# Patient Record
Sex: Male | Born: 1970
Health system: Southern US, Community
[De-identification: ages and names within clinical notes are randomized; demographics above are authoritative.]

## PROBLEM LIST (undated history)

## (undated) DIAGNOSIS — F840 Autistic disorder: Secondary | ICD-10-CM

## (undated) DIAGNOSIS — F419 Anxiety disorder, unspecified: Secondary | ICD-10-CM

## (undated) DIAGNOSIS — F32A Depression, unspecified: Secondary | ICD-10-CM

## (undated) DIAGNOSIS — C801 Malignant (primary) neoplasm, unspecified: Secondary | ICD-10-CM

## (undated) DIAGNOSIS — F319 Bipolar disorder, unspecified: Secondary | ICD-10-CM

## (undated) HISTORY — DX: Anxiety disorder, unspecified: F41.9

## (undated) HISTORY — DX: Bipolar disorder, unspecified: F31.9

## (undated) HISTORY — PX: EYE SURGERY: SHX253

## (undated) HISTORY — DX: Malignant (primary) neoplasm, unspecified: C80.1

## (undated) HISTORY — DX: Depression, unspecified: F32.A

---

## 2009-02-21 ENCOUNTER — Ambulatory Visit: Payer: Self-pay | Admitting: Urology

## 2018-05-06 ENCOUNTER — Emergency Department (HOSPITAL_COMMUNITY)
Admission: EM | Admit: 2018-05-06 | Discharge: 2018-05-07 | Disposition: A | Discharge status: Home / Self Care | Payer: PRIVATE HEALTH INSURANCE | Attending: Emergency Medicine | Admitting: Emergency Medicine

## 2018-05-06 ENCOUNTER — Encounter (HOSPITAL_COMMUNITY): Payer: Self-pay | Admitting: Emergency Medicine

## 2018-05-06 ENCOUNTER — Other Ambulatory Visit: Payer: Self-pay

## 2018-05-06 DIAGNOSIS — F840 Autistic disorder: Secondary | ICD-10-CM | POA: Diagnosis not present

## 2018-05-06 DIAGNOSIS — R45851 Suicidal ideations: Secondary | ICD-10-CM | POA: Insufficient documentation

## 2018-05-06 DIAGNOSIS — F22 Delusional disorders: Secondary | ICD-10-CM | POA: Diagnosis present

## 2018-05-06 DIAGNOSIS — G4700 Insomnia, unspecified: Secondary | ICD-10-CM | POA: Insufficient documentation

## 2018-05-06 DIAGNOSIS — F4325 Adjustment disorder with mixed disturbance of emotions and conduct: Secondary | ICD-10-CM | POA: Insufficient documentation

## 2018-05-06 DIAGNOSIS — F312 Bipolar disorder, current episode manic severe with psychotic features: Secondary | ICD-10-CM | POA: Diagnosis present

## 2018-05-06 HISTORY — DX: Autistic disorder: F84.0

## 2018-05-06 LAB — COMPREHENSIVE METABOLIC PANEL
ALT: 38 U/L (ref 17–63)
AST: 35 U/L (ref 15–41)
Albumin: 4 g/dL (ref 3.5–5.0)
Alkaline Phosphatase: 68 U/L (ref 38–126)
Anion gap: 10 (ref 5–15)
BUN: 14 mg/dL (ref 6–20)
CO2: 24 mmol/L (ref 22–32)
Calcium: 8.4 mg/dL — ABNORMAL LOW (ref 8.9–10.3)
Chloride: 104 mmol/L (ref 101–111)
Creatinine, Ser: 0.89 mg/dL (ref 0.61–1.24)
GFR calc Af Amer: >60 mL/min (ref >60)
GFR calc non Af Amer: >60 mL/min (ref >60)
Glucose, Bld: 159 mg/dL — ABNORMAL HIGH (ref 65–99)
Potassium: 3.1 mmol/L — ABNORMAL LOW (ref 3.5–5.1)
Sodium: 138 mmol/L (ref 135–145)
Total Bilirubin: 1 mg/dL (ref 0.3–1.2)
Total Protein: 6.8 g/dL (ref 6.5–8.1)

## 2018-05-06 LAB — SALICYLATE LEVEL: Salicylate Lvl: <7 mg/dL (ref 2.8–30.0)

## 2018-05-06 LAB — CBC
HCT: 43.2 % (ref 39.0–52.0)
Hemoglobin: 14.6 g/dL (ref 13.0–17.0)
MCH: 29.3 pg (ref 26.0–34.0)
MCHC: 33.8 g/dL (ref 30.0–36.0)
MCV: 86.6 fL (ref 78.0–100.0)
Platelets: 163 10*3/uL (ref 150–400)
RBC: 4.99 MIL/uL (ref 4.22–5.81)
RDW: 12.9 % (ref 11.5–15.5)
WBC: 12.3 10*3/uL — ABNORMAL HIGH (ref 4.0–10.5)

## 2018-05-06 LAB — RAPID URINE DRUG SCREEN, HOSP PERFORMED
Amphetamines: NOT DETECTED
Barbiturates: NOT DETECTED
Benzodiazepines: NOT DETECTED
Cocaine: NOT DETECTED
Opiates: NOT DETECTED
Tetrahydrocannabinol: NOT DETECTED

## 2018-05-06 LAB — CK: Total CK: 291 U/L (ref 49–397)

## 2018-05-06 LAB — ACETAMINOPHEN LEVEL: Acetaminophen (Tylenol), Serum: <10 ug/mL — ABNORMAL LOW (ref 10–30)

## 2018-05-06 LAB — ETHANOL: Alcohol, Ethyl (B): <10 mg/dL (ref <10)

## 2018-05-06 MED ORDER — HYDROXYZINE HCL 25 MG PO TABS
25.0000 mg | ORAL_TABLET | Freq: Four times a day (QID) | ORAL | Status: DC | PRN
  Filled 2018-05-06: qty 1

## 2018-05-06 MED ORDER — OLANZAPINE 10 MG PO TABS: 10.0000 mg | ORAL_TABLET | Freq: Every day | ORAL | Status: DC

## 2018-05-06 MED ORDER — QUETIAPINE FUMARATE 100 MG PO TABS
100.0000 mg | ORAL_TABLET | Freq: Every day | ORAL | Status: DC
  Administered 2018-05-06: 100 mg via ORAL
  Filled 2018-05-06: qty 1

## 2018-05-06 MED ORDER — LORAZEPAM 1 MG PO TABS: 1.0000 mg | ORAL_TABLET | Freq: Once | ORAL | Status: DC

## 2018-05-06 MED ORDER — DIPHENHYDRAMINE HCL 25 MG PO CAPS
25.0000 mg | ORAL_CAPSULE | Freq: Once | ORAL | Status: AC
  Administered 2018-05-06: 25 mg via ORAL
  Filled 2018-05-06: qty 1

## 2018-05-06 NOTE — BH Assessment (Signed)
Tele Assessment Note   Patient Name: Steven Knight MRN: [id number] Referring Physician: Tanna Furry, MD Location of [facility]: [facility] Location of Provider: [place] is an 47 y.o. male present to [facility] accompanied by his wife and step-daughter with completes of insomnia and autistic cognitive regression. Past two weeks patient has only been sleeping in 1 or 2 hour increments within 24 hours. Stressors include recently learning after being on a new job for 3 months he has been doing it wrong. Patient was recently re-trained. His step-daughter graduating for [employer] this month and other daughter in [place]. Patient tearful has he discussed his children are now smarter than him and he feels they do not need him. Patient wife report the following symptoms of autistic cognitive regression patient has been displaying which includes; deteriorating executive functions which includes attention, problem solving, working memory, abstract thinking, monitoring internal and external stimuli and decision making. Patient ability to socialize with others his age are also decreasing or being able to cognitively express himself where others understand. Patient unaware that his behavior is a problem for others. Patient has slowly been regressing over the past year after he stopped taking his Klonopin.   Patient denies suicidal / homicidal ideations, auditory / visual hallucinations. Patient denies substance abuse, criminal history, inpatient or outpatient hospitalizations. Patient denies sees a therapist or a psychiatrist. Patient primary care doctor monitors his medication. Patient is expressing depression triggered by his autistic cognitive regression.   Diagnosis: F33.1 Major depressive disorder, Recurrent episode, Moderate  F84.0   Autism spectrum disorder via history    Past Medical History:  Past Medical History:  Diagnosis Date  . Autism      History reviewed. No pertinent surgical history.  Family History: No family history on file.  Social History:  has no tobacco, alcohol, and drug history on file.  Additional Social History:  Alcohol / Drug Use Pain Medications: see MAR Prescriptions: see MAR Over the Counter: see MAR History of alcohol / drug use?: No history of alcohol / drug abuse Longest period of sobriety (when/how long): NA  CIWA: CIWA-Ar BP: (!) 171/108 Pulse Rate: (!) 108 COWS:    Allergies: No Known Allergies  Home Medications:  (Not in a hospital admission)  OB/GYN Status:  No LMP for male patient.  General Assessment Data [facility] of [facility]: [facility] TTS Assessment: In system Is this a Tele or Face-to-Face Assessment?: Face-to-Face Is this an Initial Assessment or a Re-assessment for this encounter?: Initial Assessment Marital status: Married [place] name: N/A Is patient pregnant?: No Pregnancy Status: No Living Arrangements: Spouse/significant other Can pt return to current living arrangement?: Yes Admission Status: Voluntary Is patient capable of signing voluntary admission?: Yes Referral Source: Self/Family/Friend Insurance type: Building services engineer Care Plan Living Arrangements: Spouse/significant other Legal Guardian: Other:(self) Name of Psychiatrist: PCP - [place] Name of Therapist: pt denies   Education Status Is patient currently in school?: No Is the patient employed, unemployed or receiving disability?: Employed  Risk to self with the past 6 months Suicidal Ideation: No Has patient been a risk to self within the past 6 months prior to admission? : No Suicidal Intent: No Has patient had any suicidal intent within the past 6 months prior to admission? : No Is patient at risk for suicide?: No Suicidal Plan?: No Has patient had any suicidal plan within the past 6 months prior to admission? : No  Access to Means: No What has been your  use of drugs/alcohol within the last 12 months?: NA Previous Attempts/Gestures: No How many times?: 0(pt denies) Other Self Harm Risks: none report Triggers for Past Attempts: None known Intentional Self Injurious Behavior: None Family Suicide History: No Recent stressful life event(s): Other (Comment)(Learning New Job) Persecutory voices/beliefs?: No Depression: Yes Depression Symptoms: Despondent, Insomnia Substance abuse history and/or treatment for substance abuse?: No Suicide prevention information given to non-admitted patients: Not applicable  Risk to Others within the past 6 months Homicidal Ideation: No Does patient have any lifetime risk of violence toward others beyond the six months prior to admission? : No Thoughts of Harm to Others: No Current Homicidal Intent: No Current Homicidal Plan: No Access to Homicidal Means: No Identified Victim: N/A History of harm to others?: No Assessment of Violence: None Noted Violent Behavior Description: none known Does patient have access to weapons?: No Criminal Charges Pending?: No Does patient have a court date: No Is patient on probation?: No  Psychosis Hallucinations: None noted(pt denies ) Delusions: None noted  Mental Status Report Appearance/Hygiene: Unremarkable Eye Contact: Fair Motor Activity: Freedom of movement Speech: Logical/coherent Level of Consciousness: Other (Comment)(having hard time cognitively processing at times) Mood: Anxious, Pleasant Affect: Anxious Anxiety Level: Moderate Thought Processes: Thought Blocking Judgement: Partial Orientation: Person, Place, Time Obsessive Compulsive Thoughts/Behaviors: None  Cognitive Functioning Concentration: Decreased Memory: Recent Impaired, Remote Impaired Is patient IDD: No Is patient DD?: No Insight: Poor Impulse Control: Fair Appetite: Fair Have you had any weight changes? : No Change Sleep: Decreased Total Hours of Sleep: 2(family report pt  sleeping 1 or 2 hours in a 24 hours) Vegetative Symptoms: None  ADLScreening [facility] Assessment Services) Patient's cognitive ability adequate to safely complete daily activities?: Yes Patient able to express need for assistance with ADLs?: Yes Independently performs ADLs?: Yes (appropriate for developmental age)  Prior Inpatient Therapy Prior Inpatient Therapy: No  Prior Outpatient Therapy Prior Outpatient Therapy: No Does patient have an ACCT team?: No Does patient have Intensive In-House Services?  : No Does patient have [facility] services? : No Does patient have P4CC services?: No  ADL Screening (condition at time of admission) Patient's cognitive ability adequate to safely complete daily activities?: Yes Is the patient deaf or have difficulty hearing?: No Does the patient have difficulty seeing, even when wearing glasses/contacts?: No Does the patient have difficulty concentrating, remembering, or making decisions?: No Patient able to express need for assistance with ADLs?: Yes Does the patient have difficulty dressing or bathing?: No Independently performs ADLs?: Yes (appropriate for developmental age) Does the patient have difficulty walking or climbing stairs?: No       Abuse/Neglect Assessment (Assessment to be complete while patient is alone) Abuse/Neglect Assessment Can Be Completed: Yes Physical Abuse: Denies Verbal Abuse: Denies Sexual Abuse: Denies Exploitation of patient/patient's resources: Denies Self-Neglect: Denies     Regulatory affairs officer (For Healthcare) Does Patient Have a Medical Advance Directive?: No Would patient like information on creating a medical advance directive?: No - Patient declined          Disposition:  Disposition Initial Assessment Completed for this Encounter: Yes(Dr. Benld, observation, re-evaluate in the a.m. )  This service was provided via telemedicine using a 2-way, interactive audio and Radiographer, therapeutic.  Names of  all persons participating in this telemedicine service and their role in this encounter. Name: Jerell Demery Role: wife   Despina Hidden 05/06/2018 1:26 PM

## 2018-05-06 NOTE — ED Notes (Signed)
Urine culture sent along with urine sample

## 2018-05-06 NOTE — ED Notes (Signed)
Bed: WTR7 Expected date:  Expected time:  Means of arrival:  Comments: 

## 2018-05-06 NOTE — ED Notes (Signed)
Bed: OI75 Expected date:  Expected time:  Means of arrival:  Comments: Triage 6

## 2018-05-06 NOTE — ED Notes (Signed)
Bed: WTR6 Expected date:  Expected time:  Means of arrival:  Comments: 

## 2018-05-06 NOTE — ED Notes (Addendum)
Per wife pt insomnia for several days, paranoid, "need to be in self defense." Denies SI/HI.

## 2018-05-06 NOTE — ED Notes (Signed)
WIFE-NITA [phone]

## 2018-05-07 ENCOUNTER — Encounter: Payer: Self-pay | Admitting: *Deleted

## 2018-05-07 DIAGNOSIS — R45851 Suicidal ideations: Secondary | ICD-10-CM | POA: Insufficient documentation

## 2018-05-07 DIAGNOSIS — F840 Autistic disorder: Secondary | ICD-10-CM | POA: Insufficient documentation

## 2018-05-07 DIAGNOSIS — F329 Major depressive disorder, single episode, unspecified: Secondary | ICD-10-CM | POA: Diagnosis present

## 2018-05-07 DIAGNOSIS — G4700 Insomnia, unspecified: Secondary | ICD-10-CM

## 2018-05-07 DIAGNOSIS — F4325 Adjustment disorder with mixed disturbance of emotions and conduct: Secondary | ICD-10-CM

## 2018-05-07 DIAGNOSIS — Z046 Encounter for general psychiatric examination, requested by authority: Secondary | ICD-10-CM | POA: Diagnosis not present

## 2018-05-07 DIAGNOSIS — R4182 Altered mental status, unspecified: Secondary | ICD-10-CM | POA: Insufficient documentation

## 2018-05-07 DIAGNOSIS — F331 Major depressive disorder, recurrent, moderate: Secondary | ICD-10-CM | POA: Diagnosis not present

## 2018-05-07 DIAGNOSIS — F419 Anxiety disorder, unspecified: Secondary | ICD-10-CM

## 2018-05-07 DIAGNOSIS — F312 Bipolar disorder, current episode manic severe with psychotic features: Secondary | ICD-10-CM | POA: Diagnosis present

## 2018-05-07 LAB — CBC
HCT: 41.3 % (ref 40.0–52.0)
Hemoglobin: 14.5 g/dL (ref 13.0–18.0)
MCH: 29.5 pg (ref 26.0–34.0)
MCHC: 35.2 g/dL (ref 32.0–36.0)
MCV: 83.9 fL (ref 80.0–100.0)
Platelets: 138 10*3/uL — ABNORMAL LOW (ref 150–440)
RBC: 4.92 MIL/uL (ref 4.40–5.90)
RDW: 13.3 % (ref 11.5–14.5)
WBC: 11 10*3/uL — ABNORMAL HIGH (ref 3.8–10.6)

## 2018-05-07 LAB — ETHANOL: Alcohol, Ethyl (B): <10 mg/dL (ref <10)

## 2018-05-07 LAB — COMPREHENSIVE METABOLIC PANEL
ALT: 32 U/L (ref 17–63)
AST: 24 U/L (ref 15–41)
Albumin: 4.2 g/dL (ref 3.5–5.0)
Alkaline Phosphatase: 69 U/L (ref 38–126)
Anion gap: 8 (ref 5–15)
BUN: 17 mg/dL (ref 6–20)
CO2: 29 mmol/L (ref 22–32)
Calcium: 8.7 mg/dL — ABNORMAL LOW (ref 8.9–10.3)
Chloride: 102 mmol/L (ref 101–111)
Creatinine, Ser: 0.94 mg/dL (ref 0.61–1.24)
GFR calc Af Amer: >60 mL/min (ref >60)
GFR calc non Af Amer: >60 mL/min (ref >60)
Glucose, Bld: 95 mg/dL (ref 65–99)
Potassium: 3.9 mmol/L (ref 3.5–5.1)
Sodium: 139 mmol/L (ref 135–145)
Total Bilirubin: 0.6 mg/dL (ref 0.3–1.2)
Total Protein: 7.1 g/dL (ref 6.5–8.1)

## 2018-05-07 MED ORDER — TRAZODONE HCL 50 MG PO TABS: 50.0000 mg | ORAL_TABLET | Freq: Every evening | ORAL | 0 refills | Status: DC | PRN

## 2018-05-07 MED ORDER — PAROXETINE HCL 20 MG PO TABS: 20.0000 mg | ORAL_TABLET | Freq: Every day | ORAL | 0 refills | Status: DC

## 2018-05-07 MED ORDER — CLONAZEPAM 0.5 MG PO TABS: 0.5000 mg | ORAL_TABLET | Freq: Two times a day (BID) | ORAL | 0 refills | Status: DC | PRN

## 2018-05-07 MED ORDER — TRAZODONE HCL 50 MG PO TABS: 50.0000 mg | ORAL_TABLET | Freq: Every evening | ORAL | Status: DC | PRN

## 2018-05-07 MED ORDER — LORAZEPAM 1 MG PO TABS
1.0000 mg | ORAL_TABLET | Freq: Once | ORAL | Status: AC
Start: 2018-05-07 — End: 2018-05-07
  Administered 2018-05-07: 1 mg via ORAL
  Filled 2018-05-07: qty 1

## 2018-05-07 NOTE — Discharge Instructions (Signed)
For your behavioral health needs, you are advised to follow up with one of the following providers.  All of them offer both psychiatry/medication management, as well as counseling.  Contact them at your earliest opportunity to ask about scheduling an intake appointment:       [facility]      [address], [place], [place] [address]      [phone]       [facility] [place] [place], [place] [address]      [phone]       [facility]      [address] [place],  [address]      [phone]

## 2018-05-07 NOTE — ED Triage Notes (Signed)
Wife states that the patient also has been urinating frequently

## 2018-05-07 NOTE — BH Assessment (Signed)
[facility] Assessment Progress Note  Per Buford Dresser, DO, this pt does not require psychiatric hospitalization at this time.  Pt is to be discharged from [facility] with recommendation to follow up with outpatient psychiatry and counseling.  This Probation officer spoke to pt and pt's adult daughter, who remained in the room with pt's verbal consent.  I offered to schedule an appointment for the pt, but they would prefer referral information to follow up at their own initiative.  Discharge instructions include referral information for the [facility], for the [facility], and for [facility].  Pt's nurse, Lala Lund, has been notified.  Jalene Mullet, [place] Triage Specialist [phone]

## 2018-05-07 NOTE — Consult Note (Signed)
[facility] Psych ED Discharge  05/07/2018 12:57 PM Steven Knight  MRN:  [id number] Principal Problem: Adjustment disorder with mixed disturbance of emotions and conduct Discharge Diagnoses:  Patient Active Problem List   Diagnosis Date Noted  . Adjustment disorder with mixed disturbance of emotions and conduct [F43.25]     Subjective:  Steven Knight was admitted to the hospital due to bizarre and childlike behaviors in the setting of work stressors. He has a history of autism spectrum disorder and reports that he does not do well with changes. He reports that he has been working at [employer] for 90 days. He recently found out that he was performing his job duties incorrectly although he was performing them by the way he was trained. He also reports a lapse in his health insurance since starting this job. His stepdaughter was at bedside with his consent. She reports that he has not slept for 2 weeks was "talking to walls" and wetting the bed. He slept for about 3 hours overnight with Seroquel. He has not followed up with his PCP, Dr. Cletis Athens since he did not want to miss work. He is unsure when he last had his medications. He was previously taking Paxil 20 mg daily and Klonopin 0.5 mg BID PRN.   Total Time spent with patient: 45 minutes  Past Psychiatric History: Autism spectrum disorder  Past Medical History:  Past Medical History:  Diagnosis Date  . Autism    History reviewed. No pertinent surgical history. Family History: No family history on file. Family Psychiatric  History: Unknown  Social History:  Social History   Substance and Sexual Activity  Alcohol Use Not on file    Social History   Substance and Sexual Activity  Drug Use Not on file   Social History   Socioeconomic History  . Marital status: Married    Spouse name: Not on file  . Number of children: Not on file  . Years of education: Not on file  . Highest education level: Not on file  Occupational History   . Not on file  Social Needs  . Financial resource strain: Not on file  . Food insecurity:    Worry: Not on file    Inability: Not on file  . Transportation needs:    Medical: Not on file    Non-medical: Not on file  Tobacco Use  . Smoking status: Not on file  Substance and Sexual Activity  . Alcohol use: Not on file  . Drug use: Not on file  . Sexual activity: Not on file  Lifestyle  . Physical activity:    Days per week: Not on file    Minutes per session: Not on file  . Stress: Not on file  Relationships  . Social connections:    Talks on phone: Not on file    Gets together: Not on file    Attends religious service: Not on file    Active member of club or organization: Not on file    Attends meetings of clubs or organizations: Not on file    Relationship status: Not on file  Other Topics Concern  . Not on file  Social History Narrative  . Not on file    Has this patient used any form of tobacco in the last 30 days? (Cigarettes, Smokeless Tobacco, Cigars, and/or Pipes) Prescription not provided because: N/A  Current Medications: Current Facility-Administered Medications  Medication Dose Route Frequency Provider Last Rate Last Dose  . hydrOXYzine (ATARAX/VISTARIL)  tablet 25 mg  25 mg Oral Q6H PRN Patrecia Pour, NP      . QUEtiapine (SEROQUEL) tablet 100 mg  100 mg Oral QHS Patrecia Pour, NP   100 mg at 05/06/18 2122  . traZODone (DESYREL) tablet 50 mg  50 mg Oral QHS PRN Faythe Dingwall, DO       Current Outpatient Medications  Medication Sig Dispense Refill  . PARoxetine (PAXIL) 20 MG tablet Take 20 mg by mouth every evening.   10  . clonazePAM (KLONOPIN) 0.5 MG tablet Take 0.5 mg by mouth 2 (two) times daily.  1  . clonazePAM (KLONOPIN) 0.5 MG tablet Take 1 tablet (0.5 mg total) by mouth 2 (two) times daily as needed for anxiety. 14 tablet 0  . PARoxetine (PAXIL) 20 MG tablet Take 1 tablet (20 mg total) by mouth daily for 14 days. 14 tablet 0  . traZODone  (DESYREL) 50 MG tablet Take 1 tablet (50 mg total) by mouth at bedtime as needed for sleep. 14 tablet 0   PTA Medications:  (Not in a hospital admission)  Musculoskeletal: Strength & Muscle Tone: within normal limits Gait & Station: normal Patient leans: N/A  Psychiatric Specialty Exam: Physical Exam  Nursing note and vitals reviewed. Constitutional: He is oriented to person, place, and time. He appears well-developed and well-nourished.  HENT:  Head: Normocephalic and atraumatic.  Neck: Normal range of motion.  Respiratory: Effort normal.  Musculoskeletal: Normal range of motion.  Neurological: He is alert and oriented to person, place, and time.  Skin: No rash noted.  Psychiatric: He has a normal mood and affect. His speech is normal and behavior is normal. Judgment and thought content normal. Cognition and memory are normal.    Review of Systems  Psychiatric/Behavioral: Negative for hallucinations, substance abuse and suicidal ideas. The patient is nervous/anxious and has insomnia.   All other systems reviewed and are negative.   Blood pressure 137/86, pulse 84, temperature 98.6 F (37 C), temperature source Oral, resp. rate 18, height 6' 2"  (1.88 m), weight 76.7 kg (169 lb), SpO2 99 %.Body mass index is 21.7 kg/m.  General Appearance: Fairly Groomed, middle aged, Caucasian male, wearing paper hospital scrubs and lying in bed. NAD.   Eye Contact:  Good  Speech:  Clear and Coherent and Normal Rate  Volume:  Normal  Mood:  Anxious  Affect:  Congruent  Thought Process:  Goal Directed, Linear and Descriptions of Associations: Intact  Orientation:  Full (Time, Place, and Person)  Thought Content:  Logical  Suicidal Thoughts:  No  Homicidal Thoughts:  No  Memory:  Immediate;   Good Recent;   Good Remote;   Good  Judgement:  Fair  Insight:  Fair  Psychomotor Activity:  Normal  Concentration:  Concentration: Good and Attention Span: Good  Recall:  Good  Fund of Knowledge:   Good  Language:  Good  Akathisia:  No  Handed:  Right  AIMS (if indicated):   N/A  Assets:  Communication Skills Desire for Improvement Financial Resources/Insurance Housing Intimacy Social Support  ADL's:  Intact  Cognition:  WNL  Sleep:   N/A   Assessment:  Steven Knight is a 47 y.o. male who was admitted to the hospital with bizarre and childlike behaviors in the setting of work stressors. He denies current SI, HI and AVH and does not appear to be responding to internal stimuli. Prescriptions for home medications will be provided to treat anxiety and he will be  provided with outpatient resources to follow up with a psychiatrist and therapist.   Demographic Factors:  Male and Caucasian  Loss Factors: NA  Historical Factors: NA  Risk Reduction Factors:   Sense of responsibility to family and Positive social support  Continued Clinical Symptoms:  Previous Psychiatric Diagnoses and Treatments  Cognitive Features That Contribute To Risk:  Thought constriction (tunnel vision)    Suicide Risk:  Minimal: No identifiable suicidal ideation.  Patients presenting with no risk factors but with morbid ruminations; may be classified as minimal risk based on the severity of the depressive symptoms    Plan Of Care/Follow-up recommendations:  -Provided a short term supply of Paxil 20 mg daily for anxiety and Klonopin 0.5 mg BID PRN for anxiety. -Start Trazodone 50 mg qhs PRN for insomnia. -Patient provided with resources for outpatient psychiatrists for further medication management.   Disposition: Discharge home Faythe Dingwall, DO 05/07/2018, 12:57 PM

## 2018-05-07 NOTE — ED Triage Notes (Signed)
Pt says he is unsure why he is here, says he thinks that he is here for anxiety, he denies SI or HI to me. Flighted thoughts in triage, difficult to answer questions.  Pt wife says that he was at [facility] and d/c from there. Pt wife states that pt was asking for a gun from his brother in law about an hour ago because he wanted to hurt himself. Pt has taken his Trazadone and Klonopin.

## 2018-05-07 NOTE — ED Notes (Addendum)
While preparing to change pt to scrubs, the patient became very anxious, stating he was not going to do that then ran out of the room and said he was leaving, he was then running out of the triage door and in triage wanting to talk to his wife because he wanted to leave. Pt was not directable at this time. Dr. Beather Arbour notified, papers drawn up for emergency commitment. Pt wife was able to direct him to change his clothes and blood was able to be drawn. She states that the patient was seen at [facility] yesterday and evaluated by psych, d/c with meds. Her number is listed below for questions.   [place] pt spouse can be contacted at [phone]

## 2018-05-07 NOTE — ED Notes (Signed)
Patient awake and pacing. Medicated for restlessness

## 2018-05-08 ENCOUNTER — Emergency Department: Payer: PRIVATE HEALTH INSURANCE

## 2018-05-08 ENCOUNTER — Emergency Department
Admission: EM | Admit: 2018-05-08 | Discharge: 2018-05-08 | Disposition: A | Discharge status: Other Acute Inpatient Hospital | Payer: PRIVATE HEALTH INSURANCE | Attending: Emergency Medicine | Admitting: Emergency Medicine

## 2018-05-08 ENCOUNTER — Encounter: Payer: Self-pay | Admitting: Psychiatry

## 2018-05-08 ENCOUNTER — Other Ambulatory Visit: Payer: Self-pay

## 2018-05-08 ENCOUNTER — Inpatient Hospital Stay
Admission: AD | Admit: 2018-05-08 | Discharge: 2018-05-19 | DRG: 885 | Disposition: A | Discharge status: Home / Self Care | Payer: BLUE CROSS/BLUE SHIELD | Attending: Psychiatry | Admitting: Psychiatry

## 2018-05-08 ENCOUNTER — Encounter: Payer: Self-pay | Admitting: *Deleted

## 2018-05-08 DIAGNOSIS — Y92230 Patient room in hospital as the place of occurrence of the external cause: Secondary | ICD-10-CM | POA: Diagnosis not present

## 2018-05-08 DIAGNOSIS — R45851 Suicidal ideations: Secondary | ICD-10-CM | POA: Diagnosis present

## 2018-05-08 DIAGNOSIS — K117 Disturbances of salivary secretion: Secondary | ICD-10-CM | POA: Diagnosis not present

## 2018-05-08 DIAGNOSIS — H538 Other visual disturbances: Secondary | ICD-10-CM | POA: Diagnosis not present

## 2018-05-08 DIAGNOSIS — F312 Bipolar disorder, current episode manic severe with psychotic features: Principal | ICD-10-CM | POA: Diagnosis present

## 2018-05-08 DIAGNOSIS — T43595A Adverse effect of other antipsychotics and neuroleptics, initial encounter: Secondary | ICD-10-CM | POA: Diagnosis not present

## 2018-05-08 DIAGNOSIS — G4700 Insomnia, unspecified: Secondary | ICD-10-CM | POA: Diagnosis present

## 2018-05-08 DIAGNOSIS — Z79899 Other long term (current) drug therapy: Secondary | ICD-10-CM | POA: Diagnosis not present

## 2018-05-08 DIAGNOSIS — F331 Major depressive disorder, recurrent, moderate: Secondary | ICD-10-CM

## 2018-05-08 DIAGNOSIS — T148XXA Other injury of unspecified body region, initial encounter: Secondary | ICD-10-CM | POA: Diagnosis present

## 2018-05-08 DIAGNOSIS — F333 Major depressive disorder, recurrent, severe with psychotic symptoms: Secondary | ICD-10-CM

## 2018-05-08 DIAGNOSIS — F840 Autistic disorder: Secondary | ICD-10-CM | POA: Diagnosis present

## 2018-05-08 DIAGNOSIS — Z599 Problem related to housing and economic circumstances, unspecified: Secondary | ICD-10-CM

## 2018-05-08 DIAGNOSIS — F419 Anxiety disorder, unspecified: Secondary | ICD-10-CM | POA: Diagnosis present

## 2018-05-08 LAB — URINALYSIS, COMPLETE (UACMP) WITH MICROSCOPIC
Bacteria, UA: NONE SEEN
Bilirubin Urine: NEGATIVE
Glucose, UA: NEGATIVE mg/dL
Hgb urine dipstick: NEGATIVE
Ketones, ur: NEGATIVE mg/dL
Leukocytes, UA: NEGATIVE
Nitrite: NEGATIVE
Protein, ur: NEGATIVE mg/dL
Specific Gravity, Urine: 1.01 (ref 1.005–1.030)
Squamous Epithelial / LPF: NONE SEEN (ref 0–5)
pH: 6 (ref 5.0–8.0)

## 2018-05-08 LAB — VITAMIN B12: Vitamin B-12: 342 pg/mL (ref 180–914)

## 2018-05-08 LAB — URINE DRUG SCREEN, QUALITATIVE (ARMC ONLY)
Amphetamines, Ur Screen: NOT DETECTED
Barbiturates, Ur Screen: NOT DETECTED
Benzodiazepine, Ur Scrn: NOT DETECTED
Cannabinoid 50 Ng, Ur ~~LOC~~: NOT DETECTED
Cocaine Metabolite,Ur ~~LOC~~: NOT DETECTED
MDMA (Ecstasy)Ur Screen: NOT DETECTED
Methadone Scn, Ur: NOT DETECTED
Opiate, Ur Screen: NOT DETECTED
Phencyclidine (PCP) Ur S: NOT DETECTED
Tricyclic, Ur Screen: NOT DETECTED

## 2018-05-08 LAB — TSH: TSH: 2.108 u[IU]/mL (ref 0.350–4.500)

## 2018-05-08 LAB — FOLATE: Folate: 11.8 ng/mL (ref >5.9)

## 2018-05-08 LAB — ACETAMINOPHEN LEVEL: Acetaminophen (Tylenol), Serum: <10 ug/mL — ABNORMAL LOW (ref 10–30)

## 2018-05-08 LAB — SALICYLATE LEVEL: Salicylate Lvl: <7 mg/dL (ref 2.8–30.0)

## 2018-05-08 MED ORDER — MAGNESIUM HYDROXIDE 400 MG/5ML PO SUSP: 30.0000 mL | Freq: Every day | ORAL | Status: DC | PRN

## 2018-05-08 MED ORDER — TRAZODONE HCL 50 MG PO TABS
50.0000 mg | ORAL_TABLET | Freq: Every evening | ORAL | Status: DC | PRN
Start: 2018-05-08 — End: 2018-05-08

## 2018-05-08 MED ORDER — OLANZAPINE 10 MG PO TABS: 10.0000 mg | ORAL_TABLET | Freq: Every day | ORAL | Status: DC

## 2018-05-08 MED ORDER — PAROXETINE HCL 20 MG PO TABS
20.0000 mg | ORAL_TABLET | Freq: Every day | ORAL | Status: DC
  Administered 2018-05-08: 20 mg via ORAL
  Filled 2018-05-08: qty 1

## 2018-05-08 MED ORDER — HYDROXYZINE HCL 25 MG PO TABS
50.0000 mg | ORAL_TABLET | Freq: Four times a day (QID) | ORAL | Status: DC | PRN
  Administered 2018-05-08 (×2): 50 mg via ORAL
  Filled 2018-05-08 (×2): qty 2

## 2018-05-08 MED ORDER — TRAZODONE HCL 50 MG PO TABS: 50.0000 mg | ORAL_TABLET | Freq: Every evening | ORAL | Status: DC | PRN

## 2018-05-08 MED ORDER — OLANZAPINE 10 MG PO TABS
10.0000 mg | ORAL_TABLET | Freq: Every day | ORAL | Status: DC
  Administered 2018-05-08: 10 mg via ORAL
  Filled 2018-05-08: qty 1

## 2018-05-08 MED ORDER — TEMAZEPAM 15 MG PO CAPS
30.0000 mg | ORAL_CAPSULE | Freq: Every day | ORAL | Status: DC
  Administered 2018-05-08 – 2018-05-09 (×2): 30 mg via ORAL
  Filled 2018-05-08 (×2): qty 2

## 2018-05-08 MED ORDER — PAROXETINE HCL 20 MG PO TABS
20.0000 mg | ORAL_TABLET | Freq: Every day | ORAL | Status: DC
  Filled 2018-05-08: qty 1

## 2018-05-08 MED ORDER — HYDROXYZINE HCL 50 MG PO TABS
50.0000 mg | ORAL_TABLET | Freq: Four times a day (QID) | ORAL | Status: DC | PRN
  Administered 2018-05-15 – 2018-05-17 (×3): 50 mg via ORAL
  Filled 2018-05-08 (×3): qty 1

## 2018-05-08 MED ORDER — DIVALPROEX SODIUM 500 MG PO DR TAB
500.0000 mg | DELAYED_RELEASE_TABLET | Freq: Three times a day (TID) | ORAL | Status: DC
  Administered 2018-05-08 – 2018-05-10 (×5): 500 mg via ORAL
  Filled 2018-05-08 (×5): qty 1

## 2018-05-08 MED ORDER — CLONAZEPAM 0.5 MG PO TABS
0.5000 mg | ORAL_TABLET | Freq: Once | ORAL | Status: AC
  Administered 2018-05-08: 0.5 mg via ORAL
  Filled 2018-05-08 (×2): qty 1

## 2018-05-08 MED ORDER — NICOTINE 21 MG/24HR TD PT24
21.0000 mg | MEDICATED_PATCH | Freq: Every day | TRANSDERMAL | Status: DC
  Filled 2018-05-08 (×2): qty 1

## 2018-05-08 MED ORDER — ACETAMINOPHEN 325 MG PO TABS
650.0000 mg | ORAL_TABLET | Freq: Four times a day (QID) | ORAL | Status: DC | PRN
  Administered 2018-05-11: 650 mg via ORAL
  Filled 2018-05-08: qty 2

## 2018-05-08 MED ORDER — ALUM & MAG HYDROXIDE-SIMETH 200-200-20 MG/5ML PO SUSP: 30.0000 mL | ORAL | Status: DC | PRN

## 2018-05-08 NOTE — ED Notes (Signed)
Pt watching TV and playing cards in dayroom. Maintained on 15 minute checks and observation by security camera for safety.

## 2018-05-08 NOTE — ED Notes (Signed)
Pt told he will be admitted to [facility]. Pt accepting. No behavioral issues. Patient asleep in room. No noted distress or abnormal behavior. Will continue 15 minute checks and observation by security cameras for safety.

## 2018-05-08 NOTE — ED Notes (Signed)
Pt became upset when shower supplies and towels had to be taken out of his room. Patient began yelling and threw cup, pillow and blanket into the hall.  Patient re-directed and picked up stuff in hall.   Patient then requesting to call his wife. After call ended patient yelling, again throwing cup and pillow into the hall.  EDP spoke with patient and ordered Klonopin. Patient refused medication.   Maintained on 15 minute checks and observation by security camera for safety.
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
## 2018-05-08 NOTE — ED Notes (Addendum)
Pt being cooperative and calm at this time, asking for his linen to be changed. Pt also states that he is expecting visitors. Pt states that he would like to blood cleaned up off the floor.

## 2018-05-08 NOTE — Progress Notes (Signed)
Nursing note 7p-7a  Pt observed ambulating the halls with minimal interaction with peers on unit this shift. Displayed a flat affect and  anxious mood upon interaction with this Probation officer. Pt required redirection with keeping his nonskid socks on at the start of the shift.  Prior to med pass pt out of room with no clothes on and only a blanket around him. Needed to redirected by this RN and MHT to return to room and put clothes back on. Pt was medicated without issue, see MAR but needed redirection and medication education prior to taking . " this is not the meds Manuela Schwartz wants me to take, if I take all these pill they wont kill me, right". Pt continued to count medications several times prior to taking. Pt proceed back to room sliding body against the wall and holding on to the hand rail.  Pt denies pain ,denies SI/HI, and also denies audio or visual hallucinations at this time.  Pt continues to remain safe on the unit and is observed by rounding every 15 min. Pt able to verbally contract for safety. Pt resting in bed with eyes closed with no signs or symptoms of pain or distress noted. RN will continue to monitor.

## 2018-05-08 NOTE — ED Notes (Signed)
Pt informed that he would be moving back to the [place]. Pt cooperative and already stripped his sheets off his bed.

## 2018-05-08 NOTE — ED Notes (Addendum)
This note was written in ERROR) [place], RN

## 2018-05-08 NOTE — Progress Notes (Signed)
Patient received on unit in scrubs.  Hyperverbal and tangential.  Body search and skin assessment performed.  No contraband found.  Multiple bruises noted.

## 2018-05-08 NOTE — ED Notes (Signed)
Pt crawling from room to bathroom.

## 2018-05-08 NOTE — BH Assessment (Signed)
Patient seen by [facility] and recommended inpatient treatment. Writer spoke with [facility] Attending Physician, Dr. Bary Leriche and she agreed with recommendation and will put in the admission orders.  Patient is to be admitted to [facility] by Dr. Bary Leriche.  Attending Physician will be Dr. Wonda Olds.   Patient has been assigned to room 312, by [place] Nurse [place].   Intake Paper Work has been signed and placed on patient chart.  ER staff is aware of the admission:  [place], ER Sectary   Dr. Jimmye Norman, ER MD   Amy B., Patient's Nurse   Levada Dy, Patient Access.

## 2018-05-08 NOTE — ED Notes (Signed)
Pt speaking with TTS. Maintained on 15 minute checks and observation by security camera for safety.

## 2018-05-08 NOTE — ED Provider Notes (Signed)
[facility] Emergency Department Provider Note   ____________________________________________   First MD Initiated Contact with Patient 05/08/18 0115     (approximate)  I have reviewed the triage vital signs and the nursing notes.   HISTORY  Chief Complaint Panic Attack    HPI Steven Knight is a 47 y.o. male brought to the ED by his wife for depression with suicidal ideation.  Wife states patient was discharged from [facility] long psychiatric facility this afternoon.  Reportedly patient was asking his brother-in-law for a gun because he wanted to hurt himself.  Patient denies SI or HI; thinks he is here for anxiety related issues.  Voices no medical complaints.  Requesting something to help him sleep.   Past Medical History:  Diagnosis Date  . Autism   . Autism     Patient Active Problem List   Diagnosis Date Noted  . Adjustment disorder with mixed disturbance of emotions and conduct     History reviewed. No pertinent surgical history.  Prior to Admission medications   Medication Sig Start Date End Date Taking? Authorizing Provider  clonazePAM (KLONOPIN) 0.5 MG tablet Take 1 tablet (0.5 mg total) by mouth 2 (two) times daily as needed for anxiety. 05/07/18 05/07/19 Yes Faythe Dingwall, DO  PARoxetine (PAXIL) 20 MG tablet Take 1 tablet (20 mg total) by mouth daily for 14 days. 05/07/18 05/21/18 Yes Faythe Dingwall, DO  traZODone (DESYREL) 50 MG tablet Take 1 tablet (50 mg total) by mouth at bedtime as needed for sleep. 05/07/18  Yes Faythe Dingwall, DO    Allergies Patient has no known allergies.  No family history on file.  Social History Social History   Tobacco Use  . Smoking status: Never Smoker  Substance Use Topics  . Alcohol use: Never    Frequency: Never  . Drug use: Never    Review of Systems  Constitutional: No fever/chills Eyes: No visual changes. ENT: No sore throat. Cardiovascular: Denies chest  pain. Respiratory: Denies shortness of breath. Gastrointestinal: No abdominal pain.  No nausea, no vomiting.  No diarrhea.  No constipation. Genitourinary: Negative for dysuria. Musculoskeletal: Negative for back pain. Skin: Negative for rash. Neurological: Negative for headaches, focal weakness or numbness. Psychiatric:Positive for depression.  ____________________________________________   PHYSICAL EXAM:  VITAL SIGNS: ED Triage Vitals  Enc Vitals Group     BP 05/07/18 2242 128/89     Pulse Rate 05/07/18 2242 89     Resp 05/07/18 2242 16     Temp 05/07/18 2242 98 F (36.7 C)     Temp Source 05/07/18 2242 Oral     SpO2 05/07/18 2242 99 %     Weight --      Height --      Head Circumference --      Peak Flow --      Pain Score 05/07/18 2250 0     Pain Loc --      Pain Edu? --      Excl. in [place]? --     Constitutional: Alert and oriented.  Disheveled appearing and in no acute distress. Eyes: Conjunctivae are normal. PERRL. EOMI. Head: Atraumatic. Nose: No congestion/rhinnorhea. Mouth/Throat: Mucous membranes are moist.  Oropharynx non-erythematous. Neck: No stridor.   Cardiovascular: Normal rate, regular rhythm. Grossly normal heart sounds.  Good peripheral circulation. Respiratory: Normal respiratory effort.  No retractions. Lungs CTAB. Gastrointestinal: Soft and nontender. No distention. No abdominal bruits. No CVA tenderness. Musculoskeletal: No lower extremity tenderness  nor edema.  No joint effusions. Neurologic:  Normal speech and language. No gross focal neurologic deficits are appreciated. No gait instability. Skin:  Skin is warm, dry and intact. No rash noted. Psychiatric: Mood and affect are anxious. Speech and behavior are anxious.  ____________________________________________   LABS (all labs ordered are listed, but only abnormal results are displayed)  Labs Reviewed  COMPREHENSIVE METABOLIC PANEL - Abnormal; Notable for the following components:       Result Value   Calcium 8.7 (*)    All other components within normal limits  ACETAMINOPHEN LEVEL - Abnormal; Notable for the following components:   Acetaminophen (Tylenol), Serum <10 (*)    All other components within normal limits  CBC - Abnormal; Notable for the following components:   WBC 11.0 (*)    Platelets 138 (*)    All other components within normal limits  URINALYSIS, COMPLETE (UACMP) WITH MICROSCOPIC - Abnormal; Notable for the following components:   Color, Urine YELLOW (*)    APPearance CLEAR (*)    All other components within normal limits  ETHANOL  SALICYLATE LEVEL  URINE DRUG SCREEN, QUALITATIVE ([facility] ONLY)   ____________________________________________  EKG  None ____________________________________________  RADIOLOGY  ED MD interpretation: None  Official radiology report(s): No results found.  ____________________________________________   PROCEDURES  Procedure(s) performed: None  Procedures  Critical Care performed: No  ____________________________________________   INITIAL IMPRESSION / ASSESSMENT AND PLAN / ED COURSE  As part of my medical decision making, I reviewed the following data within the [place] notes reviewed and incorporated, Labs reviewed, Old chart reviewed, A consult was requested and obtained from this/these consultant(s) Psychiatry and Notes from prior ED visits   47 year old male with depression, autism who presents with depression with reported suicidal ideation.  Patient was attempting to leave the facility after being triaged.  He was placed under emergency IVC for his safety.  Laboratory and urinalysis results unremarkable.  Patient will remain in the ED under IVC pending psychiatric evaluation and disposition.   Clinical Course as of May 08 654  Sat May 08, 2018  0655 Patient had been medicated and was too drowsy to participate in [facility] psychiatry interview.  Will be evaluated this morning  once he is awake.  Remains in the ED under IVC pending psychiatric evaluation and disposition.   [JS]    Clinical Course User Index [JS] [name], MD     ____________________________________________   FINAL CLINICAL IMPRESSION(S) / ED DIAGNOSES  Final diagnoses:  Moderate episode of recurrent major depressive disorder [facility])     ED Discharge Orders    None       Note:  This document was prepared using Dragon voice recognition software and may include unintentional dictation errors.    [name], MD 05/08/18 [phone]

## 2018-05-08 NOTE — ED Notes (Signed)
Pt refusing to take medication states he does not want to talk to anyone unless it is his family. Pt laying on his left side at the foot of the bed.

## 2018-05-08 NOTE — ED Notes (Signed)
Pt has been exercising, jogging and moving chairs in the dayroom.  Maintained on 15 minute checks and observation by security camera for safety.

## 2018-05-08 NOTE — BH Assessment (Signed)
Assessment Note  Steven Knight is an 47 y.o. male who presents to the ER due to a recent onset of bizarre and odd behaviors. Patient was recently seen in [facility] ER for similar behaviors and was discharged with information for outpatient providers. However, when patient returned home, his behaviors continued to worsen and it was reported he called his brother and asked for gun to end his life. When asked about the gun the patient denied that it happened. Patient states he was brought to the ER so his family can sleep. Patient's wife is restricted to a wheelchair and is unable to manage patient with his current behaviors.  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 Per his report, his supervisor rode with him to see what he was doing because it was taking too long to complete his work day. He also reports of been placed on suspension due to an accident. It's unclear as to the order of events due to the patient flow of thoughts  During the interview, the patient was hyper-verbal, unable to remain still and fidgety. At one point he was siting on the floor and then sat back on the bed and back to standing. Throughout the interview he would extended his hand to shake this writer's hand. His speech was tangential and didn't have a steady stream of thought.   Diagnosis: Psychosis  Past Medical History:  Past Medical History:  Diagnosis Date  . Autism   . Autism     History reviewed. No pertinent surgical history.  Family History: History reviewed. No pertinent family history.  Social History:  reports that he has never smoked. He does not have any smokeless tobacco history on file. He reports that he does not drink alcohol or use drugs.  Additional Social History:  Alcohol / Drug Use Pain Medications: See PTA Prescriptions: See PTA Over the Counter: See PTA History of alcohol / drug use?: No history of alcohol / drug abuse Longest period of sobriety (when/how long):  Reports of none Negative Consequences of Use: (n/a) Withdrawal Symptoms: (n/a)  CIWA: CIWA-Ar BP: 127/87 Pulse Rate: 95 COWS:    Allergies: No Known Allergies  Home Medications:  (Not in a hospital admission)  OB/GYN Status:  No LMP for male patient.  General Assessment Data Location of Assessment: [facility] ED TTS Assessment: In system Is this a Tele or Face-to-Face Assessment?: Face-to-Face Is this an Initial Assessment or a Re-assessment for this encounter?: Initial Assessment Marital status: Married [place] name: n/a Is patient pregnant?: No Pregnancy Status: No Living Arrangements: Spouse/significant other Can pt return to current living arrangement?: Yes Admission Status: Involuntary Is patient capable of signing voluntary admission?: No(Under IVC) Referral Source: Self/Family/Friend Insurance type: None  Medical Screening Exam ([place]) Medical Exam completed: Yes  Crisis Care Plan Living Arrangements: Spouse/significant other Legal Guardian: Other:(None) Name of Psychiatrist: PCP - [place] Name of Therapist: pt denies   Education Status Is patient currently in school?: No Is the patient employed, unemployed or receiving disability?: Employed(On Suspension)  Risk to self with the past 6 months Suicidal Ideation: No Has patient been a risk to self within the past 6 months prior to admission? : No Suicidal Intent: No Has patient had any suicidal intent within the past 6 months prior to admission? : No Is patient at risk for suicide?: No Suicidal Plan?: No Has patient had any suicidal plan within the past 6 months prior to admission? :  No Access to Means: No What has been your use of drugs/alcohol within the last 12 months?: Reports of none Previous Attempts/Gestures: No Other Self Harm Risks: Reports of none Triggers for Past Attempts: None known Intentional Self Injurious Behavior: None Family Suicide History: No Recent  stressful life event(s): Other (Comment)(Recent changes in his behaviors) Persecutory voices/beliefs?: No Depression: Yes Depression Symptoms: Insomnia Substance abuse history and/or treatment for substance abuse?: No Suicide prevention information given to non-admitted patients: Not applicable  Risk to Others within the past 6 months Homicidal Ideation: No Does patient have any lifetime risk of violence toward others beyond the six months prior to admission? : No Thoughts of Harm to Others: No Current Homicidal Intent: No Current Homicidal Plan: No Access to Homicidal Means: No Identified Victim: Reports of none History of harm to others?: No Assessment of Violence: None Noted Violent Behavior Description: Reports of none Does patient have access to weapons?: No Criminal Charges Pending?: No Does patient have a court date: No Is patient on probation?: No  Psychosis Hallucinations: None noted Delusions: None noted  Mental Status Report Appearance/Hygiene: Unremarkable, In scrubs Eye Contact: Good Motor Activity: Freedom of movement, Unremarkable Speech: Logical/coherent, Unremarkable Level of Consciousness: Alert Mood: Anxious, Pleasant Affect: Anxious Anxiety Level: Minimal Thought Processes: Irrelevant, Flight of Ideas Judgement: Impaired Orientation: Person Obsessive Compulsive Thoughts/Behaviors: Moderate  Cognitive Functioning Concentration: Decreased Memory: Recent Intact, Remote Intact Is patient IDD: No Is patient DD?: No Insight: Poor Impulse Control: Poor Appetite: Good Have you had any weight changes? : No Change Sleep: Decreased Total Hours of Sleep: 2 Vegetative Symptoms: None  ADLScreening [facility] Assessment Services) Patient's cognitive ability adequate to safely complete daily activities?: Yes Patient able to express need for assistance with ADLs?: Yes Independently performs ADLs?: Yes (appropriate for developmental age)  Prior Inpatient  Therapy Prior Inpatient Therapy: No  Prior Outpatient Therapy Prior Outpatient Therapy: No Does patient have an ACCT team?: No Does patient have Intensive In-House Services?  : No Does patient have [facility] services? : No Does patient have P4CC services?: No  ADL Screening (condition at time of admission) Patient's cognitive ability adequate to safely complete daily activities?: Yes Is the patient deaf or have difficulty hearing?: No Does the patient have difficulty seeing, even when wearing glasses/contacts?: No Does the patient have difficulty concentrating, remembering, or making decisions?: No Patient able to express need for assistance with ADLs?: Yes Does the patient have difficulty dressing or bathing?: No Independently performs ADLs?: Yes (appropriate for developmental age) Does the patient have difficulty walking or climbing stairs?: No Weakness of Legs: None Weakness of Arms/Hands: None  Home Assistive Devices/Equipment Home Assistive Devices/Equipment: None  Therapy Consults (therapy consults require a physician order) PT Evaluation Needed: No OT Evalulation Needed: No SLP Evaluation Needed: No Abuse/Neglect Assessment (Assessment to be complete while patient is alone) Abuse/Neglect Assessment Can Be Completed: Yes Physical Abuse: Denies Verbal Abuse: Denies Sexual Abuse: Denies Exploitation of patient/patient's resources: Denies Self-Neglect: Denies Values / Beliefs Cultural Requests During Hospitalization: None Spiritual Requests During Hospitalization: None Consults Spiritual Care Consult Needed: No Social Work Consult Needed: No         Child/Adolescent Assessment Running Away Risk: Denies(Patient is an adult)  Disposition:  Disposition Initial Assessment Completed for this Encounter: Yes  On Site Evaluation by:   Reviewed with Physician:    Gunnar Fusi MS, LCAS, [place], [place], CCSI Therapeutic Triage Specialist 05/08/2018 5:40 PM

## 2018-05-08 NOTE — BHH Suicide Risk Assessment (Signed)
[facility] Admission Suicide Risk Assessment   Nursing information obtained from:  Patient Demographic factors:  Male Current Mental Status:  NA Loss Factors:  NA Historical Factors:  NA Risk Reduction Factors:  Employed, Living with another person, especially a relative, Sense of responsibility to family, Positive social support, Religious beliefs about death  Total Time spent with patient: 1 hour Principal Problem: Major depressive disorder, recurrent, severe with psychotic features ([place]) Diagnosis:   Patient Active Problem List   Diagnosis Date Noted  . Major depressive disorder, recurrent, severe with psychotic features ([place]) [F33.3]     Priority: High   Subjective Data: suicidal ideation  Continued Clinical Symptoms:  Alcohol Use Disorder Identification Test Final Score (AUDIT): 0 The "Alcohol Use Disorders Identification Test", Guidelines for Use in Primary Care, Second Edition.  World Pharmacologist [facility]). Score between 0-7:  no or low risk or alcohol related problems. Score between 8-15:  moderate risk of alcohol related problems. Score between 16-19:  high risk of alcohol related problems. Score 20 or above:  warrants further diagnostic evaluation for alcohol dependence and treatment.   CLINICAL FACTORS:   Depression:   Impulsivity Insomnia Currently Psychotic   Musculoskeletal: Strength & Muscle Tone: within normal limits Gait & Station: normal Patient leans: N/A  Psychiatric Specialty Exam: Physical Exam  Nursing note and vitals reviewed. Psychiatric: His mood appears anxious. His affect is labile. His speech is rapid and/or pressured. He is hyperactive. Thought content is paranoid and delusional. Cognition and memory are impaired. He expresses impulsivity. He expresses suicidal ideation.    Review of Systems  Neurological: Negative.   Psychiatric/Behavioral: Positive for depression, hallucinations and suicidal ideas. The patient is nervous/anxious and has  insomnia.   All other systems reviewed and are negative.   Blood pressure (!) 133/98, pulse (!) 107, temperature 98.1 F (36.7 C), temperature source Oral, resp. rate 18, height 6' 2"  (1.88 m), weight 76.2 kg (168 lb), SpO2 98 %.Body mass index is 21.57 kg/m.  General Appearance: Fairly Groomed  Eye Contact:  Good  Speech:  Pressured  Volume:  Increased  Mood:  Anxious and Dysphoric  Affect:  Congruent  Thought Process:  Disorganized, Irrelevant and Descriptions of Associations: Loose  Orientation:  Full (Time, Place, and Person)  Thought Content:  Delusions and Paranoid Ideation  Suicidal Thoughts:  Yes.  with intent/plan  Homicidal Thoughts:  No  Memory:  Immediate;   Poor Recent;   Poor Remote;   Poor  Judgement:  Poor  Insight:  Lacking  Psychomotor Activity:  Increased  Concentration:  Concentration: Poor and Attention Span: Poor  Recall:  Poor  Fund of Knowledge:  Poor  Language:  Poor  Akathisia:  No  Handed:  Right  AIMS (if indicated):     Assets:  Desire for Improvement Housing Intimacy Physical Health Resilience Social Support  ADL's:  Intact  Cognition:  WNL  Sleep:         COGNITIVE FEATURES THAT CONTRIBUTE TO RISK:  None    SUICIDE RISK:   Moderate:  Frequent suicidal ideation with limited intensity, and duration, some specificity in terms of plans, no associated intent, good self-control, limited dysphoria/symptomatology, some risk factors present, and identifiable protective factors, including available and accessible social support.  PLAN OF CARE: hospital admission, medicationmanagement, discharge planning.  Mr. Drummonds is a 47 year old male with no past psychiatric history admitted for psychosis, bizarre behaviors and suicidal ideation.  #Mood/psychosis -start Zyprexa 15 mg nightly  #Insomnia Trazodone 100 mg nightly  #  Labs -lipid panel, TSH and A1C -EKG -head Ct scan negative  #Disposition -discharge with family -follow up with new  psychiatrist   I certify that inpatient services furnished can reasonably be expected to improve the patient's condition.   Orson Slick, MD 05/08/2018, 8:03 PM

## 2018-05-08 NOTE — Tx Team (Signed)
Initial Treatment Plan 05/08/2018 7:04 PM Steven Knight [id number]    PATIENT STRESSORS: Traumatic event   PATIENT STRENGTHS: Ability for insight Average or above average intelligence Capable of independent living Communication skills Supportive family/friends   PATIENT IDENTIFIED PROBLEMS: Mania                     DISCHARGE CRITERIA:  Improved stabilization in mood, thinking, and/or behavior  PRELIMINARY DISCHARGE PLAN: Outpatient therapy Return to previous living arrangement  PATIENT/FAMILY INVOLVEMENT: This treatment plan has been presented to and reviewed with the patient, Steven Knight, and/or family member, .  The patient and family have been given the opportunity to ask questions and make suggestions.  Rise Mu, RN 05/08/2018, 7:04 PM

## 2018-05-08 NOTE — ED Notes (Signed)
Pt up using restroom with no assistance

## 2018-05-08 NOTE — ED Notes (Signed)
Spoke with patients wife this morning and she says this behavior is not who he is, she feels that he is not ready to come home, until he is truly assessed. She says she will not take him home today because she feels like he wont be ready to be discharged. Wife stated that he has not slept in a couple of days and she feels this is not just his Autism, she stated that " I feels that he is showing some manic behavior", she asked that she be called when he talks with the [facility]. Informed his nurse.

## 2018-05-08 NOTE — ED Notes (Signed)
Pt discharged to BMU under IVC. VS stable. All belongings sent with patient. Report given to [place], Therapist, sports.  Pt accepting of disposition.

## 2018-05-08 NOTE — Plan of Care (Signed)
Pt continues to need redirection at this time. Will continue to monitor

## 2018-05-08 NOTE — ED Notes (Signed)
Pt up in room at this time running in circles, when pt asked by this tech to sit down pt refuses and states "I wont fall, I've got my socks on"

## 2018-05-09 LAB — LIPID PANEL
Cholesterol: 145 mg/dL (ref 0–200)
HDL: 35 mg/dL — ABNORMAL LOW (ref >40)
LDL Cholesterol: 89 mg/dL (ref 0–99)
Total CHOL/HDL Ratio: 4.1 RATIO
Triglycerides: 104 mg/dL (ref <150)
VLDL: 21 mg/dL (ref 0–40)

## 2018-05-09 LAB — TSH: TSH: 1.557 u[IU]/mL (ref 0.350–4.500)

## 2018-05-09 LAB — HEMOGLOBIN A1C
Hgb A1c MFr Bld: 5.5 % (ref 4.8–5.6)
Mean Plasma Glucose: 111.15 mg/dL

## 2018-05-09 MED ORDER — HALOPERIDOL 5 MG PO TABS
10.0000 mg | ORAL_TABLET | Freq: Four times a day (QID) | ORAL | Status: DC | PRN
  Administered 2018-05-09 – 2018-05-12 (×3): 10 mg via ORAL
  Filled 2018-05-09 (×3): qty 2

## 2018-05-09 MED ORDER — LORAZEPAM 2 MG/ML IJ SOLN: 2.0000 mg | Freq: Four times a day (QID) | INTRAMUSCULAR | Status: DC | PRN

## 2018-05-09 MED ORDER — OLANZAPINE 5 MG PO TABS
15.0000 mg | ORAL_TABLET | Freq: Two times a day (BID) | ORAL | Status: DC
  Administered 2018-05-09 – 2018-05-10 (×2): 15 mg via ORAL
  Filled 2018-05-09 (×2): qty 1

## 2018-05-09 MED ORDER — LORAZEPAM 2 MG PO TABS
2.0000 mg | ORAL_TABLET | Freq: Four times a day (QID) | ORAL | Status: DC | PRN
  Administered 2018-05-09 – 2018-05-16 (×5): 2 mg via ORAL
  Filled 2018-05-09 (×5): qty 1

## 2018-05-09 MED ORDER — DIPHENHYDRAMINE HCL 50 MG/ML IJ SOLN: 50.0000 mg | Freq: Four times a day (QID) | INTRAMUSCULAR | Status: DC | PRN

## 2018-05-09 MED ORDER — HALOPERIDOL LACTATE 5 MG/ML IJ SOLN: 10.0000 mg | Freq: Four times a day (QID) | INTRAMUSCULAR | Status: DC | PRN

## 2018-05-09 MED ORDER — DIPHENHYDRAMINE HCL 25 MG PO CAPS
50.0000 mg | ORAL_CAPSULE | Freq: Four times a day (QID) | ORAL | Status: DC | PRN
  Administered 2018-05-09 – 2018-05-12 (×3): 50 mg via ORAL
  Filled 2018-05-09 (×3): qty 2

## 2018-05-09 NOTE — BHH Suicide Risk Assessment (Signed)
[place] INPATIENT:  Family/Significant Other Suicide Prevention Education  Suicide Prevention Education:  Education Completed; Steven Knight (wife) has been identified by the patient as the family member/significant other with whom the patient will be residing, and identified as the person(s) who will aid the patient in the event of a mental health crisis (suicidal ideations/suicide attempt).  With written consent from the patient, the family member/significant other has been provided the following suicide prevention education, prior to the and/or following the discharge of the patient.  The suicide prevention education provided includes the following:  Suicide risk factors  Suicide prevention and interventions  National Suicide Hotline telephone number  [facility] assessment telephone number  [facility] Emergency Assistance Micro and/or Residential Mobile Crisis Unit telephone number  Request made of family/significant other to:  Remove weapons (e.g., guns, rifles, knives), all items previously/currently identified as safety concern.    Remove drugs/medications (over-the-counter, prescriptions, illicit drugs), all items previously/currently identified as a safety concern.  The family member/significant other verbalizes understanding of the suicide prevention education information provided.  The family member/significant other agrees to remove the items of safety concern listed above.  Neil Brickell  CUEBAS-COLON 05/09/2018, 5:16 PM

## 2018-05-09 NOTE — BHH Group Notes (Signed)
LCSW Group Therapy Note 05/09/2018 1:15pm  Type of Therapy and Topic: Group Therapy: Feelings Around Returning Home & Establishing a Supportive Framework and Supporting Oneself When Supports Not Available  Participation Level: Did Not Attend  Description of Group:  Patients first processed thoughts and feelings about upcoming discharge. These included fears of upcoming changes, lack of change, new living environments, judgements and expectations from others and overall stigma of mental health issues. The group then discussed the definition of a supportive framework, what that looks and feels like, and how do to discern it from an unhealthy non-supportive network. The group identified different types of supports as well as what to do when your family/friends are less than helpful or unavailable  Therapeutic Goals  1. Patient will identify one healthy supportive network that they can use at discharge. 2. Patient will identify one factor of a supportive framework and how to tell it from an unhealthy network. 3. Patient able to identify one coping skill to use when they do not have positive supports from others. 4. Patient will demonstrate ability to communicate their needs through discussion and/or role plays.  Summary of Patient Progress:  Pt was invited to attend group but chose not to attend. CSW will continue to encourage pt to attend group throughout their admission.   Therapeutic Modalities Cognitive Behavioral Therapy Motivational Interviewing   Ifrah Vest  CUEBAS-COLON, LCSW 05/09/2018 1:02 PM

## 2018-05-09 NOTE — BHH Group Notes (Signed)
[place] Group Notes:  (Nursing/MHT/Case Management/Adjunct)  Date:  05/09/2018  Time:  9:07 PM  Type of Therapy:  Group Therapy  Participation Level:  Minimal  Participation Quality:  Drowsy  Affect:  Blunted  Cognitive:  Lacking  Insight:  None  Engagement in Group:  Poor  Modes of Intervention:  Discussion  Summary of Progress/Problems: Nur sat on the floor with eyes closed. [place] introduced himself to group, but soon went to sleep sitting on the floor.  [name] 05/09/2018, 9:07 PM

## 2018-05-09 NOTE — BHH Counselor (Signed)
Adult Comprehensive Assessment  Patient ID: Steven Knight, male   DOB: 15-Jun-1971, 47 y.o.   MRN: [id number]  Information Source: Information source: Patient  Current Stressors:  Patient states their primary concerns and needs for treatment are:: "get back on my meds, they've been out of my system since March" Patient states their goals for this hospitilization and ongoing recovery are:: "I just want to get better" Educational / Learning stressors: none reported Employment / Job issues: none reported Family Relationships: "I am reconnecting with everybodyPublishing copy / Lack of resources (include bankruptcy): employment Housing / Lack of housing: owns house Physical health (include injuries & life threatening diseases): none reported Social relationships: "I like to be by myself" Substance abuse: pt denies substance use Bereavement / Loss: none reported  Living/Environment/Situation:  Living Arrangements: Spouse/significant other Who else lives in the home?: wife and 2 adult children How long has patient lived in current situation?: 10 years ago What is atmosphere in current home: Comfortable, Quarry manager, Supportive  Family History:  Marital status: Married Number of Years Married: 51 Are you sexually active?: No What is your sexual orientation?: heterozexual Has your sexual activity been affected by drugs, alcohol, medication, or emotional stress?: n/a Does patient have children?: Yes How many children?: 2 How is patient's relationship with their children?: 2 stepchildren - relationship is good  Childhood History:  By whom was/is the patient raised?: Both parents Description of patient's relationship with caregiver when they were a child: good Patient's description of current relationship with people who raised him/her: "awesome" How were you disciplined when you got in trouble as a child/adolescent?: spanked, grounded Does patient have siblings?: Yes Number of Siblings:  4 Description of patient's current relationship with siblings: good Did patient suffer any verbal/emotional/physical/sexual abuse as a child?: No Did patient suffer from severe childhood neglect?: No Has patient ever been sexually abused/assaulted/raped as an adolescent or adult?: No Was the patient ever a victim of a crime or a disaster?: No Witnessed domestic violence?: No Has patient been effected by domestic violence as an adult?: No  Education:  Highest grade of school patient has completed: Associate's degree Currently a student?: No Learning disability?: No  Employment/Work Situation:   Employment situation: Employed Where is patient currently employed?: [employer] How long has patient been employed?: 3 months Patient's job has been impacted by current illness: Yes Describe how patient's job has been impacted: mental illness What is the longest time patient has a held a job?: 12 years Where was the patient employed at that time?: [employer] Did You Receive Any Psychiatric Treatment/Services While in the [employer]?: No Are There Guns or Other Weapons in [place]?: Yes Types of Guns/Weapons: wife owns a gun in a safe box Are These Weapons Safely Secured?: Yes  Financial Resources:   Financial resources: Income from employment  Alcohol/Substance Abuse:   What has been your use of drugs/alcohol within the last 12 months?: none reported If attempted suicide, did drugs/alcohol play a role in this?: No Alcohol/Substance Abuse Treatment Hx: Denies past history Has alcohol/substance abuse ever caused legal problems?: No  Social Support System:   Patient's Community Support System: Good Describe Community Support System: wife, children, mom  Type of faith/religion: Darrick Meigs  How does patient's faith help to cope with current illness?: pray and go to church  Leisure/Recreation:   Leisure and Hobbies: sports, watching tv  Strengths/Needs:   What is the patient's  perception of their strengths?: "I am not sure" Patient states they  can use these personal strengths during their treatment to contribute to their recovery: "I have no idea" Patient states these barriers may affect/interfere with their treatment: "I dont have any barriers" Patient states these barriers may affect their return to the community: "I dont have any barriers"  Discharge Plan:   Currently receiving community mental health services: Yes (From Whom)(Dr [employer]) Patient states concerns and preferences for aftercare planning are: TBD with CSW- pt wants to go to wife's prim[facility] care doctor- he will provide information  Patient states they will know when they are safe and ready for discharge when: "I dont know" Does patient have access to transportation?: Yes(pt's wife will pick him up) Does patient have financial barriers related to discharge medications?: No Will patient be returning to same living situation after discharge?: Yes  Summary/Recommendations:   Summary and Recommendations (to be completed by the evaluator): Patient is a 47 year old male with no past psychiatric history. Principal Diagnosis: Major depressive disorder, recurrent, severe with psychotic features. The patient was brought to the ER twice in the past two days. Patient reports extremely poor sleep and difficulties at work, unable to carry on his responsibilities. The family reports disorganized, child-like behavior that they believe represents regression. They underscore that the patient has autism spectrum disorder. Patient will benefit from crisis stabilization, medication evaluation, group therapy and psychoeducation. In addition to case management for discharge planning. At discharge it is recommended that patient adhere to the established discharge plan and continue treatment.   Steven Knight  CUEBAS-COLON. 05/09/2018

## 2018-05-09 NOTE — Progress Notes (Signed)
[facility] MD Progress Note  05/09/2018 1:07 PM Steven Knight  MRN:  [id number]  Subjective:    Mr. Skill man is paranoid, delusional, hyper religious and easily agitated. He is very suspicious about his medications and counts all the pills. He hopes that God will guide him and take care of him. He is disorganized in his thinking unable to express himself which makes him mad. He is so disorganized that even eating a meal is a problem. He had a "Hulk moment" today when he lost composure. He orders me out of the room after a brief conversation.  Principal Problem: Major depressive disorder, recurrent, severe with psychotic features ([place]) Diagnosis:   Patient Active Problem List   Diagnosis Date Noted  . Major depressive disorder, recurrent, severe with psychotic features ([place]) [F33.3]     Priority: High   Total Time spent with patient: 20 minutes  Past Psychiatric History: none  Past Medical History:  Past Medical History:  Diagnosis Date  . Autism   . Autism    History reviewed. No pertinent surgical history. Family History: History reviewed. No pertinent family history. Family Psychiatric  History: none Social History:  Social History   Substance and Sexual Activity  Alcohol Use Never  . Frequency: Never     Social History   Substance and Sexual Activity  Drug Use Never    Social History   Socioeconomic History  . Marital status: Married    Spouse name: Not on file  . Number of children: Not on file  . Years of education: Not on file  . Highest education level: Not on file  Occupational History  . Not on file  Social Needs  . Financial resource strain: Not on file  . Food insecurity:    Worry: Not on file    Inability: Not on file  . Transportation needs:    Medical: Not on file    Non-medical: Not on file  Tobacco Use  . Smoking status: Never Smoker  . Smokeless tobacco: Never Used  Substance and Sexual Activity  . Alcohol use: Never    Frequency: Never   . Drug use: Never  . Sexual activity: Yes  Lifestyle  . Physical activity:    Days per week: Not on file    Minutes per session: Not on file  . Stress: Not on file  Relationships  . Social connections:    Talks on phone: Not on file    Gets together: Not on file    Attends religious service: Not on file    Active member of club or organization: Not on file    Attends meetings of clubs or organizations: Not on file    Relationship status: Not on file  Other Topics Concern  . Not on file  Social History Narrative  . Not on file   Additional Social History:                         Sleep: Poor  Appetite:  Fair  Current Medications: Current Facility-Administered Medications  Medication Dose Route Frequency Provider Last Rate Last Dose  . acetaminophen (TYLENOL) tablet 650 mg  650 mg Oral Q6H PRN Katriana Dortch B, MD      . alum & mag hydroxide-simeth (MAALOX/MYLANTA) 200-200-20 MG/5ML suspension 30 mL  30 mL Oral Q4H PRN Mandalyn Pasqua B, MD      . divalproex (DEPAKOTE) DR tablet 500 mg  500 mg Oral Q8H Vlada Uriostegui  B, MD   500 mg at 05/09/18 9233  . hydrOXYzine (ATARAX/VISTARIL) tablet 50 mg  50 mg Oral Q6H PRN Cayleen Benjamin B, MD      . magnesium hydroxide (MILK OF MAGNESIA) suspension 30 mL  30 mL Oral Daily PRN Marlin Jarrard B, MD      . nicotine (NICODERM CQ - dosed in mg/24 hours) patch 21 mg  21 mg Transdermal Q0600 Jayquon Theiler B, MD      . OLANZapine (ZYPREXA) tablet 15 mg  15 mg Oral BID AC & HS Ary Rudnick B, MD      . temazepam (RESTORIL) capsule 30 mg  30 mg Oral QHS Emmakate Hypes B, MD   30 mg at 05/08/18 2126    Lab Results:  Results for orders placed or performed during the hospital encounter of 05/08/18 (from the past 48 hour(s))  Lipid panel     Status: Abnormal   Collection Time: 05/09/18  8:57 AM  Result Value Ref Range   Cholesterol 145 0 - 200 mg/dL   Triglycerides 104 <150 mg/dL   HDL 35 (L)  >40 mg/dL   Total CHOL/HDL Ratio 4.1 RATIO   VLDL 21 0 - 40 mg/dL   LDL Cholesterol 89 0 - 99 mg/dL    Comment:        Total Cholesterol/HDL:CHD Risk Coronary Heart Disease Risk Table                     Men   Women  1/2 Average Risk   3.4   3.3  Average Risk       5.0   4.4  2 X Average Risk   9.6   7.1  3 X Average Risk  23.4   11.0        Use the calculated Patient Ratio above and the CHD Risk Table to determine the patient's CHD Risk.        ATP III CLASSIFICATION (LDL):  <100     mg/dL   Optimal  100-129  mg/dL   Near or Above                    Optimal  130-159  mg/dL   Borderline  160-189  mg/dL   High  >190     mg/dL   Very High Performed at [facility], [address] [place], [place] [address]   TSH     Status: None   Collection Time: 05/09/18  8:57 AM  Result Value Ref Range   TSH 1.557 0.350 - 4.500 uIU/mL    Comment: Performed by a 3rd Generation assay with a functional sensitivity of <=0.01 uIU/mL. Performed at [facility], [address] [place], [place] [address]     Blood Alcohol level:  Lab Results  Component Value Date   [facility] <10 05/07/2018   ETH <10 54/56/2563    Metabolic Disorder Labs: No results found for: HGBA1C, MPG No results found for: PROLACTIN Lab Results  Component Value Date   CHOL 145 05/09/2018   TRIG 104 05/09/2018   HDL 35 (L) 05/09/2018   CHOLHDL 4.1 05/09/2018   VLDL 21 05/09/2018   LDLCALC 89 05/09/2018    Physical Findings: AIMS: Facial and Oral Movements Muscles of Facial Expression: None, normal Lips and Perioral Area: None, normal Jaw: None, normal Tongue: None, normal,Extremity Movements Upper (arms, wrists, hands, fingers): None, normal Lower (legs, knees, ankles, toes): None, normal, Trunk Movements Neck, shoulders, hips:  None, normal, Overall Severity Severity of abnormal movements (highest score from questions above): None, normal Incapacitation due to abnormal movements: None,  normal Patient's awareness of abnormal movements (rate only patient's report): No Awareness, Dental Status Current problems with teeth and/or dentures?: No Does patient usually wear dentures?: No  CIWA:    COWS:     Musculoskeletal: Strength & Muscle Tone: within normal limits Gait & Station: normal Patient leans: N/A  Psychiatric Specialty Exam: Physical Exam  Nursing note and vitals reviewed. Psychiatric: His affect is angry and labile. His speech is rapid and/or pressured and tangential. He is agitated, hyperactive and actively hallucinating. Thought content is paranoid and delusional. Cognition and memory are impaired. He expresses impulsivity and inappropriate judgment.    Review of Systems  Neurological: Negative.   Psychiatric/Behavioral: Positive for hallucinations. The patient is nervous/anxious and has insomnia.   All other systems reviewed and are negative.   Blood pressure (!) 135/91, pulse 98, temperature (!) 97.4 F (36.3 C), temperature source Oral, resp. rate 18, height 6' 2"  (1.88 m), weight 76.2 kg (168 lb), SpO2 100 %.Body mass index is 21.57 kg/m.  General Appearance: Casual  Eye Contact:  Good  Speech:  Pressured  Volume:  Increased  Mood:  Angry, Dysphoric and Irritable  Affect:  Inappropriate  Thought Process:  Disorganized, Irrelevant and Descriptions of Associations: Loose  Orientation:  Full (Time, Place, and Person)  Thought Content:  Delusions, Hallucinations: Auditory and Paranoid Ideation  Suicidal Thoughts:  No  Homicidal Thoughts:  No  Memory:  Immediate;   Fair Recent;   Fair Remote;   Fair  Judgement:  Poor  Insight:  Lacking  Psychomotor Activity:  Increased  Concentration:  Concentration: Fair and Attention Span: Fair  Recall:  [employer] of Knowledge:  Fair  Language:  Fair  Akathisia:  No  Handed:  Right  AIMS (if indicated):     Assets:  Communication Skills Desire for Improvement Financial  Resources/Insurance Housing Physical Health Resilience Social Support  ADL's:  Intact  Cognition:  WNL  Sleep:  Number of Hours: 10     Treatment Plan Summary: Daily contact with patient to assess and evaluate symptoms and progress in treatment and Medication management   Mr. Tosh is a 47 year old male with no past psychiatric history admitted for psychosis, bizarre behaviors and suicidal ideation.  #Agitation -Haldol, Ativan, Benadryl PRN  #Mood/psychosis, floridly psychotic -increase Zyprexa to 15 mg BID -start depakote 500 mg TID  #Insomnia, slept better -Restoril 30 mg nightly  #Labs -lipid panel, TSH are normal, A1C -EKG -head Ct scan negative  #Disposition -discharge with family -follow up with new psychiatrist     Orson Slick, MD 05/09/2018, 1:07 PM

## 2018-05-09 NOTE — Plan of Care (Signed)
Patient is alert, and oriented to self and place, disoriented to situation and time. Patient is very manic paces the hallways and speech is rapid, tangential and needs to be reoriented to not run in the hallway when exercising. Patient is pleasant and redirectable. Night shift reported patient came out of room naked last night with just a blanket around him. Patient averts eyes but that could be due to autism diagnosis. Patient denies SI, HI and AVH. Nurse will continue to monitor. Problem: Education: Goal: Knowledge of [place] [facility] Education information/materials will improve Outcome: Progressing   Problem: Activity: Goal: Interest or engagement in activities will improve Outcome: Progressing   Problem: Coping: Goal: Ability to verbalize frustrations and anger appropriately will improve Outcome: Progressing Goal: Ability to demonstrate self-control will improve Outcome: Progressing   Problem: Safety: Goal: Periods of time without injury will increase Outcome: Progressing

## 2018-05-09 NOTE — Plan of Care (Addendum)
Patient introduced to Probation officer by day shift RN upon shift change due to patient having issues with transition. Patient is visible but not social this evening. Denies all complaints including SI/HI/AVH. Denies pain. Patient is minimally verbal, eye contact brief. Patient is impatient and demanding at times. Patient is very concrete and staff must give timeframe if unable to complete his request immediately. Patient responds well to this. Compliant with HS medications and staff direction. Staff watched patient shave head. Patient refused sheet for bed, "I'm more comfortable like this." Q 15 minute checks maintained. Will continue to monitor throughout the shift. Patient slept 7.75 hours. No apparent distress. Will endorse care to oncoming shift.  Problem: Activity: Goal: Interest or engagement in activities will improve Outcome: Progressing   Problem: Coping: Goal: Ability to verbalize frustrations and anger appropriately will improve Outcome: Progressing Goal: Ability to demonstrate self-control will improve Outcome: Progressing   Problem: Nutrition: Goal: Adequate nutrition will be maintained Outcome: Progressing   Problem: Coping: Goal: Level of anxiety will decrease Outcome: Progressing

## 2018-05-10 DIAGNOSIS — F312 Bipolar disorder, current episode manic severe with psychotic features: Principal | ICD-10-CM

## 2018-05-10 MED ORDER — DIVALPROEX SODIUM 500 MG PO DR TAB
500.0000 mg | DELAYED_RELEASE_TABLET | Freq: Every day | ORAL | Status: DC
  Administered 2018-05-11 – 2018-05-13 (×3): 500 mg via ORAL
  Filled 2018-05-10 (×3): qty 1

## 2018-05-10 MED ORDER — DIVALPROEX SODIUM 500 MG PO DR TAB
1000.0000 mg | DELAYED_RELEASE_TABLET | Freq: Every day | ORAL | Status: DC
  Administered 2018-05-10 – 2018-05-12 (×3): 1000 mg via ORAL
  Filled 2018-05-10 (×3): qty 2

## 2018-05-10 MED ORDER — OLANZAPINE 5 MG PO TABS
15.0000 mg | ORAL_TABLET | Freq: Every day | ORAL | Status: DC
  Administered 2018-05-10 – 2018-05-11 (×2): 15 mg via ORAL
  Filled 2018-05-10 (×2): qty 1

## 2018-05-10 MED ORDER — CLONAZEPAM 0.5 MG PO TABS
0.5000 mg | ORAL_TABLET | Freq: Every day | ORAL | Status: DC
  Administered 2018-05-10 – 2018-05-11 (×2): 0.5 mg via ORAL
  Filled 2018-05-10 (×2): qty 1

## 2018-05-10 MED ORDER — OLANZAPINE 10 MG PO TABS
10.0000 mg | ORAL_TABLET | ORAL | Status: DC
  Administered 2018-05-11: 10 mg via ORAL
  Filled 2018-05-10 (×2): qty 1

## 2018-05-10 MED ORDER — OLANZAPINE 10 MG PO TABS: 10.0000 mg | ORAL_TABLET | Freq: Two times a day (BID) | ORAL | Status: DC

## 2018-05-10 NOTE — Tx Team (Addendum)
Interdisciplinary Treatment and Diagnostic Plan Update  05/10/2018 Time of Session: 11am Steven Knight MRN: [id number]  Principal Diagnosis: Bipolar I disorder, current or most recent episode manic, with psychotic features ([place])  Secondary Diagnoses: Principal Problem:   Bipolar I disorder, current or most recent episode manic, with psychotic features ([place])   Current Medications:  Current Facility-Administered Medications  Medication Dose Route Frequency Provider Last Rate Last Dose  . acetaminophen (TYLENOL) tablet 650 mg  650 mg Oral Q6H PRN Pucilowska, Jolanta B, MD      . alum & mag hydroxide-simeth (MAALOX/MYLANTA) 200-200-20 MG/5ML suspension 30 mL  30 mL Oral Q4H PRN Pucilowska, Jolanta B, MD      . clonazePAM (KLONOPIN) tablet 0.5 mg  0.5 mg Oral QHS McNew, Holly R, MD      . diphenhydrAMINE (BENADRYL) capsule 50 mg  50 mg Oral Q6H PRN Pucilowska, Jolanta B, MD   50 mg at 05/09/18 1453   Or  . diphenhydrAMINE (BENADRYL) injection 50 mg  50 mg Intramuscular Q6H PRN Pucilowska, Jolanta B, MD      . Derrill Memo ON 05/11/2018] divalproex (DEPAKOTE) DR tablet 500 mg  500 mg Oral Q breakfast McNew, Tyson Babinski, MD       And  . divalproex (DEPAKOTE) DR tablet 1,000 mg  1,000 mg Oral QHS McNew, Holly R, MD      . haloperidol (HALDOL) tablet 10 mg  10 mg Oral Q6H PRN Pucilowska, Jolanta B, MD   10 mg at 05/09/18 1453   Or  . haloperidol lactate (HALDOL) injection 10 mg  10 mg Intramuscular Q6H PRN Pucilowska, Jolanta B, MD      . hydrOXYzine (ATARAX/VISTARIL) tablet 50 mg  50 mg Oral Q6H PRN Pucilowska, Jolanta B, MD      . LORazepam (ATIVAN) tablet 2 mg  2 mg Oral Q6H PRN Pucilowska, Jolanta B, MD   2 mg at 05/09/18 1453   Or  . LORazepam (ATIVAN) injection 2 mg  2 mg Intramuscular Q6H PRN Pucilowska, Jolanta B, MD      . magnesium hydroxide (MILK OF MAGNESIA) suspension 30 mL  30 mL Oral Daily PRN Pucilowska, Jolanta B, MD      . OLANZapine (ZYPREXA) tablet 10 mg  10 mg Oral BH-q7a  McNew, Tyson Babinski, MD       And  . OLANZapine (ZYPREXA) tablet 15 mg  15 mg Oral QHS McNew, Tyson Babinski, MD       PTA Medications: Medications Prior to Admission  Medication Sig Dispense Refill Last Dose  . clonazePAM (KLONOPIN) 0.5 MG tablet Take 1 tablet (0.5 mg total) by mouth 2 (two) times daily as needed for anxiety. 14 tablet 0 unknown at unknown  . PARoxetine (PAXIL) 20 MG tablet Take 1 tablet (20 mg total) by mouth daily for 14 days. 14 tablet 0 unknown at unknown  . traZODone (DESYREL) 50 MG tablet Take 1 tablet (50 mg total) by mouth at bedtime as needed for sleep. 14 tablet 0 unknown at unknown    Patient Stressors: Traumatic event  Patient Strengths: Ability for insight Average or above average intelligence Capable of independent living Communication skills Supportive family/friends  Treatment Modalities: Medication Management, Group therapy, Case management,  1 to 1 session with clinician, Psychoeducation, Recreational therapy.   Physician Treatment Plan for Primary Diagnosis: Bipolar I disorder, current or most recent episode manic, with psychotic features ([place]) Long Term Goal(s): Improvement in symptoms so as ready for discharge NA   Short Term Goals:  Ability to identify changes in lifestyle to reduce recurrence of condition will improve Ability to verbalize feelings will improve Ability to disclose and discuss suicidal ideas Ability to demonstrate self-control will improve Ability to identify and develop effective coping behaviors will improve Ability to maintain clinical measurements within normal limits will improve Compliance with prescribed medications will improve Ability to identify triggers associated with substance abuse/mental health issues will improve NA  Medication Management: Evaluate patient's response, side effects, and tolerance of medication regimen.  Therapeutic Interventions: 1 to 1 sessions, Unit Group sessions and Medication  administration.  Evaluation of Outcomes: Progressing  Physician Treatment Plan for Secondary Diagnosis: Principal Problem:   Bipolar I disorder, current or most recent episode manic, with psychotic features ([place])  Long Term Goal(s): Improvement in symptoms so as ready for discharge NA   Short Term Goals: Ability to identify changes in lifestyle to reduce recurrence of condition will improve Ability to verbalize feelings will improve Ability to disclose and discuss suicidal ideas Ability to demonstrate self-control will improve Ability to identify and develop effective coping behaviors will improve Ability to maintain clinical measurements within normal limits will improve Compliance with prescribed medications will improve Ability to identify triggers associated with substance abuse/mental health issues will improve NA     Medication Management: Evaluate patient's response, side effects, and tolerance of medication regimen.  Therapeutic Interventions: 1 to 1 sessions, Unit Group sessions and Medication administration.  Evaluation of Outcomes: Progressing   RN Treatment Plan for Primary Diagnosis: Bipolar I disorder, current or most recent episode manic, with psychotic features ([place]) Long Term Goal(s): Knowledge of disease and therapeutic regimen to maintain health will improve  Short Term Goals: Ability to verbalize feelings will improve, Ability to identify and develop effective coping behaviors will improve and Compliance with prescribed medications will improve  Medication Management: RN will administer medications as ordered by provider, will assess and evaluate patient's response and provide education to patient for prescribed medication. RN will report any adverse and/or side effects to prescribing provider.  Therapeutic Interventions: 1 on 1 counseling sessions, Psychoeducation, Medication administration, Evaluate responses to treatment, Monitor vital signs and CBGs as  ordered, Perform/monitor CIWA, COWS, AIMS and Fall Risk screenings as ordered, Perform wound care treatments as ordered.  Evaluation of Outcomes: Progressing   LCSW Treatment Plan for Primary Diagnosis: Bipolar I disorder, current or most recent episode manic, with psychotic features ([place]) Long Term Goal(s): Safe transition to appropriate next level of care at discharge, Engage patient in therapeutic group addressing interpersonal concerns.  Short Term Goals: Engage patient in aftercare planning with referrals and resources, Increase social support, Identify triggers associated with mental health/substance abuse issues and Increase skills for wellness and recovery  Therapeutic Interventions: Assess for all discharge needs, 1 to 1 time with Social worker, Explore available resources and support systems, Assess for adequacy in community support network, Educate family and significant other(s) on suicide prevention, Complete Psychosocial Assessment, Interpersonal group therapy.  Evaluation of Outcomes: Progressing   Progress in Treatment: Attending groups: Yes. Participating in groups: Yes. Taking medication as prescribed: Yes. Toleration medication: Yes. Family/Significant other contact made: Yes, individual(s) contacted:  Wife Patient understands diagnosis: Yes. Discussing patient identified problems/goals with staff: Yes. Medical problems stabilized or resolved: Yes. Denies suicidal/homicidal ideation: Yes. Issues/concerns per patient self-inventory: No. Other:   New problem(s) identified: No, Describe:  None  New Short Term/Long Term Goal(s):  Patient Goals:  N/A  Discharge Plan or Barriers: To return home and follow up with  an outpatient provider.  Reason for Continuation of Hospitalization: Mania Medication stabilization  Estimated Length of Stay: 3-5 days  Recreational Therapy: Patient Stressors: N/A    Patient Goal: Patient will identify 3 positive coping skills  strategies to use post d/c within 5 recreation therapy group sessions  Attendees: Patient: 05/10/2018 11:35 AM  Physician: Dr. Wonda Olds, MD 05/10/2018 11:35 AM  Nursing: West Pugh RN 05/10/2018 11:35 AM  RN Care Manager: 05/10/2018 11:35 AM  Social Worker: Darin Engels, [place] 05/10/2018 11:35 AM  Recreational Therapist: Isaias Sakai. Captain Blucher CTRS, LRT 05/10/2018 11:35 AM  Other: Dossie Arbour, LCSW 05/10/2018 11:35 AM  Other:  05/10/2018 11:35 AM  Other: 05/10/2018 11:35 AM    Scribe for Treatment Team: Darin Engels, LCSW 05/10/2018 11:35 AM

## 2018-05-10 NOTE — Progress Notes (Signed)
[facility] MD Progress Note  05/10/2018 10:11 AM Steven FLEMMER  MRN:  [id number] Subjective:  History was reviewed with patient. He states taht he is in the hospital because "My family can't sleep at night. They are not used to the behavioral stuff." He states taht he started a new job 3 months ago and found out that he was doing it wrong. HE works as a Geophysicist/field seismologist for [employer]. Pt appears calmer today but is still disorganized and hyperverbal. He is very tangential. He states that he slept "like a rock" last night which is an improvement. He admits he was not sleeping much before the hospital. He becomes very tearful talking about this. He states that he was "just walking the dogs. My family didn't understand." He then talks about how it will be the last Christmas before his children are married. He discusses that he wants to start a small group for "empty nestors. I can also play basketball with them." He denies SI stating "The bible says taht is not forgivable so I would not do that." He is drooling during interview.  I spoke with his daughter, Colon Branch and his wife, [name]. They both gave. [name] states that they have been married for 10 years. He has "very high functioning autism." He has been on Paxil and Klonopin for several years. He started new job in February as a Geophysicist/field seismologist for [employer]. She states taht this has been stressful for him because it is a new job. 2 weeks ago the family noticed that he stopped taking Klonopin because there was an issue with insurance. She believes he was still taking Paxil consistently. He did not follow up with his PCP (Dr. Lavera Guise). They noticed about 2 weeks ago some strange behaviors. They were having a graduation party for their daughter. HE brought over random young kids from the neighborhood and was saying that they were his new friends. This was very out of the ordinary for him. He got agitated when the family was telling him taht was not appropriate. Then at work he started  missing stops and not doing well. Per his daughter, he is very organized and never misses any th ing at work usually. His boss told the family he was being erratic, he broke the door on the truck which then fell on him. He was cussing out a Art therapist which is completely out of character for him. He started not sleeping at all. He was talking to a family member that is deceased. He was getting "visions from God." His family went to pick him up from work and he was found running down the highway. They took him to [facility] but they "Chalked it up to autism and discharge him." He has been acting like he "is 47 years old." Per his daughter, after he was discharged from the ED, pt asked his son in law if he was armed and asking about his guns. His daughter states that one night he woke up screaming incoherently. The family woke up and he was talking completely incoherent and yelling "help me." HE was in the bathroom making a lot of noise. When they went in, he was very confused, trying to take a shower but was talking to the wall incoherently. He was not sleeping at all for several days. [name] states that she is very worried because she will be at a conference until next Tuesday and does not feel safe with him being home alone.   Principal Problem: Bipolar I  disorder, current or most recent episode manic, with psychotic features ([place]) Diagnosis:   Patient Active Problem List   Diagnosis Date Noted  . Bipolar I disorder, current or most recent episode manic, with psychotic features ([place]) [F31.2]    Total Time spent with patient: 30 minutes  Past Psychiatric History: See H&P  Past Medical History:  Past Medical History:  Diagnosis Date  . Autism   . Autism    History reviewed. No pertinent surgical history. Family History: History reviewed. No pertinent family history. Family Psychiatric  History: See h&P Social History:  Social History   Substance and Sexual Activity  Alcohol Use Never  .  Frequency: Never     Social History   Substance and Sexual Activity  Drug Use Never    Social History   Socioeconomic History  . Marital status: Married    Spouse name: Not on file  . Number of children: Not on file  . Years of education: Not on file  . Highest education level: Not on file  Occupational History  . Not on file  Social Needs  . Financial resource strain: Not on file  . Food insecurity:    Worry: Not on file    Inability: Not on file  . Transportation needs:    Medical: Not on file    Non-medical: Not on file  Tobacco Use  . Smoking status: Never Smoker  . Smokeless tobacco: Never Used  Substance and Sexual Activity  . Alcohol use: Never    Frequency: Never  . Drug use: Never  . Sexual activity: Yes  Lifestyle  . Physical activity:    Days per week: Not on file    Minutes per session: Not on file  . Stress: Not on file  Relationships  . Social connections:    Talks on phone: Not on file    Gets together: Not on file    Attends religious service: Not on file    Active member of club or organization: Not on file    Attends meetings of clubs or organizations: Not on file    Relationship status: Not on file  Other Topics Concern  . Not on file  Social History Narrative  . Not on file   Additional Social History:                         Sleep: Fair  Appetite:  Fair  Current Medications: Current Facility-Administered Medications  Medication Dose Route Frequency Provider Last Rate Last Dose  . acetaminophen (TYLENOL) tablet 650 mg  650 mg Oral Q6H PRN Pucilowska, Jolanta B, MD      . alum & mag hydroxide-simeth (MAALOX/MYLANTA) 200-200-20 MG/5ML suspension 30 mL  30 mL Oral Q4H PRN Pucilowska, Jolanta B, MD      . clonazePAM (KLONOPIN) tablet 0.5 mg  0.5 mg Oral QHS Dontrelle Mazon R, MD      . diphenhydrAMINE (BENADRYL) capsule 50 mg  50 mg Oral Q6H PRN Pucilowska, Jolanta B, MD   50 mg at 05/09/18 1453   Or  . diphenhydrAMINE  (BENADRYL) injection 50 mg  50 mg Intramuscular Q6H PRN Pucilowska, Jolanta B, MD      . Derrill Memo ON 05/11/2018] divalproex (DEPAKOTE) DR tablet 500 mg  500 mg Oral Q breakfast Moya Duan R, MD       And  . divalproex (DEPAKOTE) DR tablet 1,000 mg  1,000 mg Oral QHS Adanna Zuckerman, Tyson Babinski, MD      .  haloperidol (HALDOL) tablet 10 mg  10 mg Oral Q6H PRN Pucilowska, Jolanta B, MD   10 mg at 05/09/18 1453   Or  . haloperidol lactate (HALDOL) injection 10 mg  10 mg Intramuscular Q6H PRN Pucilowska, Jolanta B, MD      . hydrOXYzine (ATARAX/VISTARIL) tablet 50 mg  50 mg Oral Q6H PRN Pucilowska, Jolanta B, MD      . LORazepam (ATIVAN) tablet 2 mg  2 mg Oral Q6H PRN Pucilowska, Jolanta B, MD   2 mg at 05/09/18 1453   Or  . LORazepam (ATIVAN) injection 2 mg  2 mg Intramuscular Q6H PRN Pucilowska, Jolanta B, MD      . magnesium hydroxide (MILK OF MAGNESIA) suspension 30 mL  30 mL Oral Daily PRN Pucilowska, Jolanta B, MD      . OLANZapine (ZYPREXA) tablet 10 mg  10 mg Oral BH-q7a Alexsia Klindt, Tyson Babinski, MD       And  . OLANZapine (ZYPREXA) tablet 15 mg  15 mg Oral QHS Clorissa Gruenberg, Tyson Babinski, MD        Lab Results:  Results for orders placed or performed during the hospital encounter of 05/08/18 (from the past 48 hour(s))  Hemoglobin A1c     Status: None   Collection Time: 05/09/18  8:57 AM  Result Value Ref Range   Hgb A1c MFr Bld 5.5 4.8 - 5.6 %    Comment: (NOTE) Pre diabetes:          5.7%-6.4% Diabetes:              >6.4% Glycemic control for   <7.0% adults with diabetes    Mean Plasma Glucose 111.15 mg/dL    Comment: Performed at [facility], [place] [address] [place], [place] [address]  Lipid panel     Status: Abnormal   Collection Time: 05/09/18  8:57 AM  Result Value Ref Range   Cholesterol 145 0 - 200 mg/dL   Triglycerides 104 <150 mg/dL   HDL 35 (L) >40 mg/dL   Total CHOL/HDL Ratio 4.1 RATIO   VLDL 21 0 - 40 mg/dL   LDL Cholesterol 89 0 - 99 mg/dL    Comment:        Total Cholesterol/HDL:CHD  Risk Coronary Heart Disease Risk Table                     Men   Women  1/2 Average Risk   3.4   3.3  Average Risk       5.0   4.4  2 X Average Risk   9.6   7.1  3 X Average Risk  23.4   11.0        Use the calculated Patient Ratio above and the CHD Risk Table to determine the patient's CHD Risk.        ATP III CLASSIFICATION (LDL):  <100     mg/dL   Optimal  100-129  mg/dL   Near or Above                    Optimal  130-159  mg/dL   Borderline  160-189  mg/dL   High  >190     mg/dL   Very High Performed at [facility], [address] [place], [place] [address]   TSH     Status: None   Collection Time: 05/09/18  8:57 AM  Result Value Ref Range   TSH 1.557 0.350 - 4.500  uIU/mL    Comment: Performed by a 3rd Generation assay with a functional sensitivity of <=0.01 uIU/mL. Performed at [facility] [place], [address] [place], [place] [address]     Blood Alcohol level:  Lab Results  Component Value Date   [facility] <10 05/07/2018   ETH <10 67/12/4101    Metabolic Disorder Labs: Lab Results  Component Value Date   HGBA1C 5.5 05/09/2018   MPG 111.15 05/09/2018   No results found for: PROLACTIN Lab Results  Component Value Date   CHOL 145 05/09/2018   TRIG 104 05/09/2018   HDL 35 (L) 05/09/2018   CHOLHDL 4.1 05/09/2018   VLDL 21 05/09/2018   LDLCALC 89 05/09/2018    Physical Findings: AIMS: Facial and Oral Movements Muscles of Facial Expression: None, normal Lips and Perioral Area: None, normal Jaw: None, normal Tongue: None, normal,Extremity Movements Upper (arms, wrists, hands, fingers): None, normal Lower (legs, knees, ankles, toes): None, normal, Trunk Movements Neck, shoulders, hips: None, normal, Overall Severity Severity of abnormal movements (highest score from questions above): None, normal Incapacitation due to abnormal movements: None, normal Patient's awareness of abnormal movements (rate only patient's report): No Awareness, Dental  Status Current problems with teeth and/or dentures?: No Does patient usually wear dentures?: No  CIWA:    COWS:     Musculoskeletal: Strength & Muscle Tone: within normal limits Gait & Station: normal Patient leans: N/A  Psychiatric Specialty Exam: Physical Exam  ROS  Blood pressure 122/86, pulse (!) 110, temperature (!) 97.5 F (36.4 C), temperature source Oral, resp. rate 18, height 6' 2"  (1.88 m), weight 76.2 kg (168 lb), SpO2 100 %.Body mass index is 21.57 kg/m.  General Appearance: Casual  Eye Contact:  Fair  Speech:  Pressured  Volume:  Normal  Mood:  Irritable  Affect:  Labile  Thought Process:  Disorganized  Orientation:  Full (Time, Place, and Person)  Thought Content:  Delusions  Suicidal Thoughts:  No  Homicidal Thoughts:  No  Memory:  Immediate;   Fair  Judgement:  Impaired  Insight:  Lacking  Psychomotor Activity:  Normal  Concentration:  Concentration: Poor  Recall:  [employer] of Knowledge:  Fair  Language:  Fair  Akathisia:  No  =    Assets:  Resilience  ADL's:  Intact  Cognition:  WNL  Sleep:  Number of Hours: 7.75     Treatment Plan Summary: 47 yo male admitted due to manic behaviors. In speaking with his family, it appears that he has had first episode mania. He has never had any symptoms similar in the past. He is still tangential, labile, disorganized but seems calmer compared to over the weekend. He has not had any agitation today. He did sleep last night. HE is drooling through the interview so will lower Zyprexa slightly.   Plan:  Bipolar disorder -Decrease Zyprexa to 10 mg qam and 15 mg qpm -Change Depakote to 500 mg qam and 1000 mg qhs. Will check level on 6/6 -Stop Temazepam and start Klonopin 0.5 mg qhs. His family states he did well on this -Head CT negative -EKG not done yet  Dispo -He will return home with family. HE will need follow up with psychiatrist Marylin Cros[place], MD 05/10/2018, 10:11 AM

## 2018-05-10 NOTE — BHH Group Notes (Signed)
[facility] Group Notes:  (Nursing/MHT/Case Management/Adjunct)  Date:  05/10/2018  Time:  8:54 PM  Type of Therapy:  Group Therapy  Participation Level:  Active  Participation Quality:  Sharing  Affect:  Appropriate  Cognitive:  Alert  Insight:  Good  Engagement in Group:  Engaged  Modes of Intervention:  Discussion, Exploration and Socialization  Summary of Progress/Problems:  Steven Knight 05/10/2018, 8:54 PM

## 2018-05-10 NOTE — Progress Notes (Signed)
Recreation Therapy Notes  Date: 05/10/2018  Time: 9:30 am  Location: Craft Room  Behavioral response: Appropriate   Intervention Topic: Happiness  Discussion/Intervention:  Group content today was focused on Happiness. The group defined happiness and stated reasons they are and are not happy at times. Participants identified reasons they are normally happy and why. Individuals expressed how not being happy affects themselves and others. Patients stated reasons why happiness is important to them. The group described how they feel when they are happy. Individuals participated in the intervention "What is happiness" where they defined what happiness means to them.  Clinical Observations/Feedback:  Patient came to group late due to unknown reasons. He participated in the intervention and was social with peers and staff during group.  Hughie Melroy LRT/CTRS         Brenna Friesenhahn 05/10/2018 11:48 AM

## 2018-05-10 NOTE — Plan of Care (Signed)
Patient continues to be disorganized at times.States "put the correct date on my arm band.This says 6/1.Then I can tell the doctor when she ask me.Otherwise she will think I am confused."Told patient that 6/1 is his admission date.Compliant with medications.Patient played basket ball outside courtyard.States "I need to get ready they are coming at 6."No aggressive behaviors noted.

## 2018-05-10 NOTE — Progress Notes (Signed)
Recreation Therapy Notes  INPATIENT RECREATION THERAPY ASSESSMENT  Patient Details Name: GERALDO HARIS MRN: [id number] DOB: 02-22-1971 Today's Date: 05/10/2018  Patient would not wake up for assessment.       Information Obtained From:    Able to Participate in Assessment/Interview:    Patient Presentation:    Reason for Admission (Per Patient):    Patient Stressors:    Coping Skills:      Leisure Interests (2+):     Frequency of Recreation/Participation:    Awareness of Community Resources:     [employer]:     Current Use:    If no, Barriers?:    Expressed Interest in [place] of Residence:     Patient Main Form of Transportation:    Patient Strengths:     Patient Identified Areas of Improvement:     Patient Goal for Hospitalization:     Current SI (including self-harm):     Current HI:     Current AVH:    Staff Intervention Plan:    Consent to Intern Participation:    Jerrett Baldinger 05/10/2018, 2:31 PM

## 2018-05-10 NOTE — Plan of Care (Addendum)
Patient found in room upon my arrival. Patient is visible and social this evening. Played game with visitors. Patient is acting much more appropriately this evening. Patient asks questions regarding when things will happen but accepts answers more easily. Affect is more animated, mood is improved. Speech is logical and coherent, without rapidity and intrusiveness. Patient is less hyperactive but remains ritualistic about arranging items in his room. Patient is meticulously clean. Patient is polite and cooperative. Reports eating and voiding adequately. Denies SI/HI/AVH, depression, and anxiety. Compliant with HS medications and staff direction. Q 15 minute checks maintained. Will continue to monitor throughout the shift. Patient awake complaining of severe left foot pain. Patient states, "I can't walk. I need a wheelchair." Upon inspection, patient has a very small crack at the back of his heel (left). Cleansed area and applied triple antibiotic ointment and a bandaide. Patient is upset, anxious, and overeative. Given Haldol 43m, Bendryl 549m and Ativan 16m70mO for agitation and Tylenol 650m54mr pain. Patient was not provided a wheelchair as the crack cannot possibly debilitating. Will monitor for efficacy. Patient slept restlessly 5 hours. Will endorse care to oncoming shift.   Problem: Education: Goal: Knowledge of [facility] Education information/materials will improve Outcome: Progressing   Problem: Activity: Goal: Interest or engagement in activities will improve Outcome: Progressing   Problem: Coping: Goal: Ability to verbalize frustrations and anger appropriately will improve Outcome: Progressing Goal: Ability to demonstrate self-control will improve Outcome: Progressing   Problem: Nutrition: Goal: Adequate nutrition will be maintained Outcome: Progressing   Problem: Coping: Goal: Level of anxiety will decrease Outcome: Progressing

## 2018-05-11 NOTE — BHH Counselor (Signed)
CSW and patient completed the Authorization to [place] form. The form was faxed to the patients insurance company at [employer] to be able to find providers in the patients network for hospital discharge follow up.  Darin Engels, MSW, Latanya Presser, [place] Clinical Social Worker 05/11/2018 10:48 AM

## 2018-05-11 NOTE — Progress Notes (Signed)
Recreation Therapy Notes  Date: 05/11/2018  Time: 9:30 am  Location: Craft Room  Behavioral response: Appropriate, Attention seeking  Intervention Topic: Goals  Discussion/Intervention:  Group content on today was focused on goals. Patients described what goals are and how they define goals. Individuals expressed how they go about setting goals and reaching them. The group identified how important goals are and if they make short term goals to reach long term goals. Patients described how many goals they work on at a time and what affects them not reaching their goal. Individuals described how much time they put into planning and obtaining their goals. The group participated in the intervention "My Goal Board" and made personal goal boards to help them achieve their goal. Clinical Observations/Feedback:  Patient came to group late due to unknown reasons. Once patient arrived in group he sat on the floor of the craft room. Group facilitator continued with group and patient eventually got up and sat in a chair and engaged in the intervention and was social with peers and staff during group. Participant left group early due to unknown reasons and never returned. Steven Knight LRT/CTRS         Steven Knight 05/11/2018 11:05 AM

## 2018-05-11 NOTE — BHH Group Notes (Signed)
05/11/2018 1PM  Type of Therapy/Topic:  Group Therapy:  Feelings about Diagnosis  Participation Level:  Active   Description of Group:   This group will allow patients to explore their thoughts and feelings about diagnoses they have received. Patients will be guided to explore their level of understanding and acceptance of these diagnoses. Facilitator will encourage patients to process their thoughts and feelings about the reactions of others to their diagnosis and will guide patients in identifying ways to discuss their diagnosis with significant others in their lives. This group will be process-oriented, with patients participating in exploration of their own experiences, giving and receiving support, and processing challenge from other group members.   Therapeutic Goals: 1. Patient will demonstrate understanding of diagnosis as evidenced by identifying two or more symptoms of the disorder 2. Patient will be able to express two feelings regarding the diagnosis 3. Patient will demonstrate their ability to communicate their needs through discussion and/or role play  Summary of Patient Progress: Actively engaged in the group. Patient got into a small verbal altercation with another patient in the group. Patient reports a symptom of having anger outburst. He reports that he is working on controlling his tempter by taking his medications, meditation and deep breathing. Patient was up and down several times during the group and needed to be redirected several times to get back on topic. He did well in the communication activity. Patient is still in the process of obtaining treatment goals.        Therapeutic Modalities:   Cognitive Behavioral Therapy Brief Therapy Feelings Identification    Darin Engels, [place] 05/11/2018 2:55 PM

## 2018-05-11 NOTE — Plan of Care (Signed)
Patient is calm and cooperative most of the time.Patient threw the basket with linen when he was told that he can not do laundry at group time.Patient took it back to his room.Denies SI,HI and AVH.Patient states "I think my medicines working now.I am not confused anymore."Attended groups.Compliant with medications.

## 2018-05-11 NOTE — BHH Counselor (Signed)
CSW called the patients wife Alexandre Faries to assist with finding the patient a psychiatrist within his insurance. CSW called the insurance company yesturday [facility]) to inquire about a provider in the network that the patient can follow up with. The insurance company would not provide any information to the CSW.Patients wife reports that she will attempt to call the insurance company to see if they will provide her with any information on providers for the patient. CSW will check back in with the wife at a later time.  Darin Engels, MSW, Latanya Presser, [place] Clinical Social Worker 05/11/2018 9:17 AM

## 2018-05-11 NOTE — BHH Group Notes (Signed)
[facility] Group Notes:  (Nursing/MHT/Case Management/Adjunct)  Date:  05/11/2018  Time:  3:01 PM  Type of Therapy:  Psychoeducational Skills  Participation Level:  Did Not Attend    Drake Leach 05/11/2018, 3:01 PM
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
## 2018-05-12 MED ORDER — ARIPIPRAZOLE 10 MG PO TABS
10.0000 mg | ORAL_TABLET | Freq: Once | ORAL | Status: AC
  Administered 2018-05-12: 10 mg via ORAL
  Filled 2018-05-12: qty 1

## 2018-05-12 MED ORDER — ARIPIPRAZOLE 5 MG PO TABS
15.0000 mg | ORAL_TABLET | Freq: Every day | ORAL | Status: DC
  Administered 2018-05-13 – 2018-05-14 (×2): 15 mg via ORAL
  Filled 2018-05-12 (×2): qty 1

## 2018-05-12 MED ORDER — TEMAZEPAM 15 MG PO CAPS
15.0000 mg | ORAL_CAPSULE | Freq: Every day | ORAL | Status: DC
  Administered 2018-05-12 – 2018-05-13 (×2): 15 mg via ORAL
  Filled 2018-05-12 (×2): qty 1

## 2018-05-12 NOTE — Progress Notes (Signed)
Writer was introduced to patient by the outgoing shift , he appears less anxious, affect is blunted mood is pleasant, he was receiptive towards staff. Patient is visible in the milieu but noted not interacting with peers and guarded with staff.  Patient was given support and encouraged to attend evening wrap up group. Patient attends evening wrap but does not interact with peers and staff appropriately. 15 minutes safety checks maintained will continue to monitor.

## 2018-05-12 NOTE — Plan of Care (Signed)
  Problem: Coping: Goal: Ability to verbalize frustrations and anger appropriately will improve Outcome: Progressing  Patient verbalized frustration to staff and was encouraged to use cooping skills, and encouraged to attend group and socialize with peers and staff appropriately.

## 2018-05-12 NOTE — Progress Notes (Signed)
D- Patient alert and oriented. Patient presents in a pleasant mood on assessment stating that he slept "like a rock" last night, however, he has complaints of anxiety stating that his anxiety level is a "5/10" because "I wanted to wash my clothes this morning". Patient denies SI, HI, AVH, and pain at this time. Patient also denies any signs/symptoms of depression. Patient's goal for today is to "listen more".  A- Scheduled medications administered to patient, per MD orders. Support and encouragement provided.  Routine safety checks conducted every 15 minutes.  Patient informed to notify staff with problems or concerns.  R- No adverse drug reactions noted. Patient contracts for safety at this time. Patient compliant with medications and treatment plan. Patient receptive, calm, and cooperative. Patient interacts well with others on the unit.  Patient remains safe at this time.

## 2018-05-12 NOTE — Progress Notes (Signed)
Recreation Therapy Notes   Date: 05/12/2018  Time: 9:30 am  Location: Craft Room  Behavioral response: Appropriate   Intervention Topic: Anger Management  Discussion/Intervention:  Group content on today was focused on anger management. The group defined anger and reasons they become angry. Individuals expressed negative way they have dealt with anger in the past. Patients stated some positive ways they could deal with anger in the future. The group described how anger can affect your health and daily plans. Individuals participated in the intervention "Score your anger" where they had a chance to answer questions about themselves and get a score of their anger.  Clinical Observations/Feedback:  Patient came to group late due to unknown reasons. Individual participated in the intervention and was social with peers and staff during group.  Steven Knight LRT/CTRS         Shann Merrick 05/12/2018 11:55 AM

## 2018-05-12 NOTE — Progress Notes (Signed)
Patient awake noted pacing down the hallway with eyes closed saying " l can't walk and l can't see I need wheelchair" Patient was assisted back to room, but he got up again started pacing allover the unit, saying " I can't see" patient was frequently redirected, upon inspection patient is able to read words on the wall, respiration even non labored, ROM x 4, bandaide intact to both heels, assisted into a wheel chair and assisted in bed. 15 minutes safety checks maintained will continue to monitor.

## 2018-05-12 NOTE — Progress Notes (Addendum)
[facility] MD Progress Note  05/12/2018 11:03 AM Steven Knight  MRN:  [id number] Subjective:  Pt still having outbursts at times when he does not get what he wants. He has been complaining of blurry vision and not being able to see and walk. However, per RN staff, he is able to read well. This morning, he did have brief outbursts when he yelled out because he was frustrated. He only slept 4 hours last night. On assessment today, he appears much more organized and calm during interaction. He has improved insight and states that he is trying to work on these outbursts. He states that he went to his room to get away from the situation and put ice on his head to calm down which was helpful. HE is still slightly bizzare and odd but much more organized. He is taking medications appropriately. He denies SI and HI. Discussed with him about family concerns about asking for a gun. He laughed and states, "I'm not going to hurt myself or other people. I just wanted to make sure my son in law could protect the house in case someone broke in because I'm in a weak state." He states, "That was all misinterpreted." Pt still confused at times. I spoke with his son, Theresia Lo. He states that the visit last night went well however still emotional and "childish". His son brought in paperwork for short term disability and FMLA. Pt agreed for me to have this filled out and faxed over. He signed consents for this.   Principal Problem: Bipolar I disorder, current or most recent episode manic, with psychotic features ([place]) Diagnosis:   Patient Active Problem List   Diagnosis Date Noted  . Bipolar I disorder, current or most recent episode manic, with psychotic features ([place]) [F31.2]    Total Time spent with patient:45 minutes  Past Psychiatric History: See h&P  Past Medical History:  Past Medical History:  Diagnosis Date  . Autism   . Autism    History reviewed. No pertinent surgical history. Family History: History  reviewed. No pertinent family history. Family Psychiatric  History: See h&P Social History:  Social History   Substance and Sexual Activity  Alcohol Use Never  . Frequency: Never     Social History   Substance and Sexual Activity  Drug Use Never    Social History   Socioeconomic History  . Marital status: Married    Spouse name: Not on file  . Number of children: Not on file  . Years of education: Not on file  . Highest education level: Not on file  Occupational History  . Not on file  Social Needs  . Financial resource strain: Not on file  . Food insecurity:    Worry: Not on file    Inability: Not on file  . Transportation needs:    Medical: Not on file    Non-medical: Not on file  Tobacco Use  . Smoking status: Never Smoker  . Smokeless tobacco: Never Used  Substance and Sexual Activity  . Alcohol use: Never    Frequency: Never  . Drug use: Never  . Sexual activity: Yes  Lifestyle  . Physical activity:    Days per week: Not on file    Minutes per session: Not on file  . Stress: Not on file  Relationships  . Social connections:    Talks on phone: Not on file    Gets together: Not on file    Attends religious service: Not on file  Active member of club or organization: Not on file    Attends meetings of clubs or organizations: Not on file    Relationship status: Not on file  Other Topics Concern  . Not on file  Social History Narrative  . Not on file   Additional Social History:                         Sleep: Poor  Appetite:  Good  Current Medications: Current Facility-Administered Medications  Medication Dose Route Frequency Provider Last Rate Last Dose  . acetaminophen (TYLENOL) tablet 650 mg  650 mg Oral Q6H PRN Pucilowska, Jolanta B, MD   650 mg at 05/11/18 0112  . alum & mag hydroxide-simeth (MAALOX/MYLANTA) 200-200-20 MG/5ML suspension 30 mL  30 mL Oral Q4H PRN Pucilowska, Jolanta B, MD      . Derrill Memo ON 05/13/2018] ARIPiprazole  (ABILIFY) tablet 15 mg  15 mg Oral Daily Haruko Mersch R, MD      . diphenhydrAMINE (BENADRYL) capsule 50 mg  50 mg Oral Q6H PRN Pucilowska, Jolanta B, MD   50 mg at 05/11/18 0111   Or  . diphenhydrAMINE (BENADRYL) injection 50 mg  50 mg Intramuscular Q6H PRN Pucilowska, Jolanta B, MD      . divalproex (DEPAKOTE) DR tablet 500 mg  500 mg Oral Q breakfast Mouna Yager R, MD   500 mg at 05/12/18 0931   And  . divalproex (DEPAKOTE) DR tablet 1,000 mg  1,000 mg Oral QHS Brena Windsor R, MD   1,000 mg at 05/11/18 2126  . haloperidol (HALDOL) tablet 10 mg  10 mg Oral Q6H PRN Pucilowska, Jolanta B, MD   10 mg at 05/11/18 0112   Or  . haloperidol lactate (HALDOL) injection 10 mg  10 mg Intramuscular Q6H PRN Pucilowska, Jolanta B, MD      . hydrOXYzine (ATARAX/VISTARIL) tablet 50 mg  50 mg Oral Q6H PRN Pucilowska, Jolanta B, MD      . LORazepam (ATIVAN) tablet 2 mg  2 mg Oral Q6H PRN Pucilowska, Jolanta B, MD   2 mg at 05/11/18 2126   Or  . LORazepam (ATIVAN) injection 2 mg  2 mg Intramuscular Q6H PRN Pucilowska, Jolanta B, MD      . magnesium hydroxide (MILK OF MAGNESIA) suspension 30 mL  30 mL Oral Daily PRN Pucilowska, Jolanta B, MD      . temazepam (RESTORIL) capsule 15 mg  15 mg Oral QHS Parv Manthey, Tyson Babinski, MD        Lab Results: No results found for this or any previous visit (from the past 48 hour(s)).  Blood Alcohol level:  Lab Results  Component Value Date   ETH <10 05/07/2018   ETH <10 42/70/6237    Metabolic Disorder Labs: Lab Results  Component Value Date   HGBA1C 5.5 05/09/2018   MPG 111.15 05/09/2018   No results found for: PROLACTIN Lab Results  Component Value Date   CHOL 145 05/09/2018   TRIG 104 05/09/2018   HDL 35 (L) 05/09/2018   CHOLHDL 4.1 05/09/2018   VLDL 21 05/09/2018   LDLCALC 89 05/09/2018    Physical Findings: AIMS: Facial and Oral Movements Muscles of Facial Expression: None, normal Lips and Perioral Area: None, normal Jaw: None, normal Tongue: None,  normal,Extremity Movements Upper (arms, wrists, hands, fingers): None, normal Lower (legs, knees, ankles, toes): None, normal, Trunk Movements Neck, shoulders, hips: None, normal, Overall Severity Severity of abnormal movements (highest score from  questions above): None, normal Incapacitation due to abnormal movements: None, normal Patient's awareness of abnormal movements (rate only patient's report): No Awareness, Dental Status Current problems with teeth and/or dentures?: No Does patient usually wear dentures?: No  CIWA:    COWS:     Musculoskeletal: Strength & Muscle Tone: within normal limits Gait & Station: normal Patient leans: N/A  Psychiatric Specialty Exam: Physical Exam  Nursing note and vitals reviewed.   Review of Systems  All other systems reviewed and are negative.   Blood pressure (!) 119/99, pulse (!) 115, temperature 97.7 F (36.5 C), temperature source Oral, resp. rate 16, height 6' 2"  (1.88 m), weight 76.2 kg (168 lb), SpO2 100 %.Body mass index is 21.57 kg/m.  General Appearance: Casual  Eye Contact:  Fair  Speech:  Clear and Coherent  Volume:  Normal  Mood:  Irritable  Affect:  Appropriate, much less labile, not tearful today  Thought Process:  Disorganized but much improved  Orientation:  Full (Time, Place, and Person)  Thought Content:  Logical  Suicidal Thoughts:  No  Homicidal Thoughts:  No  Memory:  Immediate;   Fair  Judgement:  Impaired  Insight:  Lacking  Psychomotor Activity:  Normal  Concentration:  Concentration: Poor  Recall:  [employer] of Knowledge:  Fair  Language:  Fair  Akathisia:  No      Assets:  Resilience  ADL's:  Intact  Cognition:  WNL  Sleep:  Number of Hours: 4     Treatment Plan Summary: 47 yo male admitted due to acute manic episode. He is more organized and less labile today. However, still having outbursts at times if he does not get what he wants. Part of this may be related to autistic traits. He only slept 4  hours last night. He does complain of blurry vision and feeling unsteady. Possibly due to anti-cholinergic effects of Zyprexa. He is also drooling but much less so today.   Plan:  Bipolar disorder -Will switch Zyprexa to Abilify due to blurry vision. Will give 10 mg today and increase to 15 mg tomorrow -Continue Depakote 500 mg qam and 1000 mg qhs. Will check level tomorrow -Start Temazepam 15 mg qhs for sleep -EKG done yesterday, Normal sinus rhythm. QTc 418  Dispo -He will need outpatient follow up. I spoke with his son, Theresia Lo today for update.   Greater than 50% of face to face time with patient was spent on counseling and coordination of care. We discussed medication changes including Abilify and Depakote, coordinated care with son and discussed prognosis and medication changes. Also filled out short term disability paperwork.   Marylin Crosby, MD 05/12/2018, 11:03 AM

## 2018-05-13 LAB — VALPROIC ACID LEVEL: Valproic Acid Lvl: 101 ug/mL — ABNORMAL HIGH (ref 50.0–100.0)

## 2018-05-13 MED ORDER — DIVALPROEX SODIUM 250 MG PO DR TAB
250.0000 mg | DELAYED_RELEASE_TABLET | Freq: Every day | ORAL | Status: DC
  Administered 2018-05-14 – 2018-05-19 (×6): 250 mg via ORAL
  Filled 2018-05-13 (×6): qty 1

## 2018-05-13 MED ORDER — DIVALPROEX SODIUM 500 MG PO DR TAB
1000.0000 mg | DELAYED_RELEASE_TABLET | Freq: Every day | ORAL | Status: DC
  Administered 2018-05-13 – 2018-05-18 (×6): 1000 mg via ORAL
  Filled 2018-05-13 (×8): qty 2

## 2018-05-13 NOTE — Progress Notes (Signed)
Recreation Therapy Notes  Date: 05/13/2018  Time: 9:30 am  Location: Craft Room  Behavioral response: Appropriate   Intervention Topic: Animal Assisted Therapy  Discussion/Intervention:  Patient participated in Animal Assisted Therapy during group today. Group facilitator defined Animal Assisted Therapy as the use of animals as a therapeutic tool to assist a person in restoring balance to their life.  The group facilitator also described the benefits of Animal Assisted Therapy as improving patients' mental, physical, social and emotional functioning with the aid of animals; depending on the needs of the patient. Individuals in the group were able to pet the dogs as well as ask questions.  Clinical Observations/Feedback:  Patient came to group and was engaged with staff, peers and dogs. Individual was social and positive during group. Participant was on topic and asked appropriate questions. Carita Sollars LRT/CTRS         Jalaiyah Throgmorton 05/13/2018 10:47 AM

## 2018-05-13 NOTE — Progress Notes (Signed)
Patient awake noted pacing down the hallway with eyes closed saying " l can't walk and l can't see I need a wheelchair" Patient inspected no distress noted able to navigate around the unit, ROM X 4, he was redirected and assisted back to the room. bandaide intact to both heels, no drainage noted 15 minutes safety checks maintained will continue to monitor.

## 2018-05-13 NOTE — Plan of Care (Signed)
  Problem: Education: Goal: Knowledge of [facility] Education information/materials will improve Outcome: Progressing   Problem: Activity: Goal: Interest or engagement in activities will improve Outcome: Progressing Note:  Attending groups, up for meals, in dayroom with peers watching TB   Problem: Coping: Goal: Ability to verbalize frustrations and anger appropriately will improve Outcome: Progressing Goal: Ability to demonstrate self-control will improve Outcome: Progressing Note:  [place]   Problem: Safety: Goal: Periods of time without injury will increase Outcome: Progressing Note:  Remains safe on the unit   Problem: Education: Goal: Knowledge of General Education information will improve Outcome: Progressing   Problem: Health Behavior/Discharge Planning: Goal: Ability to manage health-related needs will improve Outcome: Progressing   Problem: Nutrition: Goal: Adequate nutrition will be maintained Outcome: Progressing   Problem: Coping: Goal: Level of anxiety will decrease Outcome: Progressing

## 2018-05-13 NOTE — Progress Notes (Signed)
D: Pt denies SI/HI/AVH, affect is flat but brightens upon approach. Patient's interaction is childlike, he was noted having temper tantrums  When he was in line for medications or whenever he was denied something or reminded of the rules and regulations of the unit.   Patient appears less anxious, visible in milieu but minimal interaction with peers and staff.  A: Pt was offered support and encouragement, and scheduled medications. Pt was in addition  encouraged to attend groups. 15 minute checks were done for safety.  R:Pt attends groups but does not interact with peers and staff. Pt is complaint with medication. Sfety maintained on unit, will continue to monitor.

## 2018-05-13 NOTE — Progress Notes (Signed)
[facility] MD Progress Note  05/13/2018 1:07 PM LETRELL ATTWOOD  MRN:  [id number] Subjective:  Per RN reports, he is having some temper tantrums when he does not get what he wants or if there is a delay in his routine. Upon evaluation today, he is much calmer in conversation. Childlike demeanor appears improved during interaction, even his tone of voice. He states that he is feeling better. He does admit to having outbursts when things dont go his way. He states that he is really trying to work on this by using deep breathing techniques. He is going to try to continue these and practice them. He states that he has had these behaviors for man years. He feels the medications are helpful in keeping him calm. He denies SI or HI. He enjoyed pet therapy very much today.   Principal Problem: Bipolar I disorder, current or most recent episode manic, with psychotic features ([place]) Diagnosis:   Patient Active Problem List   Diagnosis Date Noted  . Bipolar I disorder, current or most recent episode manic, with psychotic features ([place]) [F31.2]    Total Time spent with patient: 20 minutes  Past Psychiatric History: See h&P  Past Medical History:  Past Medical History:  Diagnosis Date  . Autism   . Autism    History reviewed. No pertinent surgical history. Family History: History reviewed. No pertinent family history. Family Psychiatric  History: See H&P Social History:  Social History   Substance and Sexual Activity  Alcohol Use Never  . Frequency: Never     Social History   Substance and Sexual Activity  Drug Use Never    Social History   Socioeconomic History  . Marital status: Married    Spouse name: Not on file  . Number of children: Not on file  . Years of education: Not on file  . Highest education level: Not on file  Occupational History  . Not on file  Social Needs  . Financial resource strain: Not on file  . Food insecurity:    Worry: Not on file    Inability: Not on file   . Transportation needs:    Medical: Not on file    Non-medical: Not on file  Tobacco Use  . Smoking status: Never Smoker  . Smokeless tobacco: Never Used  Substance and Sexual Activity  . Alcohol use: Never    Frequency: Never  . Drug use: Never  . Sexual activity: Yes  Lifestyle  . Physical activity:    Days per week: Not on file    Minutes per session: Not on file  . Stress: Not on file  Relationships  . Social connections:    Talks on phone: Not on file    Gets together: Not on file    Attends religious service: Not on file    Active member of club or organization: Not on file    Attends meetings of clubs or organizations: Not on file    Relationship status: Not on file  Other Topics Concern  . Not on file  Social History Narrative  . Not on file   Additional Social History:                         Sleep: Fair  Appetite:  Fair  Current Medications: Current Facility-Administered Medications  Medication Dose Route Frequency Provider Last Rate Last Dose  . acetaminophen (TYLENOL) tablet 650 mg  650 mg Oral Q6H PRN Pucilowska, Jolanta B,  MD   650 mg at 05/11/18 0112  . alum & mag hydroxide-simeth (MAALOX/MYLANTA) 200-200-20 MG/5ML suspension 30 mL  30 mL Oral Q4H PRN Pucilowska, Jolanta B, MD      . ARIPiprazole (ABILIFY) tablet 15 mg  15 mg Oral Daily Jesscia Imm, Tyson Babinski, MD   15 mg at 05/13/18 0850  . diphenhydrAMINE (BENADRYL) capsule 50 mg  50 mg Oral Q6H PRN Pucilowska, Jolanta B, MD   50 mg at 05/12/18 2142   Or  . diphenhydrAMINE (BENADRYL) injection 50 mg  50 mg Intramuscular Q6H PRN Pucilowska, Jolanta B, MD      . divalproex (DEPAKOTE) DR tablet 500 mg  500 mg Oral Q breakfast Theoden Mauch, Tyson Babinski, MD   500 mg at 05/13/18 0850   And  . divalproex (DEPAKOTE) DR tablet 1,000 mg  1,000 mg Oral QHS Shonia Skilling R, MD   1,000 mg at 05/12/18 2141  . haloperidol (HALDOL) tablet 10 mg  10 mg Oral Q6H PRN Pucilowska, Jolanta B, MD   10 mg at 05/12/18 2142   Or  .  haloperidol lactate (HALDOL) injection 10 mg  10 mg Intramuscular Q6H PRN Pucilowska, Jolanta B, MD      . hydrOXYzine (ATARAX/VISTARIL) tablet 50 mg  50 mg Oral Q6H PRN Pucilowska, Jolanta B, MD      . LORazepam (ATIVAN) tablet 2 mg  2 mg Oral Q6H PRN Pucilowska, Jolanta B, MD   2 mg at 05/12/18 2142   Or  . LORazepam (ATIVAN) injection 2 mg  2 mg Intramuscular Q6H PRN Pucilowska, Jolanta B, MD      . magnesium hydroxide (MILK OF MAGNESIA) suspension 30 mL  30 mL Oral Daily PRN Pucilowska, Jolanta B, MD      . temazepam (RESTORIL) capsule 15 mg  15 mg Oral QHS Hernando Reali, Tyson Babinski, MD   15 mg at 05/12/18 2142    Lab Results:  Results for orders placed or performed during the hospital encounter of 05/08/18 (from the past 48 hour(s))  Valproic acid level     Status: Abnormal   Collection Time: 05/13/18  7:11 AM  Result Value Ref Range   Valproic Acid Lvl 101 (H) 50.0 - 100.0 ug/mL    Comment: Performed at [facility], [address] [place], [place] [address]    Blood Alcohol level:  Lab Results  Component Value Date   [facility] <10 05/07/2018   ETH <10 35/46/5681    Metabolic Disorder Labs: Lab Results  Component Value Date   HGBA1C 5.5 05/09/2018   MPG 111.15 05/09/2018   No results found for: PROLACTIN Lab Results  Component Value Date   CHOL 145 05/09/2018   TRIG 104 05/09/2018   HDL 35 (L) 05/09/2018   CHOLHDL 4.1 05/09/2018   VLDL 21 05/09/2018   LDLCALC 89 05/09/2018    Physical Findings: AIMS: Facial and Oral Movements Muscles of Facial Expression: None, normal Lips and Perioral Area: None, normal Jaw: None, normal Tongue: None, normal,Extremity Movements Upper (arms, wrists, hands, fingers): None, normal Lower (legs, knees, ankles, toes): None, normal, Trunk Movements Neck, shoulders, hips: None, normal, Overall Severity Severity of abnormal movements (highest score from questions above): None, normal Incapacitation due to abnormal movements: None,  normal Patient's awareness of abnormal movements (rate only patient's report): No Awareness, Dental Status Current problems with teeth and/or dentures?: No Does patient usually wear dentures?: No  CIWA:    COWS:     Musculoskeletal: Strength & Muscle Tone: within normal limits  Gait & Station: normal Patient leans: Backward and N/A  Psychiatric Specialty Exam: Physical Exam  ROS  Blood pressure 119/87, pulse 98, temperature 98.1 F (36.7 C), temperature source Oral, resp. rate 18, height 6' 2"  (1.88 m), weight 76.2 kg (168 lb), SpO2 100 %.Body mass index is 21.57 kg/m.  General Appearance: Casual  Eye Contact:  Fair  Speech:  Clear and Coherent  Volume:  Normal  Mood:  Euthymic  Affect:  Appropriate  Thought Process:  Coherent and Goal Directed  Orientation:  Full (Time, Place, and Person)  Thought Content:  Logical  Suicidal Thoughts:  No  Homicidal Thoughts:  No  Memory:  Immediate;   Fair  Judgement:  Fair  Insight:  Fair  Psychomotor Activity:  Normal  Concentration:  Concentration: Fair  Recall:  [employer] of Knowledge:  Fair  Language:  Fair  Akathisia:  No      Assets:  Resilience  ADL's:  Intact  Cognition:  WNL  Sleep:  Number of Hours: 5.15     Treatment Plan Summary: 47 yo male admitted due to acute mania. He still has some occasional outbursts related to not getting what he wants or change in his routine. These symptoms are likely related to his diagnosis of autistic spectrum disorder and low frustration tolerance. HE is sleeping slightly better and is getting more organized each day. He appears less childlike today in conversation. He has pretty good insight into what is going on and need for medications. He is drooling much less.   Plan:  Bipolar disorder -Increase Abilify to 15 mg starting today -Decrease Depakote to 250 mg qam and 1000 mg qpm. Level was 101  Dispo -He will return home with family when stable. He will need outpatient follow  up  Marylin Crosby, MD 05/13/2018, 1:07 PM

## 2018-05-13 NOTE — Plan of Care (Signed)
  Problem: Coping: Goal: Ability to verbalize frustrations and anger appropriately will improve Outcome: Progressing  Patient able to verbalize frustrations to staff.

## 2018-05-13 NOTE — BHH Group Notes (Signed)
  05/13/2018  Time: 1PM  Type of Therapy/Topic:  Group Therapy:  Balance in Life  Participation Level:  Active  Description of Group:   This group will address the concept of balance and how it feels and looks when one is unbalanced. Patients will be encouraged to process areas in their lives that are out of balance and identify reasons for remaining unbalanced. Facilitators will guide patients in utilizing problem-solving interventions to address and correct the stressor making their life unbalanced. Understanding and applying boundaries will be explored and addressed for obtaining and maintaining a balanced life. Patients will be encouraged to explore ways to assertively make their unbalanced needs known to significant others in their lives, using other group members and facilitator for support and feedback.  Therapeutic Goals: 1. Patient will identify two or more emotions or situations they have that consume much of in their lives. 2. Patient will identify signs/triggers that life has become out of balance:  3. Patient will identify two ways to set boundaries in order to achieve balance in their lives:  4. Patient will demonstrate ability to communicate their needs through discussion and/or role plays  Summary of Patient Progress: Pt continues to work towards their tx goals but has not yet reached them. Pt was able to appropriately participate in group discussion, and was able to offer support/validation to other group members. Pt reported one area of his life he devotes too much attention to is, "my mood swings." Pt reported one area of his life he'd like to devote more attention to is, "my mental health."    Therapeutic Modalities:   Cognitive Behavioral Therapy Solution-Focused Therapy Assertiveness Training  Alden Hipp, MSW, LCSW Clinical Social Worker 05/13/2018 2:13 PM

## 2018-05-13 NOTE — BHH Group Notes (Signed)
LCSW Group Therapy Note 05/13/2018 9:00 AM  Type of Therapy and Topic:  Group Therapy:  Setting Goals  Participation Level:  Active  Description of Group: In this process group, patients discussed using strengths to work toward goals and address challenges.  Patients identified two positive things about themselves and one goal they were working on.  Patients were given the opportunity to share openly and support each other's plan for self-empowerment.  The group discussed the value of gratitude and were encouraged to have a daily reflection of positive characteristics or circumstances.  Patients were encouraged to identify a plan to utilize their strengths to work on current challenges and goals.  Therapeutic Goals 1. Patient will verbalize personal strengths/positive qualities and relate how these can assist with achieving desired personal goals 2. Patients will verbalize affirmation of peers plans for personal change and goal setting 3. Patients will explore the value of gratitude and positive focus as related to successful achievement of goals 4. Patients will verbalize a plan for regular reinforcement of personal positive qualities and circumstances.  Summary of Patient Progress: Steven Knight was able to actively participate in today's group on setting goals using the SMART Model.  Steven Knight shared that one goal that he started working on prior to coming into the hospital was to work on decreasing his anxiety, frustration, and getting upset really quickly.  Steven Knight shared that this goal is relevant to him as it will help improve his relationship with his wife.  Steven Knight shared that he has started to use mindfulness skills (listening to others more directly), following directions, and using coping strategies such as deep breathing.  Steven Knight shared that he will continue to work on these goals while in the hospital.      [place], [place] 05/13/2018 12:25 PM

## 2018-05-14 MED ORDER — ARIPIPRAZOLE 10 MG PO TABS
20.0000 mg | ORAL_TABLET | Freq: Every day | ORAL | Status: DC
  Administered 2018-05-15 – 2018-05-19 (×5): 20 mg via ORAL
  Filled 2018-05-14 (×5): qty 2

## 2018-05-14 MED ORDER — TEMAZEPAM 15 MG PO CAPS
30.0000 mg | ORAL_CAPSULE | Freq: Every day | ORAL | Status: DC
  Administered 2018-05-14 – 2018-05-16 (×3): 30 mg via ORAL
  Filled 2018-05-14 (×4): qty 2

## 2018-05-14 NOTE — Progress Notes (Signed)
[facility] MD Progress Note  05/14/2018 9:51 AM Steven Knight  MRN:  [id number]   Subjective: Pt has been calm on the unit. He did not have any outbursts over night. HE states that he is practicing his coping skills. He is organized and goal directed today. He is a bit more emotional today. He has asked appropriate questions about all his medications. He is worried about his job and wonders when he can return. He denies SI or thoughts of self harm. He slept 6.5 hours last night.   I spoke with his wife today. She states taht he was improving each day. However,r this morning on he phone he was extremely emotional and crying. He was perseverating on losing his job and asking over and over again if she was safe. He would not get off this topic. He was telling her that he was not sleeping well at night. However, he told me today that he slept great.  Principal Problem: Bipolar I disorder, current or most recent episode manic, with psychotic features ([place]) Diagnosis:   Patient Active Problem List   Diagnosis Date Noted  . Bipolar I disorder, current or most recent episode manic, with psychotic features ([place]) [F31.2]    Total Time spent with patient: 20 minutes  Past Psychiatric History: See h&P  Past Medical History:  Past Medical History:  Diagnosis Date  . Autism   . Autism    History reviewed. No pertinent surgical history. Family History: History reviewed. No pertinent family history. Family Psychiatric  History: See H&P Social History:  Social History   Substance and Sexual Activity  Alcohol Use Never  . Frequency: Never     Social History   Substance and Sexual Activity  Drug Use Never    Social History   Socioeconomic History  . Marital status: Married    Spouse name: Not on file  . Number of children: Not on file  . Years of education: Not on file  . Highest education level: Not on file  Occupational History  . Not on file  Social Needs  . Financial resource  strain: Not on file  . Food insecurity:    Worry: Not on file    Inability: Not on file  . Transportation needs:    Medical: Not on file    Non-medical: Not on file  Tobacco Use  . Smoking status: Never Smoker  . Smokeless tobacco: Never Used  Substance and Sexual Activity  . Alcohol use: Never    Frequency: Never  . Drug use: Never  . Sexual activity: Yes  Lifestyle  . Physical activity:    Days per week: Not on file    Minutes per session: Not on file  . Stress: Not on file  Relationships  . Social connections:    Talks on phone: Not on file    Gets together: Not on file    Attends religious service: Not on file    Active member of club or organization: Not on file    Attends meetings of clubs or organizations: Not on file    Relationship status: Not on file  Other Topics Concern  . Not on file  Social History Narrative  . Not on file   Additional Social History:                         Sleep: Fair  Appetite:  Fair  Current Medications: Current Facility-Administered Medications  Medication Dose Route Frequency  Provider Last Rate Last Dose  . acetaminophen (TYLENOL) tablet 650 mg  650 mg Oral Q6H PRN Pucilowska, Jolanta B, MD   650 mg at 05/11/18 0112  . alum & mag hydroxide-simeth (MAALOX/MYLANTA) 200-200-20 MG/5ML suspension 30 mL  30 mL Oral Q4H PRN Pucilowska, Jolanta B, MD      . ARIPiprazole (ABILIFY) tablet 15 mg  15 mg Oral Daily Ceaser Ebeling, Tyson Babinski, MD   15 mg at 05/14/18 0911  . diphenhydrAMINE (BENADRYL) capsule 50 mg  50 mg Oral Q6H PRN Pucilowska, Jolanta B, MD   50 mg at 05/12/18 2142   Or  . diphenhydrAMINE (BENADRYL) injection 50 mg  50 mg Intramuscular Q6H PRN Pucilowska, Jolanta B, MD      . divalproex (DEPAKOTE) DR tablet 250 mg  250 mg Oral Q breakfast Roxie Gueye R, MD   250 mg at 05/14/18 0912   And  . divalproex (DEPAKOTE) DR tablet 1,000 mg  1,000 mg Oral QHS Arville Postlewaite R, MD   1,000 mg at 05/13/18 2055  . haloperidol (HALDOL)  tablet 10 mg  10 mg Oral Q6H PRN Pucilowska, Jolanta B, MD   10 mg at 05/12/18 2142   Or  . haloperidol lactate (HALDOL) injection 10 mg  10 mg Intramuscular Q6H PRN Pucilowska, Jolanta B, MD      . hydrOXYzine (ATARAX/VISTARIL) tablet 50 mg  50 mg Oral Q6H PRN Pucilowska, Jolanta B, MD      . LORazepam (ATIVAN) tablet 2 mg  2 mg Oral Q6H PRN Pucilowska, Jolanta B, MD   2 mg at 05/12/18 2142   Or  . LORazepam (ATIVAN) injection 2 mg  2 mg Intramuscular Q6H PRN Pucilowska, Jolanta B, MD      . magnesium hydroxide (MILK OF MAGNESIA) suspension 30 mL  30 mL Oral Daily PRN Pucilowska, Jolanta B, MD      . temazepam (RESTORIL) capsule 15 mg  15 mg Oral QHS Toren Tucholski, Tyson Babinski, MD   15 mg at 05/13/18 2055    Lab Results:  Results for orders placed or performed during the hospital encounter of 05/08/18 (from the past 48 hour(s))  Valproic acid level     Status: Abnormal   Collection Time: 05/13/18  7:11 AM  Result Value Ref Range   Valproic Acid Lvl 101 (H) 50.0 - 100.0 ug/mL    Comment: Performed at [facility], [address] [place], [place] [address]    Blood Alcohol level:  Lab Results  Component Value Date   [facility] <10 05/07/2018   ETH <10 66/59/9357    Metabolic Disorder Labs: Lab Results  Component Value Date   HGBA1C 5.5 05/09/2018   MPG 111.15 05/09/2018   No results found for: PROLACTIN Lab Results  Component Value Date   CHOL 145 05/09/2018   TRIG 104 05/09/2018   HDL 35 (L) 05/09/2018   CHOLHDL 4.1 05/09/2018   VLDL 21 05/09/2018   LDLCALC 89 05/09/2018    Physical Findings: AIMS: Facial and Oral Movements Muscles of Facial Expression: None, normal Lips and Perioral Area: None, normal Jaw: None, normal Tongue: None, normal,Extremity Movements Upper (arms, wrists, hands, fingers): None, normal Lower (legs, knees, ankles, toes): None, normal, Trunk Movements Neck, shoulders, hips: None, normal, Overall Severity Severity of abnormal movements (highest score  from questions above): None, normal Incapacitation due to abnormal movements: None, normal Patient's awareness of abnormal movements (rate only patient's report): No Awareness, Dental Status Current problems with teeth and/or dentures?: No Does patient usually  wear dentures?: No  CIWA:    COWS:     Musculoskeletal: Strength & Muscle Tone: within normal limits Gait & Station: normal Patient leans: N/A  Psychiatric Specialty Exam: Physical Exam  Nursing note and vitals reviewed.   Review of Systems  All other systems reviewed and are negative.   Blood pressure 113/82, pulse 86, temperature 98 F (36.7 C), temperature source Oral, resp. rate 18, height 6' 2"  (1.88 m), weight 76.2 kg (168 lb), SpO2 100 %.Body mass index is 21.57 kg/m.  General Appearance: Casual  Eye Contact:  Fair  Speech:  Clear and Coherent  Volume:  Normal  Mood:  Lbile  Affect:  Labile  Thought Process:  Coherent and Goal Directed  Orientation:  Full (Time, Place, and Person)  Thought Content:  Logical  Suicidal Thoughts:  No  Homicidal Thoughts:  No  Memory:  Immediate;   Fair  Judgement:  Fair  Insight:  Fair  Psychomotor Activity:  Normal  Concentration:  Concentration: Fair  Recall:  [employer] of Knowledge:  Fair  Language:  Fair  Akathisia:  No      Assets:  Resilience  ADL's:  Intact  Cognition:  WNL  Sleep:  Number of Hours: 6.45     Treatment Plan Summary: 47 yo male admitted due to acute mania. He is improving but still labile and childlike at times. He is sleeping better.   Plan:  Bipolar I disorder -Increase Abilify to 20 mg daily -Continue lower dose of Depakote 250 mg qam and 1000 mg qhs. (Depakote level was 101 so dose was decreased) -Increase Temazepam to 30 mg qhs  Dispo -CSW looking into follow up for him.   Marylin Crosby, MD 05/14/2018, 9:51 AM

## 2018-05-14 NOTE — Plan of Care (Signed)
Pt calm and cooperative. Pt medication compliant. Pt wanted medications right at 9 so he could go to bed. Pt went to group. Pt denies SI/HI. Pt does endorses feeling better. Pt is receptive to treatment and safety maintained on unit. Will continue to monitor. Problem: Education: Goal: Knowledge of [facility] Education information/materials will improve Outcome: Progressing   Problem: Activity: Goal: Interest or engagement in activities will improve Outcome: Progressing   Problem: Coping: Goal: Ability to verbalize frustrations and anger appropriately will improve Outcome: Progressing Goal: Ability to demonstrate self-control will improve Outcome: Progressing

## 2018-05-14 NOTE — BHH Group Notes (Signed)
05/14/2018 1PM  Type of Therapy and Topic:  Group Therapy:  Feelings around Relapse and Recovery  Participation Level:  Active   Description of Group:    Patients in this group will discuss emotions they experience before and after a relapse. They will process how experiencing these feelings, or avoidance of experiencing them, relates to having a relapse. Facilitator will guide patients to explore emotions they have related to recovery. Patients will be encouraged to process which emotions are more powerful. They will be guided to discuss the emotional reaction significant others in their lives may have to patients' relapse or recovery. Patients will be assisted in exploring ways to respond to the emotions of others without this contributing to a relapse.  Therapeutic Goals: 1. Patient will identify two or more emotions that lead to a relapse for them 2. Patient will identify two emotions that result when they relapse 3. Patient will identify two emotions related to recovery 4. Patient will demonstrate ability to communicate their needs through discussion and/or role plays   Summary of Patient Progress: Actively and appropriately engaged in the group. Patient was able to provide support and validation to other group members.Patient practiced active listening when interacting with the facilitator and other group members. Patient spoke about his anger and what lead up to his mental health relapse prior to being admitted. He reports "I need to work on self-control in my recovery."  Patient is still in the process of obtaining treatment goals.      Therapeutic Modalities:   Cognitive Behavioral Therapy Solution-Focused Therapy Assertiveness Training Relapse Prevention Therapy   Darin Engels, [place] 05/14/2018 1:52 PM

## 2018-05-14 NOTE — Progress Notes (Signed)
Recreation Therapy Notes  Date: 05/14/2018  Time: 9:30 am  Location: Craft Room  Behavioral response: Appropriate   Intervention Topic: Coping Skills  Discussion/Intervention:  Group content on today was focused on coping skills. The group defined what coping skills are and when they can be used. Individuals described how they normally cope with things and the coping skills they normally use. Patients expressed why it is important to cope with things and how not coping with things can affect you. The group participated in the intervention "My coping box" and made coping boxes while adding coping skills they could use in the future to the box.  Clinical Observations/Feedback:  Patient came to group and was focused on what his peers and staff had to say about coping skills. He participated in the intervention and was social with peers and staff during group. Selestino Nila LRT/CTRS         Othel Hoogendoorn 05/14/2018 12:12 PM

## 2018-05-14 NOTE — Plan of Care (Signed)
Pt calm and cooperative. Pt denies SI/HI. Pt compliant with medications. Pt stated he has cuts on the bottom of his feet. Pt stated this makes it hard to walk a lot of the time. Helped patient put Band-Aids on them and no bleeding. Pt is receptive to treatment and safety maintained on unit. Will continue to monitor.   Problem: Education: Goal: Knowledge of [facility] Education information/materials will improve Outcome: Progressing   Problem: Coping: Goal: Ability to verbalize frustrations and anger appropriately will improve Outcome: Progressing Goal: Ability to demonstrate self-control will improve Outcome: Progressing   Problem: Safety: Goal: Periods of time without injury will increase Outcome: Progressing   Problem: Safety: Goal: Ability to remain free from injury will improve Outcome: Progressing

## 2018-05-14 NOTE — Tx Team (Signed)
Interdisciplinary Treatment and Diagnostic Plan Update  05/14/2018 Time of Session: 11am Steven Knight MRN: [id number]  Principal Diagnosis: Bipolar I disorder, current or most recent episode manic, with psychotic features ([place])  Secondary Diagnoses: Principal Problem:   Bipolar I disorder, current or most recent episode manic, with psychotic features ([place])   Current Medications:  Current Facility-Administered Medications  Medication Dose Route Frequency Provider Last Rate Last Dose  . acetaminophen (TYLENOL) tablet 650 mg  650 mg Oral Q6H PRN Pucilowska, Jolanta B, MD   650 mg at 05/11/18 0112  . alum & mag hydroxide-simeth (MAALOX/MYLANTA) 200-200-20 MG/5ML suspension 30 mL  30 mL Oral Q4H PRN Pucilowska, Jolanta B, MD      . Derrill Memo ON 05/15/2018] ARIPiprazole (ABILIFY) tablet 20 mg  20 mg Oral Daily McNew, Holly R, MD      . diphenhydrAMINE (BENADRYL) capsule 50 mg  50 mg Oral Q6H PRN Pucilowska, Jolanta B, MD   50 mg at 05/12/18 2142   Or  . diphenhydrAMINE (BENADRYL) injection 50 mg  50 mg Intramuscular Q6H PRN Pucilowska, Jolanta B, MD      . divalproex (DEPAKOTE) DR tablet 250 mg  250 mg Oral Q breakfast McNew, Holly R, MD   250 mg at 05/14/18 0912   And  . divalproex (DEPAKOTE) DR tablet 1,000 mg  1,000 mg Oral QHS McNew, Holly R, MD   1,000 mg at 05/13/18 2055  . haloperidol (HALDOL) tablet 10 mg  10 mg Oral Q6H PRN Pucilowska, Jolanta B, MD   10 mg at 05/12/18 2142   Or  . haloperidol lactate (HALDOL) injection 10 mg  10 mg Intramuscular Q6H PRN Pucilowska, Jolanta B, MD      . hydrOXYzine (ATARAX/VISTARIL) tablet 50 mg  50 mg Oral Q6H PRN Pucilowska, Jolanta B, MD      . LORazepam (ATIVAN) tablet 2 mg  2 mg Oral Q6H PRN Pucilowska, Jolanta B, MD   2 mg at 05/12/18 2142   Or  . LORazepam (ATIVAN) injection 2 mg  2 mg Intramuscular Q6H PRN Pucilowska, Jolanta B, MD      . magnesium hydroxide (MILK OF MAGNESIA) suspension 30 mL  30 mL Oral Daily PRN Pucilowska, Jolanta B,  MD      . temazepam (RESTORIL) capsule 30 mg  30 mg Oral QHS McNew, Tyson Babinski, MD       PTA Medications: Medications Prior to Admission  Medication Sig Dispense Refill Last Dose  . clonazePAM (KLONOPIN) 0.5 MG tablet Take 1 tablet (0.5 mg total) by mouth 2 (two) times daily as needed for anxiety. 14 tablet 0 unknown at unknown  . PARoxetine (PAXIL) 20 MG tablet Take 1 tablet (20 mg total) by mouth daily for 14 days. 14 tablet 0 unknown at unknown  . traZODone (DESYREL) 50 MG tablet Take 1 tablet (50 mg total) by mouth at bedtime as needed for sleep. 14 tablet 0 unknown at unknown    Patient Stressors: Traumatic event  Patient Strengths: Ability for insight Average or above average intelligence Capable of independent living Communication skills Supportive family/friends  Treatment Modalities: Medication Management, Group therapy, Case management,  1 to 1 session with clinician, Psychoeducation, Recreational therapy.   Physician Treatment Plan for Primary Diagnosis: Bipolar I disorder, current or most recent episode manic, with psychotic features ([place]) Long Term Goal(s): Improvement in symptoms so as ready for discharge NA   Short Term Goals: Ability to identify changes in lifestyle to reduce recurrence of condition will improve Ability  to verbalize feelings will improve Ability to disclose and discuss suicidal ideas Ability to demonstrate self-control will improve Ability to identify and develop effective coping behaviors will improve Ability to maintain clinical measurements within normal limits will improve Compliance with prescribed medications will improve Ability to identify triggers associated with substance abuse/mental health issues will improve NA  Medication Management: Evaluate patient's response, side effects, and tolerance of medication regimen.  Therapeutic Interventions: 1 to 1 sessions, Unit Group sessions and Medication administration.  Evaluation of Outcomes:  Progressing  Physician Treatment Plan for Secondary Diagnosis: Principal Problem:   Bipolar I disorder, current or most recent episode manic, with psychotic features ([place])  Long Term Goal(s): Improvement in symptoms so as ready for discharge NA   Short Term Goals: Ability to identify changes in lifestyle to reduce recurrence of condition will improve Ability to verbalize feelings will improve Ability to disclose and discuss suicidal ideas Ability to demonstrate self-control will improve Ability to identify and develop effective coping behaviors will improve Ability to maintain clinical measurements within normal limits will improve Compliance with prescribed medications will improve Ability to identify triggers associated with substance abuse/mental health issues will improve NA     Medication Management: Evaluate patient's response, side effects, and tolerance of medication regimen.  Therapeutic Interventions: 1 to 1 sessions, Unit Group sessions and Medication administration.  Evaluation of Outcomes: Progressing   RN Treatment Plan for Primary Diagnosis: Bipolar I disorder, current or most recent episode manic, with psychotic features ([place]) Long Term Goal(s): Knowledge of disease and therapeutic regimen to maintain health will improve  Short Term Goals: Ability to verbalize feelings will improve, Ability to identify and develop effective coping behaviors will improve and Compliance with prescribed medications will improve  Medication Management: RN will administer medications as ordered by provider, will assess and evaluate patient's response and provide education to patient for prescribed medication. RN will report any adverse and/or side effects to prescribing provider.  Therapeutic Interventions: 1 on 1 counseling sessions, Psychoeducation, Medication administration, Evaluate responses to treatment, Monitor vital signs and CBGs as ordered, Perform/monitor CIWA, COWS, AIMS and  Fall Risk screenings as ordered, Perform wound care treatments as ordered.  Evaluation of Outcomes: Progressing   LCSW Treatment Plan for Primary Diagnosis: Bipolar I disorder, current or most recent episode manic, with psychotic features ([place]) Long Term Goal(s): Safe transition to appropriate next level of care at discharge, Engage patient in therapeutic group addressing interpersonal concerns.  Short Term Goals: Engage patient in aftercare planning with referrals and resources, Increase social support, Identify triggers associated with mental health/substance abuse issues and Increase skills for wellness and recovery  Therapeutic Interventions: Assess for all discharge needs, 1 to 1 time with Social worker, Explore available resources and support systems, Assess for adequacy in community support network, Educate family and significant other(s) on suicide prevention, Complete Psychosocial Assessment, Interpersonal group therapy.  Evaluation of Outcomes: Progressing   Progress in Treatment: Attending groups: Yes. Participating in groups: Yes. Taking medication as prescribed: Yes. Toleration medication: Yes. Family/Significant other contact made: Yes, individual(s) contacted:  Wife Patient understands diagnosis: Yes. Discussing patient identified problems/goals with staff: Yes. Medical problems stabilized or resolved: Yes. Denies suicidal/homicidal ideation: Yes. Issues/concerns per patient self-inventory: No. Other:   New problem(s) identified: No, Describe:  None  New Short Term/Long Term Goal(s):  Patient Goals:  N/A  Discharge Plan or Barriers: To return home and follow up with an outpatient provider.  Reason for Continuation of Hospitalization: Mania Medication stabilization  Estimated  Length of Stay: 3-5 days  Recreational Therapy: Patient Stressors: N/A    Patient Goal: Patient will identify 3 positive coping skills strategies to use post d/c within 5 recreation  therapy group sessions  Attendees: Patient: 05/14/2018 1:26 PM  Physician: Dr. Wonda Olds, MD 05/14/2018 1:26 PM  Nursing: Elige Radon, RN 05/14/2018 1:26 PM  RN Care Manager: 05/14/2018 1:26 PM  Social Worker: Darin Engels, [place] 05/14/2018 1:26 PM  Recreational Therapist: Isaias Sakai. Marcello Fennel, LRT 05/14/2018 1:26 PM  Other: Derrek Gu, LCSW 05/14/2018 1:26 PM  Other: Alden Hipp, LCSW 05/14/2018 1:26 PM  Other: 05/14/2018 1:26 PM    Scribe for Treatment Team: Alden Hipp, LCSW 05/14/2018 1:26 PM

## 2018-05-14 NOTE — Progress Notes (Signed)
Received Steven Knight this AM after breakfast, he was compliant with his medications. He denied all of the psychiatric symptoms this AM. He is out of bed in the milieu with his peers and attending the group therapy sessions. He is expecting his son later today to visit and pick up his disability forms.-

## 2018-05-14 NOTE — BHH Group Notes (Signed)
[place] Group Notes:  (Nursing/MHT/Case Management/Adjunct)  Date:  05/14/2018  Time:  9:40 PM  Type of Therapy:  Group Therapy  Participation Level:  Active  Participation Quality:  Appropriate  Affect:  Appropriate  Cognitive:  Alert  Insight:  Appropriate  Engagement in Group:  Engaged and Enjoy craft today.  Modes of Intervention:  Support  Summary of Progress/Problems:  Steven Knight 05/14/2018, 9:40 PM

## 2018-05-15 NOTE — Progress Notes (Signed)
Received Steven Knight this AM after breakfast, he was compliant with his medications. He denied all of the psychiatric symptoms this AM including feeling suicidal. He is OOB in the milieu with his peers.No change in his status this PM.

## 2018-05-15 NOTE — Plan of Care (Signed)
Patient was pleasant on approach, he stills is somewhat anxious although on approach.He appears to be resting in bed quietly at this time.

## 2018-05-15 NOTE — BHH Group Notes (Signed)
[place] Group Notes:  (Nursing/MHT/Case Management/Adjunct)  Date:  05/15/2018  Time:  9:17 PM  Type of Therapy:  Group Therapy  Participation Level:  Active  Participation Quality:  Appropriate  Affect:  Appropriate  Cognitive:  Appropriate  Insight:  Appropriate  Engagement in Group:  Engaged  Modes of Intervention:  Discussion  Summary of Progress/Problems:  Steven Knight 05/15/2018, 9:17 PM

## 2018-05-15 NOTE — Progress Notes (Signed)
[facility] MD Progress Note  05/15/2018 4:39 PM TRUST LEH  MRN:  [id number]   Subjective:  Pt is somewhat anxious, but pleasant. Shellie Goettl reports feeling much better, denied racing thoughts. Swain Acree said that Sharman Garrott slept well last night.  Ausencio Vaden feels that the meds are very helpful, and wants to continue. Zikeria Keough said that Areona Homer wants to take care of himself first before returning to work.  No oversedation.  Maryon Kemnitz wanted to make sure that the writer doesn't change any of his medications and would like Dr. Wonda Olds make any changes if necessary.  Jasen Hartstein is reassured that no med changes would be made over the weekend as long as Tranell Wojtkiewicz is doing well.   No SI, no HI, no AVH.  No active Manic Sx.   Principal Problem: Bipolar I disorder, current or most recent episode manic, with psychotic features ([place]) Diagnosis:   Patient Active Problem List   Diagnosis Date Noted  . Bipolar I disorder, current or most recent episode manic, with psychotic features ([place]) [F31.2]    Total Time spent with patient: 20 minutes  Past Psychiatric History: See h&P  Past Medical History:  Past Medical History:  Diagnosis Date  . Autism   . Autism    History reviewed. No pertinent surgical history. Family History: History reviewed. No pertinent family history. Family Psychiatric  History: See H&P Social History:  Social History   Substance and Sexual Activity  Alcohol Use Never  . Frequency: Never     Social History   Substance and Sexual Activity  Drug Use Never    Social History   Socioeconomic History  . Marital status: Married    Spouse name: Not on file  . Number of children: Not on file  . Years of education: Not on file  . Highest education level: Not on file  Occupational History  . Not on file  Social Needs  . Financial resource strain: Not on file  . Food insecurity:    Worry: Not on file    Inability: Not on file  . Transportation needs:    Medical: Not on file    Non-medical: Not on file  Tobacco Use  . Smoking  status: Never Smoker  . Smokeless tobacco: Never Used  Substance and Sexual Activity  . Alcohol use: Never    Frequency: Never  . Drug use: Never  . Sexual activity: Yes  Lifestyle  . Physical activity:    Days per week: Not on file    Minutes per session: Not on file  . Stress: Not on file  Relationships  . Social connections:    Talks on phone: Not on file    Gets together: Not on file    Attends religious service: Not on file    Active member of club or organization: Not on file    Attends meetings of clubs or organizations: Not on file    Relationship status: Not on file  Other Topics Concern  . Not on file  Social History Narrative  . Not on file   Additional Social History:   Sleep: Good  Appetite:  Good  Current Medications: Current Facility-Administered Medications  Medication Dose Route Frequency Provider Last Rate Last Dose  . acetaminophen (TYLENOL) tablet 650 mg  650 mg Oral Q6H PRN Pucilowska, Jolanta B, MD   650 mg at 05/11/18 0112  . alum & mag hydroxide-simeth (MAALOX/MYLANTA) 200-200-20 MG/5ML suspension 30 mL  30 mL Oral Q4H PRN Pucilowska, Jolanta B, MD      .  ARIPiprazole (ABILIFY) tablet 20 mg  20 mg Oral Daily McNew, Tyson Babinski, MD   20 mg at 05/15/18 0807  . diphenhydrAMINE (BENADRYL) capsule 50 mg  50 mg Oral Q6H PRN Pucilowska, Jolanta B, MD   50 mg at 05/12/18 2142   Or  . diphenhydrAMINE (BENADRYL) injection 50 mg  50 mg Intramuscular Q6H PRN Pucilowska, Jolanta B, MD      . divalproex (DEPAKOTE) DR tablet 250 mg  250 mg Oral Q breakfast McNew, Holly R, MD   250 mg at 05/15/18 3329   And  . divalproex (DEPAKOTE) DR tablet 1,000 mg  1,000 mg Oral QHS McNew, Holly R, MD   1,000 mg at 05/14/18 2054  . haloperidol (HALDOL) tablet 10 mg  10 mg Oral Q6H PRN Pucilowska, Jolanta B, MD   10 mg at 05/12/18 2142   Or  . haloperidol lactate (HALDOL) injection 10 mg  10 mg Intramuscular Q6H PRN Pucilowska, Jolanta B, MD      . hydrOXYzine (ATARAX/VISTARIL)  tablet 50 mg  50 mg Oral Q6H PRN Pucilowska, Jolanta B, MD      . LORazepam (ATIVAN) tablet 2 mg  2 mg Oral Q6H PRN Pucilowska, Jolanta B, MD   2 mg at 05/12/18 2142   Or  . LORazepam (ATIVAN) injection 2 mg  2 mg Intramuscular Q6H PRN Pucilowska, Jolanta B, MD      . magnesium hydroxide (MILK OF MAGNESIA) suspension 30 mL  30 mL Oral Daily PRN Pucilowska, Jolanta B, MD      . temazepam (RESTORIL) capsule 30 mg  30 mg Oral QHS McNew, Tyson Babinski, MD   30 mg at 05/14/18 2054    Lab Results:  No results found for this or any previous visit (from the past 48 hour(s)).  Blood Alcohol level:  Lab Results  Component Value Date   ETH <10 05/07/2018   ETH <10 51/88/4166    Metabolic Disorder Labs: Lab Results  Component Value Date   HGBA1C 5.5 05/09/2018   MPG 111.15 05/09/2018   No results found for: PROLACTIN Lab Results  Component Value Date   CHOL 145 05/09/2018   TRIG 104 05/09/2018   HDL 35 (L) 05/09/2018   CHOLHDL 4.1 05/09/2018   VLDL 21 05/09/2018   LDLCALC 89 05/09/2018    Physical Findings: AIMS: Facial and Oral Movements Muscles of Facial Expression: None, normal Lips and Perioral Area: None, normal Jaw: None, normal Tongue: None, normal,Extremity Movements Upper (arms, wrists, hands, fingers): None, normal Lower (legs, knees, ankles, toes): None, normal, Trunk Movements Neck, shoulders, hips: None, normal, Overall Severity Severity of abnormal movements (highest score from questions above): None, normal Incapacitation due to abnormal movements: None, normal Patient's awareness of abnormal movements (rate only patient's report): No Awareness, Dental Status Current problems with teeth and/or dentures?: No Does patient usually wear dentures?: No  CIWA:    COWS:     Musculoskeletal: Strength & Muscle Tone: within normal limits Gait & Station: normal Patient leans: N/A  Psychiatric Specialty Exam: Physical Exam  Nursing note and vitals reviewed.   Review of  Systems  All other systems reviewed and are negative.   Blood pressure (!) 137/110, pulse 98, temperature 97.7 F (36.5 C), temperature source Oral, resp. rate 18, height 6' 2"  (1.88 m), weight 76.2 kg (168 lb), SpO2 100 %.Body mass index is 21.57 kg/m.  General Appearance: Casual and Fairly Groomed  Eye Contact:  Good  Speech:  Clear and Coherent  Volume:  Normal  Mood:  Anxious and Depressed  Affect:  Blunt, Constricted and Depressed  Thought Process:  Coherent, Goal Directed and Linear  Orientation:  Full (Time, Place, and Person)  Thought Content:  Logical  Suicidal Thoughts:  No  Homicidal Thoughts:  No  Memory:  Immediate;   Fair Recent;   Fair Remote;   Fair  Judgement:  Good  Insight:  Good and Fair  Psychomotor Activity:  Normal  Concentration:  Concentration: Fair and Attention Span: Fair  Recall:  Good  Fund of Knowledge:  Fair  Language:  Fair  Akathisia:  No      Assets:  Communication Skills Housing Resilience Social Support  ADL's:  Intact  Cognition:  WNL  Sleep:  Number of Hours: 7   Treatment Plan Summary: 47 yo male with bipolar I disorder admitted due to acute mania in the context of med non-compliant. Since restarting his med, Shalena Ezzell is mood have been improving.  Ellina Sivertsen tolerates meds without complaints, no oversedation.    Plan:  Bipolar I disorder -continue  Abilify to 20 mg daily -Continue lower dose of Depakote 250 mg qam and 1000 mg qhs. (Depakote level was 101 so dose was decreased) -continue Temazepam to 30 mg qhs, avoid long-term use if possible given the risks of habit forming.   Dispo -CSW looking into follow up for him.   Rayhan Groleau, MD 05/15/2018, 4:39 PM

## 2018-05-15 NOTE — BHH Group Notes (Signed)
LCSW Group Therapy Note  05/15/2018 1:15pm  Type of Therapy and Topic: Group Therapy: Holding on to Grudges   Participation Level: Active   Description of Group:  In this group patients will be asked to explore and define a grudge. Patients will be guided to discuss their thoughts, feelings, and reasons as to why people have grudges. Patients will process the impact grudges have on daily life and identify thoughts and feelings related to holding grudges. Facilitator will challenge patients to identify ways to let go of grudges and the benefits this provides. Patients will be confronted to address why one struggles letting go of grudges. Lastly, patients will identify feelings and thoughts related to what life would look like without grudges. This group will be process-oriented, with patients participating in exploration of their own experiences, giving and receiving support, and processing challenge from other group members.  Therapeutic Goals:  1. Patient will identify specific grudges related to their personal life.  2. Patient will identify feelings, thoughts, and beliefs around grudges.  3. Patient will identify how one releases grudges appropriately.  4. Patient will identify situations where they could have let go of the grudge, but instead chose to hold on.   Summary of Patient Progress: The patient scored his mood at a "15" (10 best). He stated he is trying to stay busy. Patients were guided to discuss their thoughts, feelings, and reasons as to why people have grudges. The patient was able to process the impact grudges have on daily life and identified thoughts and feelings related to holding grudges. The patient shared with the group that he is working with his anger and trying to control "the hulk in him" because he does not want to hurt his family. The patient was challenged to identify ways to let go of grudges and the benefits this provides. Pt. actively and appropriately engaged in  the group. Patient was able to provide support and validation to other group members.   Therapeutic Modalities:  Cognitive Behavioral Therapy  Solution Focused Therapy  Motivational Interviewing  Brief Therapy   Abbe Bula  CUEBAS-COLON, LCSW 05/15/2018 12:54 PM

## 2018-05-16 NOTE — Progress Notes (Addendum)
[facility] MD Progress Note  05/16/2018 3:02 PM ABNER ARDIS  MRN:  [id number]   Subjective:  Pt is calm and cooperative, very pleasant. Burnie Hank reports feeling good, attend our Group and like it.  Luci Bellucci said that Mersadie Kavanaugh eats and sleeps well, tolerates his meds without problems.  Gillian Kluever has not need any ativan prn.  Edwardo Wojnarowski doesn't want any changes to his medications.   Later on, Avin Gibbons approach the writer and wanted to know if Ruben Pyka can be discharged in the middle of next week. Davyd Podgorski said that Destinie Thornsberry is feeling better and his son is coming to visit him tonight, and Pedro Oldenburg wants to have something positive to tell his son. Nell Gales is reassured that if Terrence Pizana continues to take his meds and engage in our Groups, the goal of being discharge in next week is reachable, but the final decision will be made by Dr. Wonda Olds.   No SI, no HI, no AVH.  No active Manic Sx.   Principal Problem: Bipolar I disorder, current or most recent episode manic, with psychotic features ([place]) Diagnosis:   Patient Active Problem List   Diagnosis Date Noted  . Bipolar I disorder, current or most recent episode manic, with psychotic features ([place]) [F31.2]    Total Time spent with patient: 20 minutes  Past Psychiatric History: See h&P  Past Medical History:  Past Medical History:  Diagnosis Date  . Autism   . Autism    History reviewed. No pertinent surgical history. Family History: History reviewed. No pertinent family history. Family Psychiatric  History: See H&P Social History:  Social History   Substance and Sexual Activity  Alcohol Use Never  . Frequency: Never     Social History   Substance and Sexual Activity  Drug Use Never    Social History   Socioeconomic History  . Marital status: Married    Spouse name: Not on file  . Number of children: Not on file  . Years of education: Not on file  . Highest education level: Not on file  Occupational History  . Not on file  Social Needs  . Financial resource strain: Not on file  . Food  insecurity:    Worry: Not on file    Inability: Not on file  . Transportation needs:    Medical: Not on file    Non-medical: Not on file  Tobacco Use  . Smoking status: Never Smoker  . Smokeless tobacco: Never Used  Substance and Sexual Activity  . Alcohol use: Never    Frequency: Never  . Drug use: Never  . Sexual activity: Yes  Lifestyle  . Physical activity:    Days per week: Not on file    Minutes per session: Not on file  . Stress: Not on file  Relationships  . Social connections:    Talks on phone: Not on file    Gets together: Not on file    Attends religious service: Not on file    Active member of club or organization: Not on file    Attends meetings of clubs or organizations: Not on file    Relationship status: Not on file  Other Topics Concern  . Not on file  Social History Narrative  . Not on file   Additional Social History:   Sleep: Good  Appetite:  Good  Current Medications: Current Facility-Administered Medications  Medication Dose Route Frequency Provider Last Rate Last Dose  . acetaminophen (TYLENOL) tablet 650 mg  650 mg Oral Q6H PRN Pucilowska,  Jolanta B, MD   650 mg at 05/11/18 0112  . alum & mag hydroxide-simeth (MAALOX/MYLANTA) 200-200-20 MG/5ML suspension 30 mL  30 mL Oral Q4H PRN Pucilowska, Jolanta B, MD      . ARIPiprazole (ABILIFY) tablet 20 mg  20 mg Oral Daily McNew, Tyson Babinski, MD   20 mg at 05/16/18 3267  . diphenhydrAMINE (BENADRYL) capsule 50 mg  50 mg Oral Q6H PRN Pucilowska, Jolanta B, MD   50 mg at 05/12/18 2142   Or  . diphenhydrAMINE (BENADRYL) injection 50 mg  50 mg Intramuscular Q6H PRN Pucilowska, Jolanta B, MD      . divalproex (DEPAKOTE) DR tablet 250 mg  250 mg Oral Q breakfast McNew, Holly R, MD   250 mg at 05/16/18 1245   And  . divalproex (DEPAKOTE) DR tablet 1,000 mg  1,000 mg Oral QHS McNew, Holly R, MD   1,000 mg at 05/15/18 2125  . haloperidol (HALDOL) tablet 10 mg  10 mg Oral Q6H PRN Pucilowska, Jolanta B, MD   10 mg  at 05/12/18 2142   Or  . haloperidol lactate (HALDOL) injection 10 mg  10 mg Intramuscular Q6H PRN Pucilowska, Jolanta B, MD      . hydrOXYzine (ATARAX/VISTARIL) tablet 50 mg  50 mg Oral Q6H PRN Pucilowska, Jolanta B, MD   50 mg at 05/15/18 2126  . LORazepam (ATIVAN) tablet 2 mg  2 mg Oral Q6H PRN Pucilowska, Jolanta B, MD   2 mg at 05/12/18 2142   Or  . LORazepam (ATIVAN) injection 2 mg  2 mg Intramuscular Q6H PRN Pucilowska, Jolanta B, MD      . magnesium hydroxide (MILK OF MAGNESIA) suspension 30 mL  30 mL Oral Daily PRN Pucilowska, Jolanta B, MD      . temazepam (RESTORIL) capsule 30 mg  30 mg Oral QHS McNew, Tyson Babinski, MD   30 mg at 05/15/18 2125    Lab Results:  No results found for this or any previous visit (from the past 48 hour(s)).  Blood Alcohol level:  Lab Results  Component Value Date   ETH <10 05/07/2018   ETH <10 80/99/8338    Metabolic Disorder Labs: Lab Results  Component Value Date   HGBA1C 5.5 05/09/2018   MPG 111.15 05/09/2018   No results found for: PROLACTIN Lab Results  Component Value Date   CHOL 145 05/09/2018   TRIG 104 05/09/2018   HDL 35 (L) 05/09/2018   CHOLHDL 4.1 05/09/2018   VLDL 21 05/09/2018   LDLCALC 89 05/09/2018    Physical Findings: AIMS: Facial and Oral Movements Muscles of Facial Expression: None, normal Lips and Perioral Area: None, normal Jaw: None, normal Tongue: None, normal,Extremity Movements Upper (arms, wrists, hands, fingers): None, normal Lower (legs, knees, ankles, toes): None, normal, Trunk Movements Neck, shoulders, hips: None, normal, Overall Severity Severity of abnormal movements (highest score from questions above): None, normal Incapacitation due to abnormal movements: None, normal Patient's awareness of abnormal movements (rate only patient's report): No Awareness, Dental Status Current problems with teeth and/or dentures?: No Does patient usually wear dentures?: No  CIWA:    COWS:      Musculoskeletal: Strength & Muscle Tone: within normal limits Gait & Station: normal Patient leans: N/A  Psychiatric Specialty Exam: Physical Exam  Nursing note and vitals reviewed.   Review of Systems  All other systems reviewed and are negative.   Blood pressure (!) 122/91, pulse (!) 112, temperature 97.9 F (36.6 C), temperature source Oral, resp.  rate 16, height 6' 2"  (1.88 m), weight 76.2 kg (168 lb), SpO2 100 %.Body mass index is 21.57 kg/m.  General Appearance: Casual and Fairly Groomed  Eye Contact:  Fair  Speech:  Clear and Coherent and Normal Rate  Volume:  Normal  Mood:  Anxious and Depressed  Affect:  Appropriate and Full Range  Thought Process:  Goal Directed and Linear  Orientation:  Full (Time, Place, and Person)  Thought Content:  Logical  Suicidal Thoughts:  No  Homicidal Thoughts:  No  Memory:  Immediate;   Fair Recent;   Fair Remote;   Fair  Judgement:  Good  Insight:  Good and Fair  Psychomotor Activity:  Normal  Concentration:  Concentration: Fair and Attention Span: Fair  Recall:  Good  Fund of Knowledge:  Fair  Language:  Fair  Akathisia:  No      Assets:  Communication Skills Housing Resilience Social Support  ADL's:  Intact  Cognition:  WNL  Sleep:  Number of Hours: 6.5   Treatment Plan Summary: 47 yo male with bipolar I disorder admitted due to acute mania in the context of med non-compliant. Since restarting his med, Jes Costales is mood have been improving.  Dewie Ahart tolerates meds without complaints, no oversedation.    Plan:  Bipolar I disorder -continue  Abilify to 20 mg daily -Continue Depakote 250 mg qam and 1000 mg qhs. (Depakote level was 101 so dose was decreased) -continue Temazepam to 30 mg qhs, avoid long-term use if possible given the risks of habit forming.   Dispo -CSW looking into follow up for him.   Rashod Gougeon, MD 05/16/2018, 3:02 PM

## 2018-05-16 NOTE — BHH Group Notes (Signed)
[facility] Group Notes:  (Nursing/MHT/Case Management/Adjunct)  Date:  05/16/2018  Time:  9:32 PM  Type of Therapy:  Group Therapy  Participation Level:  Active  Participation Quality:  Appropriate  Affect:  Excited  Cognitive:  Appropriate  Insight:  Appropriate  Engagement in Group:  Engaged  Modes of Intervention:  Support  Summary of Progress/Problems:  Steven Knight 05/16/2018, 9:32 PM

## 2018-05-16 NOTE — BHH Group Notes (Signed)
LCSW Group Therapy Note 05/16/2018 1:15pm  Type of Therapy and Topic: Group Therapy: Feelings Around Returning Home & Establishing a Supportive Framework and Supporting Oneself When Supports Not Available  Participation Level: Active  Description of Group:  Patients first processed thoughts and feelings about upcoming discharge. These included fears of upcoming changes, lack of change, new living environments, judgements and expectations from others and overall stigma of mental health issues. The group then discussed the definition of a supportive framework, what that looks and feels like, and how do to discern it from an unhealthy non-supportive network. The group identified different types of supports as well as what to do when your family/friends are less than helpful or unavailable  Therapeutic Goals  1. Patient will identify one healthy supportive network that they can use at discharge. 2. Patient will identify one factor of a supportive framework and how to tell it from an unhealthy network. 3. Patient able to identify one coping skill to use when they do not have positive supports from others. 4. Patient will demonstrate ability to communicate their needs through discussion and/or role plays.  Summary of Patient Progress:  The patient reported he feels "excited on the inside." Pt. actively and appropriately engaged in the group. Patient was able to provide support and validation to other group members. As patients processed their anxiety about discharge and described healthy supports patient shared that he is ready to be discharge on Wednesday and get back to his "active life." Patients identified at least one self-care tool they were willing to use after discharge;  breathing exercise, and spending time outside.  Therapeutic Modalities Cognitive Behavioral Therapy Motivational Interviewing   Suhaylah Wampole  CUEBAS-COLON, LCSW 05/16/2018 9:13 AM

## 2018-05-17 MED ORDER — TEMAZEPAM 15 MG PO CAPS
15.0000 mg | ORAL_CAPSULE | Freq: Every day | ORAL | Status: DC
  Administered 2018-05-17: 15 mg via ORAL
  Filled 2018-05-17: qty 1

## 2018-05-17 MED ORDER — TRAZODONE HCL 100 MG PO TABS
100.0000 mg | ORAL_TABLET | Freq: Every evening | ORAL | Status: DC | PRN
  Administered 2018-05-17 – 2018-05-18 (×2): 100 mg via ORAL
  Filled 2018-05-17 (×2): qty 1

## 2018-05-17 NOTE — Plan of Care (Signed)
Patient progressing in all areas.

## 2018-05-17 NOTE — Plan of Care (Signed)
  Problem: Safety: Goal: Ability to remain free from injury will improve Outcome: Progressing  Patent is injury free, interacting appropriately with peers and staff

## 2018-05-17 NOTE — Progress Notes (Signed)
[facility] MD Progress Note  05/17/2018 12:16 PM Steven Knight  MRN:  [id number]   Subjective:  Pt states that he is feeling well. He is much calmer and less intrusive on the unit. He is sleeping better. He is much less labile during assessment today. He is able to have very rational conversation and has good insight. He states that he has been trying really hard to control any outbursts and is proud of himself that he has not had any outbursts in 4 days. He states that he is utilizing deep breathing to help him. He feels medications are very helpful for him to stay calm. He is doing well in groups. He is eating well. He states that he is feeling homesick and feeling "couped up in here." He is hoping he can spend time outside today. He adamantly denies SI or any thoughts of self harm. Denies HI or thoughts of harming others.   Principal Problem: Bipolar I disorder, current or most recent episode manic, with psychotic features ([place]) Diagnosis:   Patient Active Problem List   Diagnosis Date Noted  . Bipolar I disorder, current or most recent episode manic, with psychotic features ([place]) [F31.2]    Total Time spent with patient: 20 minutes  Past Psychiatric History: Seeh&P  Past Medical History:  Past Medical History:  Diagnosis Date  . Autism   . Autism    History reviewed. No pertinent surgical history. Family History: History reviewed. No pertinent family history. Family Psychiatric  History: See H&P Social History:  Social History   Substance and Sexual Activity  Alcohol Use Never  . Frequency: Never     Social History   Substance and Sexual Activity  Drug Use Never    Social History   Socioeconomic History  . Marital status: Married    Spouse name: Not on file  . Number of children: Not on file  . Years of education: Not on file  . Highest education level: Not on file  Occupational History  . Not on file  Social Needs  . Financial resource strain: Not on file  .  Food insecurity:    Worry: Not on file    Inability: Not on file  . Transportation needs:    Medical: Not on file    Non-medical: Not on file  Tobacco Use  . Smoking status: Never Smoker  . Smokeless tobacco: Never Used  Substance and Sexual Activity  . Alcohol use: Never    Frequency: Never  . Drug use: Never  . Sexual activity: Yes  Lifestyle  . Physical activity:    Days per week: Not on file    Minutes per session: Not on file  . Stress: Not on file  Relationships  . Social connections:    Talks on phone: Not on file    Gets together: Not on file    Attends religious service: Not on file    Active member of club or organization: Not on file    Attends meetings of clubs or organizations: Not on file    Relationship status: Not on file  Other Topics Concern  . Not on file  Social History Narrative  . Not on file   Additional Social History:                         Sleep: Fair  Appetite:  Good  Current Medications: Current Facility-Administered Medications  Medication Dose Route Frequency Provider Last Rate Last Dose  .  acetaminophen (TYLENOL) tablet 650 mg  650 mg Oral Q6H PRN Pucilowska, Jolanta B, MD   650 mg at 05/11/18 0112  . alum & mag hydroxide-simeth (MAALOX/MYLANTA) 200-200-20 MG/5ML suspension 30 mL  30 mL Oral Q4H PRN Pucilowska, Jolanta B, MD      . ARIPiprazole (ABILIFY) tablet 20 mg  20 mg Oral Daily Blaise Grieshaber, Tyson Babinski, MD   20 mg at 05/17/18 0811  . diphenhydrAMINE (BENADRYL) capsule 50 mg  50 mg Oral Q6H PRN Pucilowska, Jolanta B, MD   50 mg at 05/12/18 2142   Or  . diphenhydrAMINE (BENADRYL) injection 50 mg  50 mg Intramuscular Q6H PRN Pucilowska, Jolanta B, MD      . divalproex (DEPAKOTE) DR tablet 250 mg  250 mg Oral Q breakfast Nerida Boivin R, MD   250 mg at 05/17/18 6387   And  . divalproex (DEPAKOTE) DR tablet 1,000 mg  1,000 mg Oral QHS Zalen Sequeira R, MD   1,000 mg at 05/16/18 2121  . haloperidol (HALDOL) tablet 10 mg  10 mg Oral Q6H  PRN Pucilowska, Jolanta B, MD   10 mg at 05/12/18 2142   Or  . haloperidol lactate (HALDOL) injection 10 mg  10 mg Intramuscular Q6H PRN Pucilowska, Jolanta B, MD      . hydrOXYzine (ATARAX/VISTARIL) tablet 50 mg  50 mg Oral Q6H PRN Pucilowska, Jolanta B, MD   50 mg at 05/16/18 2121  . magnesium hydroxide (MILK OF MAGNESIA) suspension 30 mL  30 mL Oral Daily PRN Pucilowska, Jolanta B, MD      . temazepam (RESTORIL) capsule 15 mg  15 mg Oral QHS Matisyn Cabeza R, MD      . traZODone (DESYREL) tablet 100 mg  100 mg Oral QHS PRN Zelma Snead, Tyson Babinski, MD        Lab Results: No results found for this or any previous visit (from the past 48 hour(s)).  Blood Alcohol level:  Lab Results  Component Value Date   ETH <10 05/07/2018   ETH <10 56/43/3295    Metabolic Disorder Labs: Lab Results  Component Value Date   HGBA1C 5.5 05/09/2018   MPG 111.15 05/09/2018   No results found for: PROLACTIN Lab Results  Component Value Date   CHOL 145 05/09/2018   TRIG 104 05/09/2018   HDL 35 (L) 05/09/2018   CHOLHDL 4.1 05/09/2018   VLDL 21 05/09/2018   LDLCALC 89 05/09/2018    Physical Findings: AIMS: Facial and Oral Movements Muscles of Facial Expression: None, normal Lips and Perioral Area: None, normal Jaw: None, normal Tongue: None, normal,Extremity Movements Upper (arms, wrists, hands, fingers): None, normal Lower (legs, knees, ankles, toes): None, normal, Trunk Movements Neck, shoulders, hips: None, normal, Overall Severity Severity of abnormal movements (highest score from questions above): None, normal Incapacitation due to abnormal movements: None, normal Patient's awareness of abnormal movements (rate only patient's report): No Awareness, Dental Status Current problems with teeth and/or dentures?: No Does patient usually wear dentures?: No  CIWA:    COWS:     Musculoskeletal: Strength & Muscle Tone: within normal limits Gait & Station: normal Patient leans: N/A  Psychiatric  Specialty Exam: Physical Exam  Nursing note and vitals reviewed.   Review of Systems  All other systems reviewed and are negative.   Blood pressure 112/84, pulse (!) 118, temperature (!) 97.5 F (36.4 C), temperature source Oral, resp. rate 18, height 6' 2"  (1.88 m), weight 76.2 kg (168 lb), SpO2 100 %.Body mass index is 21.57  kg/m.  General Appearance: Casual  Eye Contact:  Fair  Speech:  Clear and Coherent  Volume:  Normal  Mood:  Euthymic  Affect:  Appropriate  Thought Process:  Coherent and Goal Directed  Orientation:  Full (Time, Place, and Person)  Thought Content:  Logical  Suicidal Thoughts:  No  Homicidal Thoughts:  No  Memory:  Immediate;   Fair  Judgement:  Fair  Insight:  Fair  Psychomotor Activity:  Normal  Concentration:  Concentration: Fair  Recall:  [employer] of Knowledge:  Fair  Language:  Fair  Akathisia:  No      Assets:  Resilience  ADL's:  Intact  Cognition:  WNL  Sleep:  Number of Hours: 6     Treatment Plan Summary: 47 yo male admitted due to manic symptoms. He is much calmer and manic symptoms are resolving. He is much less sedated on Zyprexa. He is sleeping better.   Plan:  Bipolar I disorder -Continue Depakote 250 mg qam and 1000 mg qhs -Continue Abilify 20 mg daily -Decrease temazepam to 15 mg qhs with plan to taper off. Add prn Trazodone for sleep  Dispo -he will return home when stable. I spoke with his wife [name] today to provide update and answer any questions she has  Marylin Crosby, MD 05/17/2018, 12:16 PM

## 2018-05-17 NOTE — Progress Notes (Signed)
Patient alert and orientated x 4, less intrusive, less pacing down the hallway during this shift, he appears less anxious, no distress noted,. Patient's thoughts are organized, 15 minutes safety checks maintained will continue to monitor.

## 2018-05-17 NOTE — BHH Group Notes (Signed)
[facility] Group Notes:  (Nursing/MHT/Case Management/Adjunct)  Date:  05/17/2018  Time:  9:26 PM  Type of Therapy:  Group Therapy  Participation Level:  Active  Participation Quality:  Appropriate  Affect:  Excited  Cognitive:  Appropriate  Insight:  Good  Engagement in Group:  Engaged  Modes of Intervention:  Support  Summary of Progress/Problems:  Steven Knight 05/17/2018, 9:26 PM

## 2018-05-17 NOTE — Progress Notes (Signed)
Patient is pleasant and cooperative in the unit.Patient came to staff to use the phone at 4pm because he told his mother that he will call at 4pm.Told patient that the phone will be on at 5pm and if mother have any questions she will call at the nurses station.Patient agreed with that.Visible in the milieu,appropriate  with peers.Compliant with medications.Appetite and energy level good.Attended groups.Support and encouragement given.

## 2018-05-17 NOTE — Progress Notes (Signed)
Recreation Therapy Notes  Date: 05/17/2018  Time: 9:30 am  Location: Craft Room  Behavioral response: Appropriate   Intervention Topic: Creative Expressions  Discussion/Intervention:  Group content on today was focused on creative expressions. The group defined creative expressions and ways they use creative expressions. Individual identified other positive ways creative expressions can be used and why it is important to express yourself. Patients participated in the intervention "exploring creative expressions", where they had a chance to creatively express themselves.  Clinical Observations/Feedback:  Patient came to group and identified shouting as a negative way he expresses himself. He identified deep breathing and playing with a pet as positive ways he expresses himself. Individual stated that if people do not express themselves others will not know what they need. Patient participated in the intervention and was social with peers and staff during group.  Tasharra Nodine LRT/CTRS         Hutton Pellicane 05/17/2018 12:06 PM

## 2018-05-18 MED ORDER — DIVALPROEX SODIUM 500 MG PO DR TAB: 1000.0000 mg | DELAYED_RELEASE_TABLET | Freq: Every day | ORAL | 0 refills | Status: DC

## 2018-05-18 MED ORDER — ARIPIPRAZOLE 20 MG PO TABS: 20.0000 mg | ORAL_TABLET | Freq: Every day | ORAL | 0 refills | Status: DC

## 2018-05-18 MED ORDER — TEMAZEPAM 7.5 MG PO CAPS
7.5000 mg | ORAL_CAPSULE | Freq: Every day | ORAL | Status: DC
  Administered 2018-05-18: 7.5 mg via ORAL
  Filled 2018-05-18: qty 1

## 2018-05-18 MED ORDER — TRAZODONE HCL 50 MG PO TABS: 50.0000 mg | ORAL_TABLET | Freq: Every evening | ORAL | 0 refills | Status: DC | PRN

## 2018-05-18 MED ORDER — DIVALPROEX SODIUM 250 MG PO DR TAB: 250.0000 mg | DELAYED_RELEASE_TABLET | Freq: Every day | ORAL | 0 refills | Status: DC

## 2018-05-18 NOTE — BHH Group Notes (Signed)
05/18/2018 1PM  Type of Therapy/Topic:  Group Therapy:  Feelings about Diagnosis  Participation Level:  Active   Description of Group:   This group will allow patients to explore their thoughts and feelings about diagnoses they have received. Patients will be guided to explore their level of understanding and acceptance of these diagnoses. Facilitator will encourage patients to process their thoughts and feelings about the reactions of others to their diagnosis and will guide patients in identifying ways to discuss their diagnosis with significant others in their lives. This group will be process-oriented, with patients participating in exploration of their own experiences, giving and receiving support, and processing challenge from other group members.   Therapeutic Goals: 1. Patient will demonstrate understanding of diagnosis as evidenced by identifying two or more symptoms of the disorder 2. Patient will be able to express two feelings regarding the diagnosis 3. Patient will demonstrate their ability to communicate their needs through discussion and/or role play  Summary of Patient Progress: Actively and appropriately engaged in the group. Patient practiced active listening when interacting with the facilitator and other group members. Tanner did well being a Metallurgist during the group activity. Patient is still in the process of obtaining treatment goals.        Therapeutic Modalities:   Cognitive Behavioral Therapy Brief Therapy Feelings Identification    Darin Engels, [place] 05/18/2018 2:07 PM

## 2018-05-18 NOTE — Plan of Care (Signed)
Patient is progressing towards all goals. He reports remaining free from outburst 5 days in a row, actively attends groups, and reports "drawing, music, and deep breathing" as coping skills.  Problem: Education: Goal: Knowledge of [place] [facility] Education information/materials will improve Outcome: Progressing   Problem: Activity: Goal: Interest or engagement in activities will improve Outcome: Progressing   Problem: Coping: Goal: Ability to verbalize frustrations and anger appropriately will improve Outcome: Progressing Goal: Ability to demonstrate self-control will improve Outcome: Progressing   Problem: Safety: Goal: Periods of time without injury will increase Outcome: Progressing   Problem: Education: Goal: Knowledge of General Education information will improve Outcome: Progressing   Problem: Health Behavior/Discharge Planning: Goal: Ability to manage health-related needs will improve Outcome: Progressing   Problem: Coping: Goal: Level of anxiety will decrease Outcome: Progressing   Problem: Safety: Goal: Ability to remain free from injury will improve Outcome: Progressing   Problem: Skin Integrity: Goal: Risk for impaired skin integrity will decrease Outcome: Progressing

## 2018-05-18 NOTE — Progress Notes (Signed)
Recreation Therapy Notes  Date: 05/17/2018  Time: 9:30 am  Location: Craft Room  Behavioral response: Appropriate   Intervention Topic: Stress  Discussion/Intervention:  Group content on today was focused on stress. The group defined stress and ways to cope with stress. Participants expressed how they know when they are stresses out and certain triggers that stress them out. Individuals described the different ways they have to cope with stress. The group stated reasons why it is important to cope with stress. Patient explained what good stress is and some examples. The group participated in the intervention "Managing Stress". Individuals were able to identify new ways to cope with stress.  Clinical Observations/Feedback:  Patient came to group and expressed that he tries to stay busy when he is stressed. Individual participated in the intervention and was social with peers and staff during group. Merrit Friesen LRT/CTRS         Melvin Whiteford 05/18/2018 11:49 AM

## 2018-05-18 NOTE — Progress Notes (Signed)
[facility] MD Progress Note  05/18/2018 8:17 AM Steven Knight  MRN:  [id number] Subjective: Pt slept well last night. He has been much calmer on the unit with no outbursts. He is tearful at times today because his mother was worried about him. He feels like the medications are helpful for him. He is spending time outside today playing basketball with peers. He is doing better in groups. He denies SI or thoughts of self harm. His thoughts are organized and goal directed.    Principal Problem: Bipolar I disorder, current or most recent episode manic, with psychotic features ([place]) Diagnosis:   Patient Active Problem List   Diagnosis Date Noted  . Bipolar I disorder, current or most recent episode manic, with psychotic features ([place]) [F31.2]    Total Time spent with patient: 20 minutes  Past Psychiatric History: See H&P  Past Medical History:  Past Medical History:  Diagnosis Date  . Autism   . Autism    History reviewed. No pertinent surgical history. Family History: History reviewed. No pertinent family history. Family Psychiatric  History: See H&P Social History:  Social History   Substance and Sexual Activity  Alcohol Use Never  . Frequency: Never     Social History   Substance and Sexual Activity  Drug Use Never    Social History   Socioeconomic History  . Marital status: Married    Spouse name: Not on file  . Number of children: Not on file  . Years of education: Not on file  . Highest education level: Not on file  Occupational History  . Not on file  Social Needs  . Financial resource strain: Not on file  . Food insecurity:    Worry: Not on file    Inability: Not on file  . Transportation needs:    Medical: Not on file    Non-medical: Not on file  Tobacco Use  . Smoking status: Never Smoker  . Smokeless tobacco: Never Used  Substance and Sexual Activity  . Alcohol use: Never    Frequency: Never  . Drug use: Never  . Sexual activity: Yes  Lifestyle   . Physical activity:    Days per week: Not on file    Minutes per session: Not on file  . Stress: Not on file  Relationships  . Social connections:    Talks on phone: Not on file    Gets together: Not on file    Attends religious service: Not on file    Active member of club or organization: Not on file    Attends meetings of clubs or organizations: Not on file    Relationship status: Not on file  Other Topics Concern  . Not on file  Social History Narrative  . Not on file   Additional Social History:                         Sleep: Good  Appetite:  Good  Current Medications: Current Facility-Administered Medications  Medication Dose Route Frequency Provider Last Rate Last Dose  . acetaminophen (TYLENOL) tablet 650 mg  650 mg Oral Q6H PRN Pucilowska, Jolanta B, MD   650 mg at 05/11/18 0112  . alum & mag hydroxide-simeth (MAALOX/MYLANTA) 200-200-20 MG/5ML suspension 30 mL  30 mL Oral Q4H PRN Pucilowska, Jolanta B, MD      . ARIPiprazole (ABILIFY) tablet 20 mg  20 mg Oral Daily Perseus Westall, Tyson Babinski, MD   20 mg at 05/18/18  2330  . diphenhydrAMINE (BENADRYL) capsule 50 mg  50 mg Oral Q6H PRN Pucilowska, Jolanta B, MD   50 mg at 05/12/18 2142   Or  . diphenhydrAMINE (BENADRYL) injection 50 mg  50 mg Intramuscular Q6H PRN Pucilowska, Jolanta B, MD      . divalproex (DEPAKOTE) DR tablet 250 mg  250 mg Oral Q breakfast Gaetan Spieker, Tyson Babinski, MD   250 mg at 05/18/18 0762   And  . divalproex (DEPAKOTE) DR tablet 1,000 mg  1,000 mg Oral QHS Maleke Feria R, MD   1,000 mg at 05/17/18 2142  . haloperidol (HALDOL) tablet 10 mg  10 mg Oral Q6H PRN Pucilowska, Jolanta B, MD   10 mg at 05/12/18 2142   Or  . haloperidol lactate (HALDOL) injection 10 mg  10 mg Intramuscular Q6H PRN Pucilowska, Jolanta B, MD      . hydrOXYzine (ATARAX/VISTARIL) tablet 50 mg  50 mg Oral Q6H PRN Pucilowska, Jolanta B, MD   50 mg at 05/17/18 2142  . magnesium hydroxide (MILK OF MAGNESIA) suspension 30 mL  30 mL Oral  Daily PRN Pucilowska, Jolanta B, MD      . temazepam (RESTORIL) capsule 15 mg  15 mg Oral QHS Anniemae Haberkorn, Tyson Babinski, MD   15 mg at 05/17/18 2142  . traZODone (DESYREL) tablet 100 mg  100 mg Oral QHS PRN Marylin Crosby, MD   100 mg at 05/17/18 2142    Lab Results: No results found for this or any previous visit (from the past 48 hour(s)).  Blood Alcohol level:  Lab Results  Component Value Date   ETH <10 05/07/2018   ETH <10 26/33/3545    Metabolic Disorder Labs: Lab Results  Component Value Date   HGBA1C 5.5 05/09/2018   MPG 111.15 05/09/2018   No results found for: PROLACTIN Lab Results  Component Value Date   CHOL 145 05/09/2018   TRIG 104 05/09/2018   HDL 35 (L) 05/09/2018   CHOLHDL 4.1 05/09/2018   VLDL 21 05/09/2018   LDLCALC 89 05/09/2018    Physical Findings: AIMS: Facial and Oral Movements Muscles of Facial Expression: None, normal Lips and Perioral Area: None, normal Jaw: None, normal Tongue: None, normal,Extremity Movements Upper (arms, wrists, hands, fingers): None, normal Lower (legs, knees, ankles, toes): None, normal, Trunk Movements Neck, shoulders, hips: None, normal, Overall Severity Severity of abnormal movements (highest score from questions above): None, normal Incapacitation due to abnormal movements: None, normal Patient's awareness of abnormal movements (rate only patient's report): No Awareness, Dental Status Current problems with teeth and/or dentures?: No Does patient usually wear dentures?: No  CIWA:    COWS:     Musculoskeletal: Strength & Muscle Tone: within normal limits Gait & Station: normal Patient leans: N/A  Psychiatric Specialty Exam: Physical Exam  Nursing note and vitals reviewed.   Review of Systems  All other systems reviewed and are negative.   Blood pressure (!) 152/96, pulse (!) 116, temperature 97.6 F (36.4 C), temperature source Oral, resp. rate 18, height 6' 2"  (1.88 m), weight 76.2 kg (168 lb), SpO2 100 %.Body mass  index is 21.57 kg/m.  General Appearance: Casual  Eye Contact:  Fair  Speech:  Clear and Coherent  Volume:  Normal  Mood:  Euthymic  Affect:  Tearful  Thought Process:  Coherent and Goal Directed  Orientation:  Full (Time, Place, and Person)  Thought Content:  Logical  Suicidal Thoughts:  No  Homicidal Thoughts:  No  Memory:  Recent;  Fair  Judgement:  Fair  Insight:  Fair  Psychomotor Activity:  Normal  Concentration:  Concentration: Fair  Recall:  [employer] of Knowledge:  Fair  Language:  Fair  Akathisia:  No      Assets:  Resilience  ADL's:  Intact  Cognition:  WNL  Sleep:  Number of Hours: 6     Treatment Plan Summary: 47 yo male admitted due to manic symptoms. These symptosm are resolving. His thoughts are much clearer and no longer having outbursts.  Plan:  Bipolar disorder -Continue Abilify 20 mg daily -Continue Depakote 250 mg qam and 1000 mg qhs. Will check level tomorrow -Decrease temazepam to 7.5 mg qhs with plan to discontinue -prn trazodone -EKG QTc 418  Dispo -He will likely discharge home tomorrow.  Marylin Crosby, MD 05/18/2018, 8:17 AM

## 2018-05-18 NOTE — Progress Notes (Signed)
Patient is calm and cooperative as he engages this Probation officer. He denies depression as well as SI/HI/AVH. He is progressing positively towards goals as he reports not having any outburst in 5 days, attends groups, and reports "drawing, music, and deep breathing" as coping skills. He is observed eating meals well with fluids. Emotional support provided. Will continue to monitor for care and safety.

## 2018-05-18 NOTE — Progress Notes (Signed)
Patient alert and orientated x 4, denies SI/HI/AVH, he appears less anxious, no distress noted, mood is pleasant, interacting appropriately with peers and staff appropriately.  Patient's thoughts are organized, speech is soft non tangential. 15 minutes safety checks maintained will continue to monitor

## 2018-05-18 NOTE — ED Provider Notes (Signed)
[place] DEPT Provider Note   CSN: [id number] Arrival date & time: 05/06/18  0849     History   Chief Complaint Chief Complaint  Patient presents with  . Paranoid  . Insomnia    HPI Steven Knight is a 47 y.o. male.  Chief complaint is behavior change, possible suicidal ideation.  HPI 47 year old male.  History of autism.  Highly functioning.  Works.  Started a new job working for [employer] few months ago.  Was approached by his boss a few days ago and had to be "retrain".  Apparently this is become quite stressful to him.  Also has 2 daughters in college.  Wife states this is only been sleeping 1 to 2 hours at a time.  He gets up and paces.  Today he was more agitated and confused.  He has spoken about being suicidal but has no active plan.  Wife states that he talks about things trying to get him or people being out to get him.  Paranoia.  Past Medical History:  Diagnosis Date  . Autism   . Autism     Patient Active Problem List   Diagnosis Date Noted  . Bipolar I disorder, current or most recent episode manic, with psychotic features ([place])     History reviewed. No pertinent surgical history.      Home Medications    Prior to Admission medications   Medication Sig Start Date End Date Taking? Authorizing Provider  clonazePAM (KLONOPIN) 0.5 MG tablet Take 1 tablet (0.5 mg total) by mouth 2 (two) times daily as needed for anxiety. 05/07/18 05/07/19  Faythe Dingwall, DO  PARoxetine (PAXIL) 20 MG tablet Take 1 tablet (20 mg total) by mouth daily for 14 days. 05/07/18 05/21/18  Faythe Dingwall, DO  traZODone (DESYREL) 50 MG tablet Take 1 tablet (50 mg total) by mouth at bedtime as needed for sleep. 05/07/18   Faythe Dingwall, DO    Family History No family history on file.  Social History Social History   Tobacco Use  . Smoking status: Never Smoker  . Smokeless tobacco: Never Used  Substance Use Topics  . Alcohol  use: Never    Frequency: Never  . Drug use: Never     Allergies   Patient has no known allergies.   Review of Systems Review of Systems  Constitutional: Negative for appetite change, chills, diaphoresis, fatigue and fever.  HENT: Negative for mouth sores, sore throat and trouble swallowing.   Eyes: Negative for visual disturbance.  Respiratory: Negative for cough, chest tightness, shortness of breath and wheezing.   Cardiovascular: Negative for chest pain.  Gastrointestinal: Negative for abdominal distention, abdominal pain, diarrhea, nausea and vomiting.  Endocrine: Negative for polydipsia, polyphagia and polyuria.  Genitourinary: Negative for dysuria, frequency and hematuria.  Musculoskeletal: Negative for gait problem.  Skin: Negative for color change, pallor and rash.  Neurological: Negative for dizziness, syncope, light-headedness and headaches.  Hematological: Does not bruise/bleed easily.  Psychiatric/Behavioral: Positive for behavioral problems, confusion, sleep disturbance and suicidal ideas.     Physical Exam Updated Vital Signs BP 125/88   Pulse 87   Temp 98.5 F (36.9 C) (Oral)   Resp 18   Ht 6' 2"  (1.88 m)   Wt 76.7 kg (169 lb)   SpO2 99%   BMI 21.70 kg/m   Physical Exam  Constitutional: He is oriented to person, place, and time. He appears well-developed and well-nourished. No distress.  HENT:  Head: Normocephalic.  Eyes: Pupils are equal, round, and reactive to light. Conjunctivae are normal. No scleral icterus.  Neck: Normal range of motion. Neck supple. No thyromegaly present.  Cardiovascular: Normal rate and regular rhythm. Exam reveals no gallop and no friction rub.  No murmur heard. Pulmonary/Chest: Effort normal and breath sounds normal. No respiratory distress. He has no wheezes. He has no rales.  Abdominal: Soft. Bowel sounds are normal. He exhibits no distension. There is no tenderness. There is no rebound.  Musculoskeletal: Normal range of  motion.  Neurological: He is alert and oriented to person, place, and time.  Skin: Skin is warm and dry. No rash noted.  Psychiatric: He has a normal mood and affect. His behavior is normal.     ED Treatments / Results  Labs (all labs ordered are listed, but only abnormal results are displayed) Labs Reviewed  COMPREHENSIVE METABOLIC PANEL - Abnormal; Notable for the following components:      Result Value   Potassium 3.1 (*)    Glucose, Bld 159 (*)    Calcium 8.4 (*)    All other components within normal limits  ACETAMINOPHEN LEVEL - Abnormal; Notable for the following components:   Acetaminophen (Tylenol), Serum <10 (*)    All other components within normal limits  CBC - Abnormal; Notable for the following components:   WBC 12.3 (*)    All other components within normal limits  ETHANOL  SALICYLATE LEVEL  RAPID URINE DRUG SCREEN, HOSP PERFORMED  CK    EKG None  Radiology No results found.  Procedures Procedures (including critical care time)  Medications Ordered in ED Medications  diphenhydrAMINE (BENADRYL) capsule 25 mg (25 mg Oral Given 05/06/18 1500)  LORazepam (ATIVAN) tablet 1 mg (1 mg Oral Given 05/07/18 0148)     Initial Impression / Assessment and Plan / ED Course  I have reviewed the triage vital signs and the nursing notes.  Pertinent labs & imaging results that were available during my care of the patient were reviewed by me and considered in my medical decision making (see chart for details).    Plan psychiatric evaluation.  Patient here voluntarily at this point.  Discussed process of psychiatric evaluation starting with medical clearance followed by [facility] evaluation.  Wife, daughter, and patient expressed understanding  Final Clinical Impressions(s) / ED Diagnoses   Final diagnoses:  Adjustment disorder with mixed disturbance of emotions and conduct    ED Discharge Orders        Ordered    Increase activity slowly     05/07/18 1242    Diet -  low sodium heart healthy     05/07/18 1242    PARoxetine (PAXIL) 20 MG tablet  Daily     05/07/18 1242    clonazePAM (KLONOPIN) 0.5 MG tablet  2 times daily PRN     05/07/18 1242    traZODone (DESYREL) 50 MG tablet  At bedtime PRN,   Status:  Discontinued     05/07/18 1245    traZODone (DESYREL) 50 MG tablet  At bedtime PRN     05/07/18 1245       Tanna Furry, MD 05/18/18 [phone]

## 2018-05-18 NOTE — [facility] Group Notes (Signed)
CSW Group Therapy Note  05/18/2018  Time:  0900  Type of Therapy and Topic: Group Therapy: Goals Group: SMART Goals    Participation Level:  Active    Description of Group:   The purpose of a daily goals group is to assist and guide patients in setting recovery/wellness-related goals. The objective is to set goals as they relate to the crisis in which they were admitted. Patients will be using SMART goal modalities to set measurable goals. Characteristics of realistic goals will be discussed and patients will be assisted in setting and processing how one will reach their goal. Facilitator will also assist patients in applying interventions and coping skills learned in psycho-education groups to the SMART goal and process how one will achieve defined goal.    Therapeutic Goals:  -Patients will develop and document one goal related to or their crisis in which brought them into treatment.  -Patients will be guided by LCSW using SMART goal setting modality in how to set a measurable, attainable, realistic and time sensitive goal.  -Patients will process barriers in reaching goal.  -Patients will process interventions in how to overcome and successful in reaching goal.    Patient's Goal:  Pt continues to work towards their tx goals but has not yet reached them. Pt was able to appropriately participate in group discussion, and was able to offer support/validation to other group members. Pt reported his goal is to, "be nicer by completing at least five acts of kindness by the end of today."    Therapeutic Modalities:  Motivational Interviewing  Cognitive Behavioral Therapy  Crisis Intervention Model  SMART goals setting  Alden Hipp, MSW, LCSW Clinical Social Worker 05/18/2018 9:34 AM

## 2018-05-18 NOTE — Plan of Care (Signed)
  Problem: Coping: Goal: Ability to verbalize frustrations and anger appropriately will improve Outcome: Progressing  Patient verbalized frustrations to staff.

## 2018-05-19 LAB — VALPROIC ACID LEVEL: Valproic Acid Lvl: 86 ug/mL (ref 50.0–100.0)

## 2018-05-19 NOTE — Discharge Summary (Signed)
Physician Discharge Summary Note  Patient:  Steven Knight is an 47 y.o., male MRN:  [id number] DOB:  04-20-1971 Patient phone:  [phone] (home)  Patient address:   [place] [address],  Total Time spent with patient: 20 minutes  Plus 20 minutes of medication reconciliation, discharge planning, and discharge documentation   Date of Admission:  05/08/2018 Date of Discharge: 05/19/18  Reason for Admission:  Mania  Principal Problem: Bipolar I disorder, current or most recent episode manic, with psychotic features [facility]) Discharge Diagnoses: Patient Active Problem List   Diagnosis Date Noted  . Bipolar I disorder, current or most recent episode manic, with psychotic features ([place]) [F31.2]     Past Psychiatric History: See H&P  Past Medical History:  Past Medical History:  Diagnosis Date  . Autism   . Autism    History reviewed. No pertinent surgical history. Family History: History reviewed. No pertinent family history. Family Psychiatric  History: See H&P Social History:  Social History   Substance and Sexual Activity  Alcohol Use Never  . Frequency: Never     Social History   Substance and Sexual Activity  Drug Use Never    Social History   Socioeconomic History  . Marital status: Married    Spouse name: Not on file  . Number of children: Not on file  . Years of education: Not on file  . Highest education level: Not on file  Occupational History  . Not on file  Social Needs  . Financial resource strain: Not on file  . Food insecurity:    Worry: Not on file    Inability: Not on file  . Transportation needs:    Medical: Not on file    Non-medical: Not on file  Tobacco Use  . Smoking status: Never Smoker  . Smokeless tobacco: Never Used  Substance and Sexual Activity  . Alcohol use: Never    Frequency: Never  . Drug use: Never  . Sexual activity: Yes  Lifestyle  . Physical activity:    Days per week: Not on file     Minutes per session: Not on file  . Stress: Not on file  Relationships  . Social connections:    Talks on phone: Not on file    Gets together: Not on file    Attends religious service: Not on file    Active member of club or organization: Not on file    Attends meetings of clubs or organizations: Not on file    Relationship status: Not on file  Other Topics Concern  . Not on file  Social History Narrative  . Not on file    Hospital Course:  Pt was initially started on Depakote and Zyprexa. His Depakote level was slightly elevated so decreased to 250 mgm qam and 1000 mg qhs. Depakote level on 05/19/18 was 86. He was overly sedated and drooling on Zyprexa. He also complained to blurry vision. This was switched to Abilify and titrated to 20 mg daily. He tolerated these medications well. He was able to learn and utilize coping skills to help with his frustration and patience. Some of the frustration and routine is attributed to autistic spectrum features. By day of discharge, he was much calmer on the unit. HE did not have any outbursts in several days. He was attending groups and participating well. He was interacting well with other peers. HE was sleeping much better and much less agitated and labile. His thoughts were organized  and goal directed. I was in contact with his family multiple times through hospitalization to answer questions and provide updates. He consistently denied SI or HI, including on day of discharge. All guns at his son in law will be secured, per his daughter. He was given 7 day supply of medications. I filled out short term disability paperwork for him. I let him and his family know that I recommend at least 4 weeks off from work until he stabilizes on medications since there is a lot of driving associated with it. Also because work was a major stressor for him. He will need evaluation prior to returning to work to assess for stability.   The patient is at low risk of imminent  suicide. Patient denied thoughts, intent, or plan for harm to self or others, expressed significant future orientation, and expressed an ability to mobilize assistance for his needs. he is presently void of any contributing psychiatric symptoms, cognitive difficulties, or substance use which would elevate his risk for lethality. Chronic risk for lethality is elevated in light of male gender, bipolar disorder. The chronic risk is presently mitigated by his ongoing desire and engagement in [facility] treatment and mobilization of support from family and friends. Chronic risk may elevate if he experiences any significant loss or worsening of symptoms, which can be managed and monitored through outpatient providers. At this time,a cute risk for lethality is low and he is stable for ongoing outpatient management.   Modifiable risk factors were addressed during this hospitalization through appropriate pharmacotherapy and establishment of outpatient follow-up treatment. Some risk factors for suicide are situational (i.e. Unstable housing) or related personality pathology (i.e. Poor coping mechanisms) and thus cannot be further mitigated by continued hospitalization in this setting.    Physical Findings: AIMS: Facial and Oral Movements Muscles of Facial Expression: None, normal Lips and Perioral Area: None, normal Jaw: None, normal Tongue: None, normal,Extremity Movements Upper (arms, wrists, hands, fingers): None, normal Lower (legs, knees, ankles, toes): None, normal, Trunk Movements Neck, shoulders, hips: None, normal, Overall Severity Severity of abnormal movements (highest score from questions above): None, normal Incapacitation due to abnormal movements: None, normal Patient's awareness of abnormal movements (rate only patient's report): No Awareness, Dental Status Current problems with teeth and/or dentures?: No Does patient usually wear dentures?: No  CIWA:    COWS:     Musculoskeletal: Strength &  Muscle Tone: within normal limits Gait & Station: normal Patient leans: N/A  Psychiatric Specialty Exam: Physical Exam  ROS  Blood pressure 118/79, pulse (!) 114, temperature 97.8 F (36.6 C), temperature source Oral, resp. rate 16, height 6' 2"  (1.88 m), weight 76.2 kg (168 lb), SpO2 100 %.Body mass index is 21.57 kg/m.  General Appearance: Casual  Eye Contact:  Good  Speech:  Clear and Coherent  Volume:  Normal  Mood:  Anxious  Affect:  Appropriate  Thought Process:  Coherent and Goal Directed  Orientation:  Full (Time, Place, and Person)  Thought Content:  Logical  Suicidal Thoughts:  No  Homicidal Thoughts:  No  Memory:  Immediate;   Fair  Judgement:  Fair  Insight:  Fair  Psychomotor Activity:  Normal  Concentration:  Concentration: Fair  Recall:  [employer] of Knowledge:  Fair  Language:  Fair  Akathisia:  No      Assets:  Resilience  ADL's:  Intact  Cognition:  WNL  Sleep:  Number of Hours: 5.5     Have you used any form of tobacco  in the last 30 days? (Cigarettes, Smokeless Tobacco, Cigars, and/or Pipes): No  Has this patient used any form of tobacco in the last 30 days? (Cigarettes, Smokeless Tobacco, Cigars, and/or Pipes) No  Blood Alcohol level:  Lab Results  Component Value Date   ETH <10 05/07/2018   ETH <10 58/25/1898    Metabolic Disorder Labs:  Lab Results  Component Value Date   HGBA1C 5.5 05/09/2018   MPG 111.15 05/09/2018   No results found for: PROLACTIN Lab Results  Component Value Date   CHOL 145 05/09/2018   TRIG 104 05/09/2018   HDL 35 (L) 05/09/2018   CHOLHDL 4.1 05/09/2018   VLDL 21 05/09/2018   LDLCALC 89 05/09/2018    See Psychiatric Specialty Exam and Suicide Risk Assessment completed by Attending Physician prior to discharge.  Discharge destination:  Home  Is patient on multiple antipsychotic therapies at discharge:  No   Has Patient had three or more failed trials of antipsychotic monotherapy by history:   No  Recommended Plan for Multiple Antipsychotic Therapies: NA   Allergies as of 05/19/2018   No Known Allergies     Medication List    STOP taking these medications   clonazePAM 0.5 MG tablet Commonly known as:  KLONOPIN   PARoxetine 20 MG tablet Commonly known as:  PAXIL     TAKE these medications     Indication  ARIPiprazole 20 MG tablet Commonly known as:  ABILIFY Take 1 tablet (20 mg total) by mouth daily.  Indication:  Manic Phase of Manic-Depression   divalproex 500 MG DR tablet Commonly known as:  DEPAKOTE Take 2 tablets (1,000 mg total) by mouth at bedtime.  Indication:  Manic Phase of Manic-Depression   divalproex 250 MG DR tablet Commonly known as:  DEPAKOTE Take 1 tablet (250 mg total) by mouth daily with breakfast.  Indication:  Manic Phase of Manic-Depression   traZODone 50 MG tablet Commonly known as:  DESYREL Take 1 tablet (50 mg total) by mouth at bedtime as needed for sleep.  Indication:  [facility]. Go on 05/20/2018.   Why:  Please go to your hospital follow up appointment on Thursday, 05/20/18 at [place]. Thank you! Contact information: [place] [address] [phone]           Follow-up recommendations: Follow up with [facility] Signed: Marylin Crosby, MD 05/19/2018, 8:58 AM

## 2018-05-19 NOTE — Plan of Care (Addendum)
Patient found in common area talking on the phone upon my arrival. Patient is visible and somewhat social throughout the evening. Patient denies all complaints including SI/HI/AVH. Mood and affect are brighter. Patient is less perseverative. Processing improved. When writer was busy with another patient, patient exhibits patience. Given Trazodone for sleep with positive results. Reports eating and voiding adequately. Compliant with HS medications and staff direction. Patient feels ready for discharge. Q 15 minute checks maintained. Will continue to monitor throughout the shift. Slept 5.5 hours. No apparent distress. Patient up and showered by 0530. Compliant with lab draw. Will endorse care to oncoming shift.  Problem: Education: Goal: Knowledge of [facility] Education information/materials will improve Outcome: Progressing   Problem: Activity: Goal: Interest or engagement in activities will improve Outcome: Progressing   Problem: Coping: Goal: Ability to verbalize frustrations and anger appropriately will improve Outcome: Progressing Goal: Ability to demonstrate self-control will improve Outcome: Progressing   Problem: Safety: Goal: Periods of time without injury will increase Outcome: Progressing   Problem: Education: Goal: Knowledge of General Education information will improve Outcome: Progressing   Problem: Health Behavior/Discharge Planning: Goal: Ability to manage health-related needs will improve Outcome: Progressing   Problem: Coping: Goal: Level of anxiety will decrease Outcome: Progressing

## 2018-05-19 NOTE — Discharge Instructions (Signed)
You will no longer take Paxil or Klonopin which you were taking at home.

## 2018-05-19 NOTE — Progress Notes (Signed)
Patient ID: Steven Knight, male   DOB: [birth year], 47 y.o.   MRN: [id number]   Discharge Note:  Patient denies SI/HI/AVH at this time. Discharge instructions, AVS, transition record, and prescriptions gone over with patient. Patient agrees to comply with medication management, follow-up visit, and outpatient therapy. Patient belongings returned to patient. Patient questions and concerns addressed and answered. Patient ambulatory off unit. Patient discharged to home with wife.

## 2018-05-19 NOTE — Progress Notes (Signed)
Recreation Therapy Notes   Date: 05/19/2018  Time: 9:30 am  Location: Craft Room  Behavioral response: Appropriate   Intervention Topic: Communication  Discussion/Intervention:  Group content today was focused on communication. The group defined communication and ways to communicate with others. Individuals stated reason why communication is important and some reasons to communicate with others. Patients expressed if they thought they were good at communicating with others and ways they could improve their communication skills. The group identified important parts of communication and some experiences they have had in the past with communication. The group participated in the intervention "What is that?", where they had a chance to test out their communication skills and identify ways to improve their communication techniques.   Clinical Observations/Feedback:  Patient came to group and identified writing , talking and hand signals as ways he communicates with others. Individual stated his anxiety keeps him fromn communicating well with others. He participated in the intervention and was social with peers and staff during group.             Seville Brick 05/19/2018 12:49 PM

## 2018-05-19 NOTE — BHH Suicide Risk Assessment (Signed)
[facility] Discharge Suicide Risk Assessment   Principal Problem: Bipolar I disorder, current or most recent episode manic, with psychotic features [facility]) Discharge Diagnoses:  Patient Active Problem List   Diagnosis Date Noted  . Bipolar I disorder, current or most recent episode manic, with psychotic features ([place]) [F31.2]     Mental Status Per Nursing Assessment::   On Admission:  NA  Demographic Factors:  Male and Caucasian  Loss Factors: NA  Historical Factors: Impulsivity  Risk Reduction Factors:   Sense of responsibility to family, Living with another person, especially a relative, Positive social support, Positive therapeutic relationship and Positive coping skills or problem solving skills  Continued Clinical Symptoms:  None  Cognitive Features That Contribute To Risk:  None    Suicide Risk:  Minimal: No identifiable suicidal ideation.    Follow-up [facility]. Go on 05/20/2018.   Why:  Please go to your hospital follow up appointment on Thursday, 05/20/18 at [place]. Thank you! Contact information: [place] [address] [phone]           Plan Of Care/Follow-up recommendations: Follow up with [place], MD 05/19/2018, 8:57 AM

## 2018-05-19 NOTE — Progress Notes (Signed)
Recreation Therapy Notes  INPATIENT RECREATION TR PLAN  Patient Details Name: Steven Knight MRN: [id number] DOB: 04/24/1971 Today's Date: 05/19/2018  Rec Therapy Plan Is patient appropriate for Therapeutic Recreation?: Yes Treatment times per week: at least 3 Estimated Length of Stay: 5-7 days TR Treatment/Interventions: Group participation (Comment)  Discharge Criteria Pt will be discharged from therapy if:: Discharged Treatment plan/goals/alternatives discussed and agreed upon by:: Patient/family  Discharge Summary Short term goals set: Patient will identify 3 positive coping skills strategies to use post d/c within 5 recreation therapy group sessions Short term goals met: Complete Progress toward goals comments: Groups attended Which groups?: Communication, Stress management, Coping skills, Anger management, Goal setting, Other (Comment)(Happiness, creative expressions) Reason goals not met: N/A Therapeutic equipment acquired: N/A Reason patient discharged from therapy: Discharge from hospital Pt/family agrees with progress & goals achieved: Yes Date patient discharged from therapy: 05/19/18      05/19/2018, 1:05 PM  

## 2018-05-19 NOTE — Progress Notes (Signed)
  [facility] Adult Case Management Discharge Plan :  Will you be returning to the same living situation after discharge:  Yes,  returning home. At discharge, do you have transportation home?: Yes,  pt's wife. Do you have the ability to pay for your medications: Yes,  income.  Release of information consent forms completed and in the chart;  Patient's signature needed at discharge.  Patient to Follow up at: Follow-up [facility]. Go on 05/20/2018.   Why:  Please go to your hospital follow up appointment on Thursday, 05/20/18 at [facility]. Thank you! Contact information: [place] [address] [phone]           Next level of care provider has access to [place] and Suicide Prevention discussed: Yes,  with pt and his wife.  Have you used any form of tobacco in the last 30 days? (Cigarettes, Smokeless Tobacco, Cigars, and/or Pipes): No  Has patient been referred to the Quitline?: N/A patient is not a smoker  Patient has been referred for addiction treatment: N/A  Alden Hipp, LCSW 05/19/2018, 8:18 AM

## 2018-06-28 ENCOUNTER — Emergency Department
Admission: EM | Admit: 2018-06-28 | Discharge: 2018-06-29 | Disposition: A | Discharge status: Other Acute Inpatient Hospital | Payer: BLUE CROSS/BLUE SHIELD | Attending: Emergency Medicine | Admitting: Emergency Medicine

## 2018-06-28 ENCOUNTER — Encounter: Payer: Self-pay | Admitting: Emergency Medicine

## 2018-06-28 DIAGNOSIS — Z733 Stress, not elsewhere classified: Secondary | ICD-10-CM | POA: Insufficient documentation

## 2018-06-28 DIAGNOSIS — F339 Major depressive disorder, recurrent, unspecified: Secondary | ICD-10-CM

## 2018-06-28 DIAGNOSIS — F840 Autistic disorder: Secondary | ICD-10-CM | POA: Insufficient documentation

## 2018-06-28 DIAGNOSIS — G4700 Insomnia, unspecified: Secondary | ICD-10-CM | POA: Insufficient documentation

## 2018-06-28 DIAGNOSIS — Z79899 Other long term (current) drug therapy: Secondary | ICD-10-CM | POA: Insufficient documentation

## 2018-06-28 DIAGNOSIS — Z8659 Personal history of other mental and behavioral disorders: Secondary | ICD-10-CM

## 2018-06-28 DIAGNOSIS — F315 Bipolar disorder, current episode depressed, severe, with psychotic features: Secondary | ICD-10-CM

## 2018-06-28 DIAGNOSIS — F319 Bipolar disorder, unspecified: Secondary | ICD-10-CM | POA: Diagnosis not present

## 2018-06-28 DIAGNOSIS — R45851 Suicidal ideations: Secondary | ICD-10-CM | POA: Diagnosis not present

## 2018-06-28 DIAGNOSIS — F329 Major depressive disorder, single episode, unspecified: Secondary | ICD-10-CM | POA: Diagnosis present

## 2018-06-28 LAB — COMPREHENSIVE METABOLIC PANEL WITH GFR
ALT: 22 U/L (ref 0–44)
AST: 19 U/L (ref 15–41)
Albumin: 4.4 g/dL (ref 3.5–5.0)
Alkaline Phosphatase: 45 U/L (ref 38–126)
Anion gap: 7 (ref 5–15)
BUN: 15 mg/dL (ref 6–20)
CO2: 28 mmol/L (ref 22–32)
Calcium: 9 mg/dL (ref 8.9–10.3)
Chloride: 108 mmol/L (ref 98–111)
Creatinine, Ser: 0.9 mg/dL (ref 0.61–1.24)
GFR calc Af Amer: >60 mL/min
GFR calc non Af Amer: >60 mL/min
Glucose, Bld: 114 mg/dL — ABNORMAL HIGH (ref 70–99)
Potassium: 4.1 mmol/L (ref 3.5–5.1)
Sodium: 143 mmol/L (ref 135–145)
Total Bilirubin: 0.8 mg/dL (ref 0.3–1.2)
Total Protein: 7.4 g/dL (ref 6.5–8.1)

## 2018-06-28 LAB — CBC
HCT: 45.2 % (ref 40.0–52.0)
Hemoglobin: 15.5 g/dL (ref 13.0–18.0)
MCH: 29.8 pg (ref 26.0–34.0)
MCHC: 34.3 g/dL (ref 32.0–36.0)
MCV: 86.8 fL (ref 80.0–100.0)
Platelets: 120 10*3/uL — ABNORMAL LOW (ref 150–440)
RBC: 5.2 MIL/uL (ref 4.40–5.90)
RDW: 13.9 % (ref 11.5–14.5)
WBC: 9.5 10*3/uL (ref 3.8–10.6)

## 2018-06-28 LAB — URINE DRUG SCREEN, QUALITATIVE (ARMC ONLY)
Amphetamines, Ur Screen: NOT DETECTED
Benzodiazepine, Ur Scrn: NOT DETECTED
Cannabinoid 50 Ng, Ur ~~LOC~~: NOT DETECTED
Cocaine Metabolite,Ur ~~LOC~~: NOT DETECTED
MDMA (Ecstasy)Ur Screen: NOT DETECTED
Methadone Scn, Ur: NOT DETECTED
Opiate, Ur Screen: NOT DETECTED
Phencyclidine (PCP) Ur S: NOT DETECTED
Tricyclic, Ur Screen: NOT DETECTED

## 2018-06-28 LAB — ACETAMINOPHEN LEVEL: Acetaminophen (Tylenol), Serum: <10 ug/mL — ABNORMAL LOW (ref 10–30)

## 2018-06-28 LAB — ETHANOL: Alcohol, Ethyl (B): <10 mg/dL

## 2018-06-28 LAB — VALPROIC ACID LEVEL: Valproic Acid Lvl: 68 ug/mL (ref 50.0–100.0)

## 2018-06-28 LAB — SALICYLATE LEVEL: Salicylate Lvl: <7 mg/dL (ref 2.8–30.0)

## 2018-06-28 MED ORDER — ARIPIPRAZOLE 10 MG PO TABS
20.0000 mg | ORAL_TABLET | Freq: Every day | ORAL | Status: DC
  Administered 2018-06-28 – 2018-06-29 (×2): 20 mg via ORAL
  Filled 2018-06-28 (×2): qty 2

## 2018-06-28 MED ORDER — DIVALPROEX SODIUM 250 MG PO DR TAB
250.0000 mg | DELAYED_RELEASE_TABLET | Freq: Every day | ORAL | Status: DC
  Administered 2018-06-29: 250 mg via ORAL
  Filled 2018-06-28: qty 1

## 2018-06-28 MED ORDER — DIVALPROEX SODIUM 500 MG PO DR TAB: 1000.0000 mg | DELAYED_RELEASE_TABLET | Freq: Every day | ORAL | Status: DC

## 2018-06-28 NOTE — ED Notes (Signed)
BEHAVIORAL HEALTH ROUNDING Patient sleeping: No. Patient alert and oriented: yes Behavior appropriate: Yes.  ; If no, describe:  Nutrition and fluids offered: yes Toileting and hygiene offered: Yes  Sitter present: q15 minute observations and security monitoring Law enforcement present: Yes    ENVIRONMENTAL ASSESSMENT Potentially harmful objects out of patient reach: Yes.   Personal belongings secured: Yes.   Patient dressed in hospital provided attire only: Yes.   Plastic bags out of patient reach: Yes.   Patient care equipment (cords, cables, call bells, lines, and drains) shortened, removed, or accounted for: Yes.   Equipment and supplies removed from bottom of stretcher: Yes.   Potentially toxic materials out of patient reach: Yes.   Sharps container removed or out of patient reach: Yes.

## 2018-06-28 NOTE — ED Notes (Signed)
Hourly rounding reveals patient in room. No complaints, stable, in no acute distress. Q15 minute rounds and monitoring via [employer] to continue.

## 2018-06-28 NOTE — ED Notes (Signed)
TTS assessing patient.

## 2018-06-28 NOTE — BH Assessment (Signed)
Assessment Note  Steven Knight is an 47 y.o. male who presents to the ED with c/o feeling depressed and having thoughts of suicide. Pt reports that he doesn't want to kill himself but sometimes thinks that people in his life would be better off without him here. "I can't stop shaking, feels like I'm no good to anybody. I just want to give up on life." He reports that he has a lot of stressors in life including his wife, step-daughters, and work assignments. " It's a lot to handle on a daily basis. I came here to not endanger myself or someone else. I just couldn't drive anymore and I needed to be somewhere safe. I just don't need to be here on earth anymore. I can't contribute at home or at work. I won't be missed if I'm gone."   Pt endorses SI without a plan or intent  Pt denies HI A/V H/D  Diagnosis: Depression  Past Medical History:  Past Medical History:  Diagnosis Date  . Autism   . Autism     History reviewed. No pertinent surgical history.  Family History: No family history on file.  Social History:  reports that he has never smoked. He has never used smokeless tobacco. He reports that he does not drink alcohol or use drugs.  Additional Social History:  Alcohol / Drug Use Pain Medications: SEE MAR Prescriptions: SEE MAR Over the Counter: SEE MAR History of alcohol / drug use?: No history of alcohol / drug abuse  CIWA: CIWA-Ar BP: 131/86 COWS:    Allergies: No Known Allergies  Home Medications:  (Not in a hospital admission)  OB/GYN Status:  No LMP for male patient.  General Assessment Data Location of Assessment: [facility] ED TTS Assessment: In system Is this a Tele or Face-to-Face Assessment?: Face-to-Face Is this an Initial Assessment or a Re-assessment for this encounter?: Initial Assessment Marital status: Married Is patient pregnant?: No Pregnancy Status: No Living Arrangements: Spouse/significant other Can pt return to current living arrangement?:  Yes Admission Status: Voluntary Is patient capable of signing voluntary admission?: Yes Referral Source: Self/Family/Friend Insurance type: Scientific laboratory technician Exam ([place]) Medical Exam completed: Yes  Crisis Care Plan Living Arrangements: Spouse/significant other Legal Guardian: Other:(sELF) Name of Psychiatrist: PCP - [place] Name of Therapist: Cristie Hem ([place])  Education Status Is patient currently in school?: No Is the patient employed, unemployed or receiving disability?: Employed  Risk to self with the past 6 months Suicidal Ideation: No Has patient been a risk to self within the past 6 months prior to admission? : No Suicidal Intent: No Has patient had any suicidal intent within the past 6 months prior to admission? : No Is patient at risk for suicide?: No Suicidal Plan?: No Has patient had any suicidal plan within the past 6 months prior to admission? : Yes Access to Means: Yes Specify Access to Suicidal Means: Car What has been your use of drugs/alcohol within the last 12 months?: denies Previous Attempts/Gestures: No How many times?: 0 Other Self Harm Risks: (none) Triggers for Past Attempts: None known Intentional Self Injurious Behavior: None Family Suicide History: No Recent stressful life event(s): Turmoil (Comment) Persecutory voices/beliefs?: No Depression: Yes Depression Symptoms: Insomnia, Despondent, Feeling worthless/self pity Substance abuse history and/or treatment for substance abuse?: No Suicide prevention information given to non-admitted patients: Not applicable  Risk to Others within the past 6 months Homicidal Ideation: No Does patient have any lifetime risk of violence  toward others beyond the six months prior to admission? : No Thoughts of Harm to Others: No Current Homicidal Intent: No Current Homicidal Plan: No Access to Homicidal Means: No Identified Victim: n/a History of harm to others?:  No Assessment of Violence: None Noted Violent Behavior Description: pt denies Does patient have access to weapons?: No Criminal Charges Pending?: No Does patient have a court date: No Is patient on probation?: No  Psychosis Hallucinations: None noted Delusions: None noted  Mental Status Report Appearance/Hygiene: Unremarkable, In scrubs Eye Contact: Good Motor Activity: Freedom of movement, Restlessness, Unsteady Speech: Logical/coherent, Unremarkable Level of Consciousness: Alert Mood: Depressed Affect: Anxious Anxiety Level: Moderate Thought Processes: Thought Blocking, Relevant Judgement: Partial Orientation: Person, Place, Time, Situation Obsessive Compulsive Thoughts/Behaviors: Minimal  Cognitive Functioning Concentration: Decreased Memory: Recent Intact, Remote Intact Is patient IDD: No Is patient DD?: No Insight: Poor Impulse Control: Fair Appetite: Fair Have you had any weight changes? : Gain Amount of the weight change? (lbs): 15 lbs Sleep: Decreased Total Hours of Sleep: 3(Pt reports sleeping 3-4 hours a night) Vegetative Symptoms: None  ADLScreening [facility] Assessment Services) Patient's cognitive ability adequate to safely complete daily activities?: Yes Patient able to express need for assistance with ADLs?: Yes Independently performs ADLs?: Yes (appropriate for developmental age)  Prior Inpatient Therapy Prior Inpatient Therapy: Yes Prior Therapy Dates: 2019 Prior Therapy Facilty/Provider(s): [facility] Reason for Treatment: Depression  Prior Outpatient Therapy Prior Outpatient Therapy: Yes Prior Therapy Dates: Current Prior Therapy Facilty/Provider(s): [facility] Cristie Hem) Reason for Treatment: Depression Does patient have an ACCT team?: No Does patient have Intensive In-House Services?  : No Does patient have [facility] services? : No Does patient have P4CC services?: No  ADL Screening (condition at time of admission) Patient's cognitive ability adequate to  safely complete daily activities?: Yes Is the patient deaf or have difficulty hearing?: No Does the patient have difficulty seeing, even when wearing glasses/contacts?: No Does the patient have difficulty concentrating, remembering, or making decisions?: No Patient able to express need for assistance with ADLs?: Yes Does the patient have difficulty dressing or bathing?: No Independently performs ADLs?: Yes (appropriate for developmental age) Does the patient have difficulty walking or climbing stairs?: No Weakness of Legs: None Weakness of Arms/Hands: None  Home Assistive Devices/Equipment Home Assistive Devices/Equipment: Eyeglasses  Therapy Consults (therapy consults require a physician order) PT Evaluation Needed: No OT Evalulation Needed: No SLP Evaluation Needed: No Abuse/Neglect Assessment (Assessment to be complete while patient is alone) Physical Abuse: Denies Verbal Abuse: Yes, present (Comment) Sexual Abuse: Denies Exploitation of patient/patient's resources: Denies Self-Neglect: Denies Values / Beliefs Cultural Requests During Hospitalization: None Spiritual Requests During Hospitalization: None Consults Spiritual Care Consult Needed: No Social Work Consult Needed: No Regulatory affairs officer (For Healthcare) Does Patient Have a Medical Advance Directive?: No Would patient like information on creating a medical advance directive?: No - Patient declined    Additional Information 1:1 In Past 12 Months?: No CIRT Risk: No Elopement Risk: No Does patient have medical clearance?: Yes  Child/Adolescent Assessment Running Away Risk: Denies(Pt is an adult)  Disposition:  Disposition Initial Assessment Completed for this Encounter: Yes Disposition of Patient: (Pending Psych eval) Patient refused recommended treatment: No Mode of transportation if patient is discharged?: Car Patient referred to: (Pending recommendation)  On Site Evaluation by:   Reviewed with Physician:     Jaxyn Rout D Davie Sagona 06/28/2018 2:25 PM

## 2018-06-28 NOTE — ED Notes (Signed)
VOL/  SEEN  BY  DR  [facility] MD  PENDING  PLACEMENT

## 2018-06-28 NOTE — ED Notes (Signed)
Patient states he does not want any information released.

## 2018-06-28 NOTE — ED Provider Notes (Signed)
[facility] Emergency Department Provider Note   ____________________________________________   First MD Initiated Contact with Patient 06/28/18 [phone]     (approximate)  I have reviewed the triage vital signs and the nursing notes.   HISTORY  Chief Complaint Suicidal    HPI Steven Knight is a 47 y.o. male patient reports he is feeling depressed recently and not sleeping well.  He is not functioning at work he feels like it is not doing anything useful and not helping anyone.  He is having suicidal thoughts although he cannot articulate a plan to me he says he just wants to be somewhere safe.  This is essentially the same history he gave the triage nurse.   Past Medical History:  Diagnosis Date  . Autism   . Autism     Patient Active Problem List   Diagnosis Date Noted  . Bipolar I disorder, current or most recent episode manic, with psychotic features ([place])     History reviewed. No pertinent surgical history.  Prior to Admission medications   Medication Sig Start Date End Date Taking? Authorizing Provider  ARIPiprazole (ABILIFY) 20 MG tablet Take 1 tablet (20 mg total) by mouth daily. 05/19/18  Yes McNew, Tyson Babinski, MD  divalproex (DEPAKOTE) 250 MG DR tablet Take 1 tablet (250 mg total) by mouth daily with breakfast. 05/19/18  Yes McNew, Tyson Babinski, MD  divalproex (DEPAKOTE) 500 MG DR tablet Take 2 tablets (1,000 mg total) by mouth at bedtime. 05/18/18  Yes McNew, Tyson Babinski, MD  traZODone (DESYREL) 50 MG tablet Take 1 tablet (50 mg total) by mouth at bedtime as needed for sleep. 05/18/18  Yes McNew, Tyson Babinski, MD    Allergies Patient has no known allergies.  No family history on file.  Social History Social History   Tobacco Use  . Smoking status: Never Smoker  . Smokeless tobacco: Never Used  Substance Use Topics  . Alcohol use: Never    Frequency: Never  . Drug use: Never    Review of Systems  Constitutional: No fever/chills Eyes:  No visual changes. ENT: No sore throat. Cardiovascular: Denies chest pain. Respiratory: Denies shortness of breath. Gastrointestinal: No abdominal pain.  No nausea, no vomiting.  No diarrhea.  No constipation. Genitourinary: Negative for dysuria. Musculoskeletal: Negative for back pain. Skin: Negative for rash. Neurological: Negative for headaches, focal weakness  ____________________________________________   PHYSICAL EXAM:  VITAL SIGNS: ED Triage Vitals  Enc Vitals Group     BP 06/28/18 0831 131/86     Pulse --      Resp --      Temp 06/28/18 0831 97.6 F (36.4 C)     Temp Source 06/28/18 0831 Oral     SpO2 06/28/18 0831 97 %     Weight 06/28/18 0832 183 lb (83 kg)     Height 06/28/18 0832 6' 2"  (1.88 m)     Head Circumference --      Peak Flow --      Pain Score 06/28/18 0915 0     Pain Loc --      Pain Edu? --      Excl. in [place]? --     Constitutional: Alert and oriented. Well appearing and in no acute distress. Eyes: Conjunctivae are normal. Head: Atraumatic. Nose: No congestion/rhinnorhea. Mouth/Throat: Mucous membranes are moist.  Oropharynx non-erythematous. Neck: No stridor.   Cardiovascular: Normal rate, regular rhythm. Grossly normal heart sounds.  Good peripheral circulation. Respiratory: Normal respiratory effort.  No retractions. Lungs CTAB. Gastrointestinal: Soft and nontender. No distention. No abdominal bruits. No CVA tenderness. Musculoskeletal: No lower extremity tenderness nor edema.   Neurologic:  Normal speech and language. No gross focal neurologic deficits are appreciated. No gait instability. Skin:  Skin is warm, dry and intact. No rash noted.   ____________________________________________   LABS (all labs ordered are listed, but only abnormal results are displayed)  Labs Reviewed  COMPREHENSIVE METABOLIC PANEL - Abnormal; Notable for the following components:      Result Value   Glucose, Bld 114 (*)    All other components within normal  limits  ACETAMINOPHEN LEVEL - Abnormal; Notable for the following components:   Acetaminophen (Tylenol), Serum <10 (*)    All other components within normal limits  CBC - Abnormal; Notable for the following components:   Platelets 120 (*)    All other components within normal limits  URINE DRUG SCREEN, QUALITATIVE ([facility] ONLY) - Abnormal; Notable for the following components:   Barbiturates, Ur Screen   (*)    Value: Result not available. Reagent lot number recalled by manufacturer.   All other components within normal limits  ETHANOL  SALICYLATE LEVEL   ____________________________________________  EKG   ____________________________________________  RADIOLOGY  ED MD interpretation:    Official radiology report(s): No results found.  ____________________________________________   PROCEDURES  Procedure(s) performed:   Procedures  Critical Care performed:   ____________________________________________   INITIAL IMPRESSION / ASSESSMENT AND PLAN / ED COURSE      ____________________________________________   FINAL CLINICAL IMPRESSION(S) / ED DIAGNOSES  Final diagnoses:  Recurrent major depressive disorder, remission status unspecified [facility])     ED Discharge Orders    None       Note:  This document was prepared using Dragon voice recognition software and may include unintentional dictation errors.    Nena Polio, MD 06/28/18 [phone]

## 2018-06-28 NOTE — Consult Note (Signed)
[facility] Consult   Reason for Consult: Consult for  47 year old man who comes back to the emergency room reporting suicidal ideation Referring Physician: Quentin Cornwall Patient Identification: Steven Knight MRN:  [id number] Principal Diagnosis: Bipolar 1 disorder, depressed ([place]) Diagnosis:   Patient Active Problem List   Diagnosis Date Noted  . Bipolar 1 disorder, depressed ([place]) [F31.9] 06/28/2018  . Suicidal ideation [R45.851] 06/28/2018  . Bipolar I disorder, current or most recent episode manic, with psychotic features ([place]) [F31.2]     Total Time spent with patient: 1 hour  Subjective:   Steven Knight is a 47 y.o. male patient admitted with "I feel like everything is nerve racking".  HPI: Patient seen chart reviewed.  This is a 47 year old man who was recently discharged about a month ago.  He comes back to the emergency room saying that he feels suicidal and feels like he should give up on everything in life.  For the past week he has felt very sad depressed withdrawn.  No energy.  Constant suicidal ideation.  He blames his work for this.  Ever since he went back to work at his job he has felt completely overwhelmed.  Denies that he is having any psychotic symptoms.  Says that he is having a lot of trouble thinking clearly however.  He says he has been taking his medication as prescribed and is not drinking or using drugs.  Patient looks disheveled and very confused.  Social history: Lives with his wife and daughter.  Has been working at his current job for just a few months but finds it completely overwhelming.  Medical history: Other than the mental health problems does not appear to have significant ongoing medical issues  Substance abuse history: Does not drink does not use any drugs.  Past Psychiatric History: Patient was here in the hospital just a month or so ago.  Diagnosed at that time with bipolar disorder manic-like symptoms.  Now seems to  be having an agitated depression.  Patient says he has been compliant with his medicine but we have no Depakote level back yet.  Patient is endorsing suicidal ideation and has had suicidal thoughts intrusively in the past.  In addition to the bipolar diagnosis carries a more long-term diagnosis of autistic spectrum disorder.  Risk to Self: Suicidal Ideation: No Suicidal Intent: No Is patient at risk for suicide?: No Suicidal Plan?: No Access to Means: Yes Specify Access to Suicidal Means: Car What has been your use of drugs/alcohol within the last 12 months?: denies How many times?: 0 Other Self Harm Risks: (none) Triggers for Past Attempts: None known Intentional Self Injurious Behavior: None Risk to Others: Homicidal Ideation: No Thoughts of Harm to Others: No Current Homicidal Intent: No Current Homicidal Plan: No Access to Homicidal Means: No Identified Victim: n/a History of harm to others?: No Assessment of Violence: None Noted Violent Behavior Description: pt denies Does patient have access to weapons?: No Criminal Charges Pending?: No Does patient have a court date: No Prior Inpatient Therapy: Prior Inpatient Therapy: Yes Prior Therapy Dates: 2019 Prior Therapy Facilty[facility]Provider[facility] Reason for Treatment: Depression Prior Outpatient Therapy: Prior Outpatient Therapy: Yes Prior Therapy Dates: Current Prior Therapy Facilty/Provider(s): [facility] Reason for Treatment: Depression Does patient have an ACCT team?: No Does patient have Intensive In-House Services?  : No Does patient have [facility] services? : No Does patient have P4CC services?: No  Past Medical History:  Past Medical History:  Diagnosis Date  . Autism   .  Autism    History reviewed. No pertinent surgical history. Family History: No family history on file. Family Psychiatric  History: Patient says that he had an uncle with depression. Social History:  Social History   Substance and Sexual Activity   Alcohol Use Never  . Frequency: Never     Social History   Substance and Sexual Activity  Drug Use Never    Social History   Socioeconomic History  . Marital status: Married    Spouse name: Not on file  . Number of children: Not on file  . Years of education: Not on file  . Highest education level: Not on file  Occupational History  . Not on file  Social Needs  . Financial resource strain: Not on file  . Food insecurity:    Worry: Not on file    Inability: Not on file  . Transportation needs:    Medical: Not on file    Non-medical: Not on file  Tobacco Use  . Smoking status: Never Smoker  . Smokeless tobacco: Never Used  Substance and Sexual Activity  . Alcohol use: Never    Frequency: Never  . Drug use: Never  . Sexual activity: Yes  Lifestyle  . Physical activity:    Days per week: Not on file    Minutes per session: Not on file  . Stress: Not on file  Relationships  . Social connections:    Talks on phone: Not on file    Gets together: Not on file    Attends religious service: Not on file    Active member of club or organization: Not on file    Attends meetings of clubs or organizations: Not on file    Relationship status: Not on file  Other Topics Concern  . Not on file  Social History Narrative  . Not on file   Additional Social History:    Allergies:  No Known Allergies  Labs:  Results for orders placed or performed during the hospital encounter of 06/28/18 (from the past 48 hour(s))  Comprehensive metabolic panel     Status: Abnormal   Collection Time: 06/28/18  8:47 AM  Result Value Ref Range   Sodium 143 135 - 145 mmol/L   Potassium 4.1 3.5 - 5.1 mmol/L   Chloride 108 98 - 111 mmol/L    Comment: Please note change in reference range.   CO2 28 22 - 32 mmol/L   Glucose, Bld 114 (H) 70 - 99 mg/dL    Comment: Please note change in reference range.   BUN 15 6 - 20 mg/dL    Comment: Please note change in reference range.   Creatinine, Ser  0.90 0.61 - 1.24 mg/dL   Calcium 9.0 8.9 - 10.3 mg/dL   Total Protein 7.4 6.5 - 8.1 g/dL   Albumin 4.4 3.5 - 5.0 g/dL   AST 19 15 - 41 U/L   ALT 22 0 - 44 U/L    Comment: Please note change in reference range.   Alkaline Phosphatase 45 38 - 126 U/L   Total Bilirubin 0.8 0.3 - 1.2 mg/dL   GFR calc non Af Amer >60 >60 mL/min   GFR calc Af Amer >60 >60 mL/min    Comment: (NOTE) The eGFR has been calculated using the CKD EPI equation. This calculation has not been validated in all clinical situations. eGFR's persistently <60 mL/min signify possible Chronic Kidney Disease.    Anion gap 7 5 - 15    Comment:  Performed at [facility], [address] [place], [place] [address]  Ethanol     Status: None   Collection Time: 06/28/18  8:47 AM  Result Value Ref Range   Alcohol, Ethyl (B) <10 <10 mg/dL    Comment: (NOTE) Lowest detectable limit for serum alcohol is 10 mg/dL. For medical purposes only. Performed at [facility], [address] [place], [place] [address]   Salicylate level     Status: None   Collection Time: 06/28/18  8:47 AM  Result Value Ref Range   Salicylate Lvl <9.0 2.8 - 30.0 mg/dL    Comment: Performed at [facility], [address] [place], [place] [address]  Acetaminophen level     Status: Abnormal   Collection Time: 06/28/18  8:47 AM  Result Value Ref Range   Acetaminophen (Tylenol), Serum <10 (L) 10 - 30 ug/mL    Comment: (NOTE) Therapeutic concentrations vary significantly. A range of 10-30 ug/mL  may be an effective concentration for many patients. However, some  are best treated at concentrations outside of this range. Acetaminophen concentrations >150 ug/mL at 4 hours after ingestion  and >50 ug/mL at 12 hours after ingestion are often associated with  toxic reactions. Performed at [facility], [address] [place], [place] [address]   cbc     Status: Abnormal   Collection Time: 06/28/18  8:47 AM  Result  Value Ref Range   WBC 9.5 3.8 - 10.6 K/uL   RBC 5.20 4.40 - 5.90 MIL/uL   Hemoglobin 15.5 13.0 - 18.0 g/dL   HCT 45.2 40.0 - 52.0 %   MCV 86.8 80.0 - 100.0 fL   MCH 29.8 26.0 - 34.0 pg   MCHC 34.3 32.0 - 36.0 g/dL   RDW 13.9 11.5 - 14.5 %   Platelets 120 (L) 150 - 440 K/uL    Comment: Performed at [facility], [address] [place], [place] [address]  Urine Drug Screen, Qualitative     Status: Abnormal   Collection Time: 06/28/18  8:47 AM  Result Value Ref Range   Tricyclic, Ur Screen NONE DETECTED NONE DETECTED   Amphetamines, Ur Screen NONE DETECTED NONE DETECTED   MDMA (Ecstasy)Ur Screen NONE DETECTED NONE DETECTED   Cocaine Metabolite,Ur [place] NONE DETECTED NONE DETECTED   Opiate, Ur Screen NONE DETECTED NONE DETECTED   Phencyclidine (PCP) Ur S NONE DETECTED NONE DETECTED   Cannabinoid 50 Ng, Ur [place] NONE DETECTED NONE DETECTED   Barbiturates, Ur Screen (A) NONE DETECTED    Result not available. Reagent lot number recalled by manufacturer.   Benzodiazepine, Ur Scrn NONE DETECTED NONE DETECTED   Methadone Scn, Ur NONE DETECTED NONE DETECTED    Comment: (NOTE) Tricyclics + metabolites, urine    Cutoff 1000 ng/mL Amphetamines + metabolites, urine  Cutoff 1000 ng/mL MDMA (Ecstasy), urine              Cutoff 500 ng/mL Cocaine Metabolite, urine          Cutoff 300 ng/mL Opiate + metabolites, urine        Cutoff 300 ng/mL Phencyclidine (PCP), urine         Cutoff 25 ng/mL Cannabinoid, urine                 Cutoff 50 ng/mL Barbiturates + metabolites, urine  Cutoff 200 ng/mL Benzodiazepine, urine              Cutoff 200 ng/mL Methadone, urine  Cutoff 300 ng/mL The urine drug screen provides only a preliminary, unconfirmed analytical test result and should not be used for non-medical purposes. Clinical consideration and professional judgment should be applied to any positive drug screen result due to possible interfering substances. A more specific alternate  chemical method must be used in order to obtain a confirmed analytical result. Gas chromatography / mass spectrometry (GC/MS) is the preferred confirmat ory method. Performed at [facility], [address] [place], [place] [address]     No current facility-administered medications for this encounter.    Current Outpatient Medications  Medication Sig Dispense Refill  . ARIPiprazole (ABILIFY) 20 MG tablet Take 1 tablet (20 mg total) by mouth daily. 30 tablet 0  . divalproex (DEPAKOTE) 250 MG DR tablet Take 1 tablet (250 mg total) by mouth daily with breakfast. 30 tablet 0  . divalproex (DEPAKOTE) 500 MG DR tablet Take 2 tablets (1,000 mg total) by mouth at bedtime. 60 tablet 0  . traZODone (DESYREL) 50 MG tablet Take 1 tablet (50 mg total) by mouth at bedtime as needed for sleep. 30 tablet 0    Musculoskeletal: Strength & Muscle Tone: within normal limits Gait & Station: normal Patient leans: N/A  Psychiatric Specialty Exam: Physical Exam  Nursing note and vitals reviewed. Constitutional: He appears well-developed and well-nourished.  HENT:  Head: Normocephalic and atraumatic.  Eyes: Pupils are equal, round, and reactive to light. Conjunctivae are normal.  Neck: Normal range of motion.  Cardiovascular: Regular rhythm and normal heart sounds.  Respiratory: Effort normal. No respiratory distress.  GI: Soft.  Musculoskeletal: Normal range of motion.  Neurological: He is alert.  Skin: Skin is warm and dry.  Psychiatric: His mood appears anxious. His speech is delayed. He is slowed and withdrawn. Thought content is paranoid. Cognition and memory are impaired. He expresses impulsivity. He exhibits a depressed mood. He expresses suicidal ideation.    Review of Systems  Constitutional: Negative.   HENT: Negative.   Eyes: Negative.   Respiratory: Negative.   Cardiovascular: Negative.   Gastrointestinal: Negative.   Musculoskeletal: Negative.   Skin: Negative.    Neurological: Negative.   Psychiatric/Behavioral: Positive for depression and suicidal ideas. Negative for memory loss. The patient is nervous/anxious and has insomnia.     Blood pressure 131/86, temperature 97.6 F (36.4 C), temperature source Oral, height _0  (1.88 m), weight 83 kg (183 lb), SpO2 97 %.Body mass index is 23.5 kg/m.  General Appearance: Disheveled  Eye Contact:  Fair  Speech:  Slow  Volume:  Decreased  Mood:  Anxious and Depressed  Affect:  Congruent and Constricted  Thought Process:  Disorganized  Orientation:  Full (Time, Place, and Person)  Thought Content:  Rumination and Tangential  Suicidal Thoughts:  Yes.  with intent/plan  Homicidal Thoughts:  No  Memory:  Immediate;   Fair Recent;   Fair Remote;   Fair  Judgement:  Fair  Insight:  Fair  Psychomotor Activity:  Decreased  Concentration:  Concentration: Poor  Recall:  Poor  Fund of Knowledge:  Fair  Language:  Fair  Akathisia:  No  Handed:  Right  AIMS (if indicated):     Assets:  Desire for Improvement Housing Physical Health Resilience Social Support  ADL's:  Impaired  Cognition:  Impaired,  Mild  Sleep:        Treatment Plan Summary: Daily contact with patient to assess and evaluate symptoms and progress in treatment, Medication management and Plan 47 year old man with bipolar  disorder now with depressive symptoms active suicidal ideation confusion disorganized thinking.  Patient will be admitted to a psychiatric bed as soon as one is available.  Continue the Depakote and other medicines as prescribed when he was discharged last time.  Depakote level still pending here.  Supportive counseling completed.  Patient agreeable to the plan Case reviewed with ER doctor and TTS  Disposition: Recommend psychiatric Inpatient admission when medically cleared. Supportive therapy provided about ongoing stressors.  Alethia Berthold, MD 06/28/2018 5:04 PM

## 2018-06-28 NOTE — ED Notes (Signed)
Hourly rounding reveals patient sleeping in room. No complaints, stable, in no acute distress. Q15 minute rounds and monitoring via [employer] to continue.

## 2018-06-28 NOTE — ED Notes (Signed)
Report to include Situation, Background, Assessment, and Recommendations received from [facility]. Patient alert and oriented, warm and dry, in no acute distress. Patient denies SI, HI, VH and pain. Patient states he hears "white noise". Patient made aware of Q15 minute rounds and security cameras for their safety. Patient instructed to come to me with needs or concerns.

## 2018-06-28 NOTE — ED Notes (Signed)
Patient requests shower. Provided bathing supplies and showers by self without difficulty.

## 2018-06-28 NOTE — ED Notes (Signed)
Dr. Weber Cooks at bedside assessing patient.

## 2018-06-28 NOTE — ED Notes (Signed)
Referral information for Psychiatric Hospitalization faxed to;   [facility] [phone]),   . [facility] [phone]),  . [facility] [phone] or [phone]),   . [facility] [phone] or [phone])  . [facility] [phone]),   . [facility] [phone]),   . [facility] [phone] or [phone])  . [facility] [phone])

## 2018-06-28 NOTE — ED Notes (Signed)
Black and red T-shirt, white t-shirt, black shoes, white socks, khaki shorts, blue underwear  Cell phone ,White ring,Wallet ,Keys, black ink pen

## 2018-06-28 NOTE — ED Notes (Signed)
Resting with eyes closed, resp unlabored.

## 2018-06-28 NOTE — ED Triage Notes (Signed)
Patient reports feeling depressed recently and having difficulty sleeping.  Patient states, "I'm not functioning well at work and I just feel like I'm a disappointment to everyone."  Patient reports having suicidal thoughts and states, "I just need to be somewhere safe."

## 2018-06-28 NOTE — ED Notes (Signed)
BEHAVIORAL HEALTH ROUNDING Patient sleeping: No. Patient alert and oriented: yes Behavior appropriate: Yes.  ; If no, describe:  Nutrition and fluids offered: Yes  Toileting and hygiene offered: Yes  Sitter present: not applicable Law enforcement present: Yes

## 2018-06-29 ENCOUNTER — Other Ambulatory Visit: Payer: Self-pay

## 2018-06-29 ENCOUNTER — Inpatient Hospital Stay
Admission: AD | Admit: 2018-06-29 | Discharge: 2018-07-11 | DRG: 885 | Disposition: A | Discharge status: Home / Self Care | Payer: BLUE CROSS/BLUE SHIELD | Source: Intra-hospital | Attending: Psychiatry | Admitting: Psychiatry

## 2018-06-29 DIAGNOSIS — F315 Bipolar disorder, current episode depressed, severe, with psychotic features: Secondary | ICD-10-CM | POA: Diagnosis present

## 2018-06-29 DIAGNOSIS — Z8659 Personal history of other mental and behavioral disorders: Secondary | ICD-10-CM

## 2018-06-29 DIAGNOSIS — G4700 Insomnia, unspecified: Secondary | ICD-10-CM | POA: Diagnosis present

## 2018-06-29 DIAGNOSIS — R45851 Suicidal ideations: Secondary | ICD-10-CM | POA: Diagnosis present

## 2018-06-29 DIAGNOSIS — F3164 Bipolar disorder, current episode mixed, severe, with psychotic features: Secondary | ICD-10-CM | POA: Diagnosis present

## 2018-06-29 DIAGNOSIS — F419 Anxiety disorder, unspecified: Secondary | ICD-10-CM | POA: Diagnosis present

## 2018-06-29 DIAGNOSIS — Z79899 Other long term (current) drug therapy: Secondary | ICD-10-CM

## 2018-06-29 DIAGNOSIS — F840 Autistic disorder: Secondary | ICD-10-CM | POA: Diagnosis present

## 2018-06-29 DIAGNOSIS — F339 Major depressive disorder, recurrent, unspecified: Secondary | ICD-10-CM | POA: Diagnosis not present

## 2018-06-29 LAB — VALPROIC ACID LEVEL: Valproic Acid Lvl: 46 ug/mL — ABNORMAL LOW (ref 50.0–100.0)

## 2018-06-29 MED ORDER — MAGNESIUM HYDROXIDE 400 MG/5ML PO SUSP: 30.0000 mL | Freq: Every day | ORAL | Status: DC | PRN

## 2018-06-29 MED ORDER — ARIPIPRAZOLE 10 MG PO TABS
20.0000 mg | ORAL_TABLET | Freq: Every day | ORAL | Status: DC
  Administered 2018-06-30 – 2018-07-11 (×12): 20 mg via ORAL
  Filled 2018-06-29 (×12): qty 2

## 2018-06-29 MED ORDER — DIVALPROEX SODIUM 250 MG PO DR TAB
250.0000 mg | DELAYED_RELEASE_TABLET | Freq: Every day | ORAL | Status: DC
  Administered 2018-06-30 – 2018-07-11 (×12): 250 mg via ORAL
  Filled 2018-06-29 (×12): qty 1

## 2018-06-29 MED ORDER — TRAZODONE HCL 100 MG PO TABS
100.0000 mg | ORAL_TABLET | Freq: Every evening | ORAL | Status: DC | PRN
  Administered 2018-06-29: 100 mg via ORAL
  Filled 2018-06-29: qty 1

## 2018-06-29 MED ORDER — DIVALPROEX SODIUM 500 MG PO DR TAB
1000.0000 mg | DELAYED_RELEASE_TABLET | Freq: Every day | ORAL | Status: DC
  Administered 2018-06-29 – 2018-07-10 (×12): 1000 mg via ORAL
  Filled 2018-06-29 (×12): qty 2

## 2018-06-29 MED ORDER — ALUM & MAG HYDROXIDE-SIMETH 200-200-20 MG/5ML PO SUSP: 30.0000 mL | ORAL | Status: DC | PRN

## 2018-06-29 MED ORDER — ACETAMINOPHEN 325 MG PO TABS: 650.0000 mg | ORAL_TABLET | Freq: Four times a day (QID) | ORAL | Status: DC | PRN

## 2018-06-29 MED ORDER — HYDROXYZINE HCL 50 MG PO TABS: 50.0000 mg | ORAL_TABLET | Freq: Three times a day (TID) | ORAL | Status: DC | PRN

## 2018-06-29 NOTE — Progress Notes (Signed)
Patient sad and depressed but cooperative during admission assessment. Patient denies SI/HI at this time. Patient denies AVH. Patient informed of fall risk status, fall risk assessed "low" at this time. Patient oriented to unit/staff/room. Patient denies any questions/concerns at this time. Patient safe on unit with Q15 minute checks for safety.Skin assessment and body search done,no contraband found.

## 2018-06-29 NOTE — ED Notes (Signed)
Hourly rounding reveals patient in room. No complaints, stable, in no acute distress. Q15 minute rounds and monitoring via [employer] to continue.

## 2018-06-29 NOTE — ED Notes (Signed)
VOL/Pending Placement/ Referrals Faxed

## 2018-06-29 NOTE — ED Notes (Signed)
Hourly rounding reveals patient sleeping in room. No complaints, stable, in no acute distress. Q15 minute rounds and monitoring via [employer] to continue.

## 2018-06-29 NOTE — Tx Team (Signed)
Initial Treatment Plan 06/29/2018 4:21 PM Steven Knight [id number]    PATIENT STRESSORS: Occupational concerns Traumatic event   PATIENT STRENGTHS: Average or above average intelligence Communication skills Supportive family/friends   PATIENT IDENTIFIED PROBLEMS: Depression 06/29/2018  Suicidal ideations 06/29/2018                   DISCHARGE CRITERIA:  Adequate post-discharge living arrangements Medical problems require only outpatient monitoring Motivation to continue treatment in a less acute level of care  PRELIMINARY DISCHARGE PLAN: Attend aftercare/continuing care group Return to previous living arrangement  PATIENT/FAMILY INVOLVEMENT: This treatment plan has been presented to and reviewed with the patient, Steven Knight, and/or family member,.  The patient and family have been given the opportunity to ask questions and make suggestions.  Merlene Morse, RN 06/29/2018, 4:21 PM

## 2018-06-29 NOTE — [facility] Group Notes (Signed)
[facility] Group Notes:  (Nursing/MHT/Case Management/Adjunct)  Date:  06/29/2018  Time:  10:40 PM  Type of Therapy:  Group Therapy  Participation Level:  Active  Participation Quality:  Appropriate  Affect:  Appropriate  Cognitive:  Appropriate  Insight:  Appropriate  Engagement in Group:  Engaged  Modes of Intervention:  Discussion  Summary of Progress/Problems: Steven Knight stated he did not have a goal today because he was admitted this day. Steven Knight stated his day was good. "So far, so good." MHT reviewed rules and expectations of unit. MHT encouraged patients to clean up after themselves. MHT informed patients not to take food back to the rooms. MHT informed patients of visitation and phone hours. MHT informed patients of vitals at 6am and encouraged everyone to get a good night sleep. MHT informed patients that techs would be looking in rooms routinely to perform checks and for everyone to keep covered up throughout the night. MHT covered topic of preventing and managing stress. MHT discussed identifying stressors in patient's life. MHT discussed the importance of relaxing and taking time for self. MHT discussed self-awareness and preparing for the things that cause stress. MHT provided examples of how to deal with the stressors of hearing voices. MHT discussed taking medications as prescribed. Group discussion on strategies to help with hearing voices. Things to try brought up in group were using head phones, using ear buds, cotton swabs, exercising, and talking to doctor if medications were not effective. MHT discussed the signs of stress and informed of burn out.  [name] 06/29/2018, 10:40 PM

## 2018-06-29 NOTE — ED Provider Notes (Signed)
-----------------------------------------   7:05 AM on 06/29/2018 -----------------------------------------   Blood pressure 123/82, pulse 83, temperature 97.6 F (36.4 C), temperature source Oral, height 6' 2"  (1.88 m), weight 83 kg (183 lb), SpO2 98 %.  The patient had no acute events since last update.  Calm and cooperative at this time.  Disposition is pending Psychiatry/Behavioral Medicine team recommendations.     [name], MD 06/29/18 [phone]

## 2018-06-29 NOTE — BH Assessment (Signed)
Patient is to be admitted to [facility] by Dr. Weber Cooks.  Attending Physician will be Dr. Bary Leriche.   Patient has been assigned to room 309, by [place] Nurse [place].   ER staff is aware of the admission:  Lattie Haw, ER Dian Situ   Dr. Archie Balboa, ER MD   Donneta Romberg, Patient's Nurse   Vonna Kotyk, Patient Access.

## 2018-06-30 ENCOUNTER — Encounter: Payer: Self-pay | Admitting: Psychiatry

## 2018-06-30 DIAGNOSIS — F315 Bipolar disorder, current episode depressed, severe, with psychotic features: Secondary | ICD-10-CM

## 2018-06-30 MED ORDER — QUETIAPINE FUMARATE 100 MG PO TABS
100.0000 mg | ORAL_TABLET | Freq: Every day | ORAL | Status: DC
Start: 2018-06-30 — End: 2018-07-01
  Administered 2018-06-30: 100 mg via ORAL
  Filled 2018-06-30: qty 1

## 2018-06-30 MED ORDER — VENLAFAXINE HCL ER 75 MG PO CP24
150.0000 mg | ORAL_CAPSULE | Freq: Every day | ORAL | Status: DC
  Administered 2018-06-30 – 2018-07-01 (×2): 150 mg via ORAL
  Filled 2018-06-30 (×2): qty 2

## 2018-06-30 NOTE — Progress Notes (Signed)
D: Pt denies SI/HI/AVH. Pt. When asked about suicidal thoughts denies all. Pt. Verbally is able to contract for safety. Pt is pleasant and cooperative forwards little though and can be very minimal. Pt. has no Complaints, but does report high depression of, "10/10" and some anxieties unspecified.  Patient interaction appropriate. Pt. Presents depressed and sad. Pt. Reports good insight into why he is here. Pt. Reports strong desire to get better. Pt. Active around the milieu and does go outside and participate in groups. Pt. Facial expression flat often.   A: Q x 15 minute observation checks were completed for safety. Patient was provided with education.  Patient was given medications. Patient  was encourage to attend groups, participate in unit activities and continue with plan of care. Pt. Chart and plans of care reviewed. Pt. Given support and encouragement.   R: Patient is complaint with medication and unit procedures.             Precautionary checks every 15 minutes for safety maintained, room free of safety hazards, patient sustains no injury or falls during this shift. Will endorse care to next shift.

## 2018-06-30 NOTE — Progress Notes (Signed)
Recreation Therapy Notes  Date: 06/30/2018  Time: 9:30 am   Location: Craft Room   Behavioral response: N/A   Intervention Topic: Communication  Discussion/Intervention: Patient did not attend group.   Clinical Observations/Feedback:  Patient did not attend group.   [name] LRT/CTRS        Steven Knight 06/30/2018 11:47 AM

## 2018-06-30 NOTE — [facility] Suicide Risk Assessment (Signed)
[facility] INPATIENT:  Family/Significant Other Suicide Prevention Education  Suicide Prevention Education:  Education Completed; Steven Knight, pt's wife, at [phone] has been identified by the patient as the family member/significant other with whom the patient will be residing, and identified as the person(s) who will aid the patient in the event of a mental health crisis (suicidal ideations/suicide attempt).  With written consent from the patient, the family member/significant other has been provided the following suicide prevention education, prior to the and/or following the discharge of the patient.  The suicide prevention education provided includes the following:  Suicide risk factors  Suicide prevention and interventions  National Suicide Hotline telephone number  [facility] assessment telephone number  [facility] Emergency Assistance [place] and/or Residential Mobile Crisis Unit telephone number  Request made of family/significant other to:  Remove weapons (e.g., guns, rifles, knives), all items previously/currently identified as safety concern.    Remove drugs/medications (over-the-counter, prescriptions, illicit drugs), all items previously/currently identified as a safety concern.  The family member/significant other verbalizes understanding of the suicide prevention education information provided.  The family member/significant other agrees to remove the items of safety concern listed above.  Pt's wife stated, "I'm a little shocked that he came in--he didn't tell us that he was going. He was there in June, had a good experience, and felt better. He was begging to go back to work, and we worked it out so he could go back. They gave him a completely different job--super low stress so he could take it easy. He went from manic state to super depressed. He hates the job, hates life. He threatened his life again this past weekend. I have to make him  go take a shower. He just wants to nod off. He has a psychiatrist appointment on August 2nd, and sees a therapist every other week. The therapist said we'd address his meds when he saw the doctor on the second. On Monday morning, he didn't text me when he got to work like he normally does, so I called his work and his supervisor tells me he didn't show up. We were all out looking for him and found his car at the hospital parking lot. We went into the ED, only to be told he was refusing to see [place]. Until yesterday evening, he finally called me to say he was in the hospital but that was it. I don't know why he didn't go to [facility] instead of going to the hospital--because we had a plan." CSW will continue to coordinate with pt's wife as needed for updates/discharge planning.   Steven Hipp, LCSW 06/30/2018, 1:19 PM

## 2018-06-30 NOTE — Plan of Care (Signed)
Pt. Verbalizes understanding of provided education. Pt. More active in groups and unit activities today. Pt. Slept adequate hours last night. Pt. Is compliant with medications. Pt. Denies SI/HI. Pt. Verbally is able to contract for safety. Pt. Reports he can remain safe while on the unit.    Problem: Education: Goal: Knowledge of  General Education information/materials will improve 06/30/2018 0942 by Reyes Ivan, RN Outcome: Progressing 06/30/2018 0936 by Reyes Ivan, RN Outcome: Progressing 06/30/2018 0929 by Reyes Ivan, RN Outcome: Progressing   Problem: Activity: Goal: Interest or engagement in activities will improve 06/30/2018 0942 by Reyes Ivan, RN Outcome: Progressing 06/30/2018 0936 by Reyes Ivan, RN Outcome: Progressing 06/30/2018 0929 by Reyes Ivan, RN Outcome: Progressing Goal: Sleeping patterns will improve 06/30/2018 0942 by Reyes Ivan, RN Outcome: Progressing 06/30/2018 0936 by Reyes Ivan, RN Outcome: Progressing 06/30/2018 0929 by Reyes Ivan, RN Outcome: Progressing   Problem: Health Behavior/Discharge Planning: Goal: Compliance with treatment plan for underlying cause of condition will improve 06/30/2018 0942 by Reyes Ivan, RN Outcome: Progressing 06/30/2018 0936 by Reyes Ivan, RN Outcome: Progressing 06/30/2018 0929 by Reyes Ivan, RN Outcome: Progressing   Problem: Safety: Goal: Ability to disclose and discuss suicidal ideas will improve 06/30/2018 0942 by Reyes Ivan, RN Outcome: Progressing 06/30/2018 0936 by Reyes Ivan, RN Outcome: Progressing 06/30/2018 0929 by Reyes Ivan, RN Outcome: Progressing   Problem: Education: Goal: Knowledge of General Education information will improve Description Including pain rating scale, medication(s)/side effects and non-pharmacologic comfort measures 06/30/2018 0942 by Reyes Ivan, RN Outcome: Progressing 06/30/2018 0929 by  Reyes Ivan, RN Outcome: Progressing

## 2018-06-30 NOTE — H&P (Signed)
Psychiatric Admission Assessment Adult  Patient Identification: Steven Knight MRN:  [id number] Date of Evaluation:  06/30/2018 Chief Complaint:  bipolar disorder Principal Diagnosis: Bipolar I disorder, current or most recent episode depressed, with psychotic features [facility]) Diagnosis:   Patient Active Problem List   Diagnosis Date Noted  . Bipolar I disorder, current or most recent episode depressed, with psychotic features ([place]) [F31.5] 06/28/2018    Priority: High  . Bipolar I disorder, current or most recent episode manic, with psychotic features ([place]) [F31.2]     Priority: High  . Suicidal ideation [R45.851] 06/28/2018   History of Present Illness:   Identifying data. Mr. Brawley is a 47-year-ols male with a history of bipolar disorder.   Chief complaint. "I wanted to drive off the cliff."  History of present illness. Information was obtained from the patient and the chart. The patient came to the hospital complaining of worsening depression and suicidal ideation of 2 days duration. He was hospitalized here in June for a manic episode. He reports good medication compliance but complains that he has not recover fully from last episode. He yried to return to work but was unable to perform. Reports problems with attention, memory and even understanding simple instructions. He reports all sym,ptoms of depression with poor sleep, decreased appetite, anhedonia, feeling of guilt hopelessness worthlessness, poor memory and concentration, crying spells, social isolation and now suicidal ideation with a plan. He continues with his "child-like behaviors" and belives tha he family hates him for that. He is quite paranoid. Denies hallucinations. Denies anxiety. Denies substance abuse.  Past psychiatric history. Long history of problems. Discharge from her on a combination of Depakote and Abilify 20 mg daily, and has been taking them all along. Of note, he is unable to name his medications. He  was referred to [facility] but ended up with [facility] intake. His MD appointment is next week.  Family psychiatric history. None.  Social history. Lives with his wife. Still employed at [employer]. Has two grown children.   Total Time spent with patient: 1 hour  Is the patient at risk to self? Yes.    Has the patient been a risk to self in the past 6 months? No.  Has the patient been a risk to self within the distant past? No.  Is the patient a risk to others? No.  Has the patient been a risk to others in the past 6 months? No.  Has the patient been a risk to others within the distant past? No.   Prior Inpatient Therapy:   Prior Outpatient Therapy:    Alcohol Screening: 1. How often do you have a drink containing alcohol?: Never 2. How many drinks containing alcohol do you have on a typical day when you are drinking?: 1 or 2 3. How often do you have six or more drinks on one occasion?: Never AUDIT-C Score: 0 4. How often during the last year have you found that you were not able to stop drinking once you had started?: Never 5. How often during the last year have you failed to do what was normally expected from you becasue of drinking?: Never 6. How often during the last year have you needed a first drink in the morning to get yourself going after a heavy drinking session?: Never 7. How often during the last year have you had a feeling of guilt of remorse after drinking?: Never 8. How often during the last year have you been unable to remember what happened  the night before because you had been drinking?: Never 9. Have you or someone else been injured as a result of your drinking?: No 10. Has a relative or friend or a doctor or another health worker been concerned about your drinking or suggested you cut down?: No Alcohol Use Disorder Identification Test Final Score (AUDIT): 0 Intervention/Follow-up: AUDIT Score <7 follow-up not indicated Substance Abuse History in the last 12 months:   No. Consequences of Substance Abuse: NA Previous Psychotropic Medications: Yes  Psychological Evaluations: No  Past Medical History:  Past Medical History:  Diagnosis Date  . Autism   . Autism    History reviewed. No pertinent surgical history. Family History: History reviewed. No pertinent family history.  Tobacco Screening: Have you used any form of tobacco in the last 30 days? (Cigarettes, Smokeless Tobacco, Cigars, and/or Pipes): No Social History:  Social History   Substance and Sexual Activity  Alcohol Use Never  . Frequency: Never     Social History   Substance and Sexual Activity  Drug Use Never    Additional Social History: Marital status: Married Number of Years Married: 10 What types of issues is patient dealing with in the relationship?: None reported.  Additional relationship information: Pt was married previously and divorced in 2006.  Are you sexually active?: No What is your sexual orientation?: heterosexual  Has your sexual activity been affected by drugs, alcohol, medication, or emotional stress?: Pt denies.  Does patient have children?: Yes How many children?: 2 How is patient's relationship with their children?: Pt reports having two adult children, ages 69 and 64. Pt reports having a good relationship with both.                          Allergies:  No Known Allergies Lab Results:  Results for orders placed or performed during the hospital encounter of 06/29/18 (from the past 48 hour(s))  Valproic acid level     Status: Abnormal   Collection Time: 06/29/18  3:18 PM  Result Value Ref Range   Valproic Acid Lvl 46 (L) 50.0 - 100.0 ug/mL    Comment: Performed at [facility], [address] [place], [place] [address]    Blood Alcohol level:  Lab Results  Component Value Date   [facility] <10 06/28/2018   ETH <10 07/21/4817    Metabolic Disorder Labs:  Lab Results  Component Value Date   HGBA1C 5.5 05/09/2018   MPG 111.15  05/09/2018   No results found for: PROLACTIN Lab Results  Component Value Date   CHOL 145 05/09/2018   TRIG 104 05/09/2018   HDL 35 (L) 05/09/2018   CHOLHDL 4.1 05/09/2018   VLDL 21 05/09/2018   LDLCALC 89 05/09/2018    Current Medications: Current Facility-Administered Medications  Medication Dose Route Frequency Provider Last Rate Last Dose  . acetaminophen (TYLENOL) tablet 650 mg  650 mg Oral Q6H PRN Clapacs, John T, MD      . alum & mag hydroxide-simeth (MAALOX/MYLANTA) 200-200-20 MG/5ML suspension 30 mL  30 mL Oral Q4H PRN Clapacs, John T, MD      . ARIPiprazole (ABILIFY) tablet 20 mg  20 mg Oral Daily Clapacs, Madie Reno, MD   20 mg at 06/30/18 0755  . divalproex (DEPAKOTE) DR tablet 1,000 mg  1,000 mg Oral QHS Clapacs, Madie Reno, MD   1,000 mg at 06/29/18 2058  . divalproex (DEPAKOTE) DR tablet 250 mg  250 mg Oral Daily Clapacs, John  T, MD   250 mg at 06/30/18 0755  . hydrOXYzine (ATARAX/VISTARIL) tablet 50 mg  50 mg Oral TID PRN Clapacs, John T, MD      . magnesium hydroxide (MILK OF MAGNESIA) suspension 30 mL  30 mL Oral Daily PRN Clapacs, John T, MD      . QUEtiapine (SEROQUEL) tablet 100 mg  100 mg Oral QHS Salahuddin Arismendez B, MD      . venlafaxine XR (EFFEXOR-XR) 24 hr capsule 150 mg  150 mg Oral Q breakfast Aadi Bordner B, MD       PTA Medications: Medications Prior to Admission  Medication Sig Dispense Refill Last Dose  . ARIPiprazole (ABILIFY) 20 MG tablet Take 1 tablet (20 mg total) by mouth daily. 30 tablet 0 06/28/2018 at 0700  . divalproex (DEPAKOTE) 250 MG DR tablet Take 1 tablet (250 mg total) by mouth daily with breakfast. 30 tablet 0 06/28/2018 at 0700  . divalproex (DEPAKOTE) 500 MG DR tablet Take 2 tablets (1,000 mg total) by mouth at bedtime. 60 tablet 0 06/27/2018 at 2100  . traZODone (DESYREL) 50 MG tablet Take 1 tablet (50 mg total) by mouth at bedtime as needed for sleep. 30 tablet 0 06/27/2018 at 2100    Musculoskeletal: Strength & Muscle Tone: within  normal limits Gait & Station: normal Patient leans: N/A  Psychiatric Specialty Exam: Physical Exam  Nursing note and vitals reviewed. Constitutional: He is oriented to person, place, and time. He appears well-developed and well-nourished.  HENT:  Head: Normocephalic and atraumatic.  Eyes: Pupils are equal, round, and reactive to light. Conjunctivae and EOM are normal.  Neck: Normal range of motion. Neck supple.  Cardiovascular: Normal rate, regular rhythm and normal heart sounds.  Respiratory: Effort normal and breath sounds normal.  GI: Soft. Bowel sounds are normal.  Musculoskeletal: Normal range of motion.  Neurological: He is alert and oriented to person, place, and time.  Skin: Skin is warm and dry.  Psychiatric: His mood appears anxious. His affect is blunt. His speech is delayed. He is slowed and withdrawn. Thought content is paranoid and delusional. Cognition and memory are impaired. He expresses impulsivity. He exhibits a depressed mood. He expresses suicidal ideation. He expresses suicidal plans.    Review of Systems  Neurological: Negative.   Psychiatric/Behavioral: Positive for depression, memory loss and suicidal ideas. The patient is nervous/anxious and has insomnia.   All other systems reviewed and are negative.   Blood pressure 105/79, pulse (!) 102, temperature (!) 97.2 F (36.2 C), temperature source Oral, resp. rate 16, height 6' 2"  (1.88 m), weight 77.6 kg (171 lb), SpO2 98 %.Body mass index is 21.96 kg/m.  See SRA                                                  Sleep:  Number of Hours: 7.75    Treatment Plan Summary: Daily contact with patient to assess and evaluate symptoms and progress in treatment and Medication management   Mr. Rape is a 47 year old male with a history of bipolar disorder, hospitalized here no long ago for a manic episode, returns to the hospital depressed and suicidal.  #Suicidal ideation -patient able to  contract to safety in the hospital  #Mood -continue Abilify 20 mg daily -continue depakote 250 mg in AM, 1000 mg in HS -Effexor 150 mg daily  #  Insomnia -start Seroquel 100 mg nightly  #Labs completed recently  #Disposition -discharge with family -follow up with [facility]   Observation Level/Precautions:  15 minute checks  Laboratory:  CBC Chemistry Profile UDS UA  Psychotherapy:    Medications:    Consultations:    Discharge Concerns:    Estimated LOS:  Other:     Physician Treatment Plan for Primary Diagnosis: Bipolar I disorder, current or most recent episode depressed, with psychotic features ([place]) Long Term Goal(s): Improvement in symptoms so as ready for discharge  Short Term Goals: Ability to identify changes in lifestyle to reduce recurrence of condition will improve, Ability to verbalize feelings will improve, Ability to disclose and discuss suicidal ideas, Ability to demonstrate self-control will improve, Ability to identify and develop effective coping behaviors will improve, Ability to maintain clinical measurements within normal limits will improve and Ability to identify triggers associated with substance abuse/mental health issues will improve  Physician Treatment Plan for Secondary Diagnosis: Principal Problem:   Bipolar I disorder, current or most recent episode depressed, with psychotic features ([place]) Active Problems:   Suicidal ideation  Long Term Goal(s): NA  Short Term Goals: NA  I certify that inpatient services furnished can reasonably be expected to improve the patient's condition.    Orson Slick, MD 7/24/20192:17 PM

## 2018-06-30 NOTE — [facility] Group Notes (Signed)
[facility] Group Notes:  (Nursing/MHT/Case Management/Adjunct)  Date:  06/30/2018  Time:  6:26 PM  Type of Therapy:  Psychoeducational Skills  Participation Level:  Active  Participation Quality:  Appropriate  Affect:  Appropriate  Cognitive:  Appropriate  Insight:  Appropriate  Engagement in Group:  Engaged  Modes of Intervention:  Socialization  Summary of Progress/Problems:  Steven Knight 06/30/2018, 6:26 PM

## 2018-06-30 NOTE — [facility] Counselor (Signed)
Adult Comprehensive Assessment  Patient ID: Steven Knight, male   DOB: Nov 23, 1971, 47 y.o.   MRN: [id number]  Information Source: Information source: Patient  Current Stressors:  Patient states their primary concerns and needs for treatment are:: "To feel better."  Patient states their goals for this hospitilization and ongoing recovery are:: "To get better."  Educational / Learning stressors: No issues reported.  Employment / Job issues: Pt reports, "I'm not able to function at work. I can't complete anything or focus."  Family Relationships: No issues reported.  Financial / Lack of resources (include bankruptcy): No issues reported.  Housing / Lack of housing: No issues reported.  Physical health (include injuries & life threatening diseases): No issues reported.  Social relationships: No issues reported.  Substance abuse: None reported.  Bereavement / Loss: No issues reported.   Living/Environment/Situation:  Living Arrangements: Spouse/significant other Living conditions (as described by patient or guardian): "[place]."  Who else lives in the home?: Pt reports living with his wife and being able to return.  How long has patient lived in current situation?: 10 years What is atmosphere in current home: Comfortable, Quarry manager, Supportive  Family History:  Marital status: Married Number of Years Married: 103 What types of issues is patient dealing with in the relationship?: None reported.  Additional relationship information: Pt was married previously and divorced in 2006.  Are you sexually active?: No What is your sexual orientation?: heterosexual  Has your sexual activity been affected by drugs, alcohol, medication, or emotional stress?: Pt denies.  Does patient have children?: Yes How many children?: 2 How is patient's relationship with their children?: Pt reports having two adult children, ages 57 and 17. Pt reports having a good relationship with both.   Childhood  History:  By whom was/is the patient raised?: Both parents Additional childhood history information: "I had a great childhood."  Description of patient's relationship with caregiver when they were a child: "Great."  Patient's description of current relationship with people who raised him/her: "Good."  How were you disciplined when you got in trouble as a child/adolescent?: "Spanked, grounded."  Does patient have siblings?: Yes Number of Siblings: 4 Description of patient's current relationship with siblings: "I have step brothers and sisters. We have a good relationship."  Did patient suffer any verbal/emotional/physical/sexual abuse as a child?: No Did patient suffer from severe childhood neglect?: No Has patient ever been sexually abused/assaulted/raped as an adolescent or adult?: No Witnessed domestic violence?: No Has patient been effected by domestic violence as an adult?: No  Education:  Highest grade of school patient has completed: Associates Degree Currently a student?: No Learning disability?: No  Employment/Work Situation:   Employment situation: Leave of absence Where is patient currently employed?: [employer] How long has patient been employed?: Since February 2019 Patient's job has been impacted by current illness: Yes Describe how patient's job has been impacted: Pt is unable to work, currently on short term disasbility  What is the longest time patient has a held a job?: 12 years Where was the patient employed at that time?: [employer] Did You Receive Any Psychiatric Treatment/Services While in the [employer]?: (N/A) Are There Guns or Other Weapons in [place]?: Yes Types of Guns/Weapons: wife owns a gun in a safe box Are These Weapons Safely Secured?: Yes  Financial Resources:   Financial resources: Income from employment, Income from spouse Does patient have a representative payee or guardian?: No  Alcohol/Substance Abuse:   What has been your use of  drugs/alcohol within the last 12 months?: Pt denies.  If attempted suicide, did drugs/alcohol play a role in this?: No Alcohol/Substance Abuse Treatment Hx: Denies past history Has alcohol/substance abuse ever caused legal problems?: No  Social Support System:   Patient's Community Support System: Good Describe Community Support System: Pt reports his wife, children, and parents are supportive.  Type of faith/religion: Darrick Meigs  How does patient's faith help to cope with current illness?: Prayer  Leisure/Recreation:   Leisure and Hobbies: Pt was unable to provide this information.   Strengths/Needs:   What is the patient's perception of their strengths?: Pt was unable to provide this information.  Patient states they can use these personal strengths during their treatment to contribute to their recovery: Pt was unable to provide this information.  Patient states these barriers may affect/interfere with their treatment: Pt was unable to provide this information.  Patient states these barriers may affect their return to the community: Pt was unable to provide this information.  Other important information patient would like considered in planning for their treatment: N/A  Discharge Plan:   Currently receiving community mental health services: Yes (From Whom)([facility]) Patient states concerns and preferences for aftercare planning are: N/A Patient states they will know when they are safe and ready for discharge when: "I feel better."  Does patient have access to transportation?: Yes Does patient have financial barriers related to discharge medications?: No Patient description of barriers related to discharge medications: N/A Will patient be returning to same living situation after discharge?: Yes  Summary/Recommendations:   Summary and Recommendations (to be completed by the evaluator): Pt is a 47 year old male who presents to [facility] on a voluntary commitment. Pt reports, "I just feel like I  can't contribute anywhere anymore--not at work or at home. I'm a danger to myself and I just don't want to be here anymore." Pt reports +SI, increased depression for the past two weeks. Pt denies HI or AVH. Pt reports he has been adherent with medications since discharge from [facility] 05/2018. Pt denies alcohol or substance use. Pt reports poor appetite and poor sleep. Pt currently lives with his wife and is able to return upon discharge. Pt presented with a flat, depressed affect during assessment. Pt's thoughts were organized and linear. Current recommendations for this patient include: crisis stabilization, therapeutic milieu, encouragement to attend and participate in group therapy, and the development of a comprehensive mental wellness plan.   Alden Hipp, LCSW. 06/30/2018

## 2018-06-30 NOTE — Plan of Care (Addendum)
Patient found in common area upon my arrival. Patient is visible intermittently throughout the evening, is not social. Patient is calm and cooperative. Complains of stress and depression, not being able to "function" in his life. Denies SI/HI/AVH. Patient is isolative from peers but did attend group. Reports eating and voiding adequately. Denies pain. Compliant with HS medications and staff direction. Q 15 minute checks maintained. Will continue to monitor throughout the shift. Patient slept 7.75 hours. No apparent distress. Will endorse care to oncoming shift.  Problem: Health Behavior/Discharge Planning: Goal: Compliance with treatment plan for underlying cause of condition will improve Outcome: Progressing   Problem: Coping: Goal: Coping ability will improve Outcome: Progressing Goal: Will verbalize feelings Outcome: Progressing   Problem: Education: Goal: Emotional status will improve Outcome: Not Progressing Goal: Mental status will improve Outcome: Not Progressing   Problem: Activity: Goal: Interest or engagement in activities will improve Outcome: Not Progressing

## 2018-06-30 NOTE — BHH Suicide Risk Assessment (Signed)
[facility] Admission Suicide Risk Assessment   Nursing information obtained from:  Patient Demographic factors:  Male, Caucasian Current Mental Status:  Self-harm thoughts Loss Factors:  NA Historical Factors:  NA Risk Reduction Factors:  Employed, Living with another person, especially a relative  Total Time spent with patient: 1 hour Principal Problem: Bipolar I disorder, current or most recent episode depressed, with psychotic features ([place]) Diagnosis:   Patient Active Problem List   Diagnosis Date Noted  . Bipolar I disorder, current or most recent episode depressed, with psychotic features ([place]) [F31.5] 06/28/2018    Priority: High  . Bipolar I disorder, current or most recent episode manic, with psychotic features ([place]) [F31.2]     Priority: High  . Suicidal ideation [R45.851] 06/28/2018   Subjective Data: suicidal ideation with a plan to drive off the cliff  Continued Clinical Symptoms:  Alcohol Use Disorder Identification Test Final Score (AUDIT): 0 The "Alcohol Use Disorders Identification Test", Guidelines for Use in Primary Care, Second Edition.  World Pharmacologist [facility]). Score between 0-7:  no or low risk or alcohol related problems. Score between 8-15:  moderate risk of alcohol related problems. Score between 16-19:  high risk of alcohol related problems. Score 20 or above:  warrants further diagnostic evaluation for alcohol dependence and treatment.   CLINICAL FACTORS:   Bipolar Disorder:   Depressive phase Depression:   Insomnia Previous Psychiatric Diagnoses and Treatments   Musculoskeletal: Strength & Muscle Tone: within normal limits Gait & Station: normal Patient leans: Right  Psychiatric Specialty Exam: Physical Exam  Nursing note and vitals reviewed. Psychiatric: His affect is blunt. His speech is delayed. He is slowed and withdrawn. Thought content is paranoid. Cognition and memory are impaired. He expresses impulsivity. He exhibits a depressed  mood.    Review of Systems  Neurological: Negative.   Psychiatric/Behavioral: Positive for depression, memory loss and suicidal ideas. The patient has insomnia.   All other systems reviewed and are negative.   Blood pressure 119/82, pulse 80, temperature (!) 97.4 F (36.3 C), temperature source Oral, resp. rate 16, height 6' 2"  (1.88 m), weight 77.6 kg (171 lb), SpO2 99 %.Body mass index is 21.96 kg/m.  General Appearance: Casual  Eye Contact:  Good  Speech:  Blocked and Slow  Volume:  Decreased  Mood:  Anxious and Depressed  Affect:  Blunt  Thought Process:  Disorganized and Descriptions of Associations: Intact  Orientation:  Full (Time, Place, and Person)  Thought Content:  Delusions and Paranoid Ideation  Suicidal Thoughts:  Yes.  with intent/plan  Homicidal Thoughts:  No  Memory:  Immediate;   Poor Recent;   Poor Remote;   Poor  Judgement:  Poor  Insight:  Lacking  Psychomotor Activity:  Psychomotor Retardation  Concentration:  Concentration: Poor and Attention Span: Poor  Recall:  Poor  Fund of Knowledge:  Poor  Language:  Poor  Akathisia:  No  Handed:  Right  AIMS (if indicated):     Assets:  Communication Skills Desire for Improvement Financial Resources/Insurance Housing Physical Health Resilience Social Support  ADL's:  Intact  Cognition:  WNL  Sleep:         COGNITIVE FEATURES THAT CONTRIBUTE TO RISK:  None    SUICIDE RISK:   Minimal: No identifiable suicidal ideation.  Patients presenting with no risk factors but with morbid ruminations; may be classified as minimal risk based on the severity of the depressive symptoms  PLAN OF CARE: hospital admission, medication management, discharge planning.  Mr.  Rhines is a 47 year old male with a history of bipolar disorder, hospitalized here no long ago for a manic episode, returns to the hospital depressed and suicidal.  #Suicidal ideation -patient able to contract to safety in the  hospital  #Mood -continue Abilify 20 mg daily -continue depakote 250 mg in AM, 1000 mg in HS -Effexor 150 mg daily  #Insomnia -start Seroquel 100 mg nightly  #Labs completed recently  #Disposition -discharge with family -follow up with [facility]  I certify that inpatient services furnished can reasonably be expected to improve the patient's condition.   Orson Slick, MD 06/30/2018, 12:20 AM

## 2018-06-30 NOTE — BHH Group Notes (Signed)
LCSW Group Therapy Note  06/30/2018 1:00 pm  Type of Therapy/Topic:  Group Therapy:  Emotion Regulation  Participation Level:  Did Not Attend   Description of Group:    The purpose of this group is to assist patients in learning to regulate negative emotions and experience positive emotions. Patients will be guided to discuss ways in which they have been vulnerable to their negative emotions. These vulnerabilities will be juxtaposed with experiences of positive emotions or situations, and patients will be challenged to use positive emotions to combat negative ones. Special emphasis will be placed on coping with negative emotions in conflict situations, and patients will process healthy conflict resolution skills.  Therapeutic Goals: 1. Patient will identify two positive emotions or experiences to reflect on in order to balance out negative emotions 2. Patient will label two or more emotions that they find the most difficult to experience 3. Patient will demonstrate positive conflict resolution skills through discussion and/or role plays  Summary of Patient Progress:  Christophr was invited to today's group, but chose not to attend.     Therapeutic Modalities:   Cognitive Behavioral Therapy Feelings Identification Dialectical Behavioral Therapy

## 2018-06-30 NOTE — Progress Notes (Signed)
[facility] MD Progress Note  07/01/2018 10:26 AM Steven Knight  MRN:  [id number]  Subjective:    Steven Knight reports no change. He is still depressed, paranoid, confused, fearful and suicidal. He is able to contract for safety. He accepts medications and tolerates them well. He did not sleep with Seroquel last night, tossing and turning. Appetite is very poor. He isolates to his room. No program participation.   Spoke with the wife who claryfies that the patient did get better following last hospitalization and was able to return to work. His difficulties at work accompnied depressive episode. She points out thet the patient drove himself to the hospital without informing his family. He refused contact with them since admission.  Principal Problem: Bipolar I disorder, current or most recent episode depressed, with psychotic features [facility]) Diagnosis:   Patient Active Problem List   Diagnosis Date Noted  . Bipolar I disorder, current or most recent episode depressed, with psychotic features ([place]) [F31.5] 06/28/2018    Priority: High  . Bipolar I disorder, current or most recent episode manic, with psychotic features ([place]) [F31.2]     Priority: High  . Suicidal ideation [R45.851] 06/28/2018   Total Time spent with patient: 20 minutes  Past Psychiatric History: bipolar illness  Past Medical History:  Past Medical History:  Diagnosis Date  . Autism   . Autism    History reviewed. No pertinent surgical history. Family History: History reviewed. No pertinent family history. Family Psychiatric  History: none Social History:  Social History   Substance and Sexual Activity  Alcohol Use Never  . Frequency: Never     Social History   Substance and Sexual Activity  Drug Use Never    Social History   Socioeconomic History  . Marital status: Married    Spouse name: Not on file  . Number of children: Not on file  . Years of education: Not on file  . Highest education level: Not  on file  Occupational History  . Not on file  Social Needs  . Financial resource strain: Not on file  . Food insecurity:    Worry: Not on file    Inability: Not on file  . Transportation needs:    Medical: Not on file    Non-medical: Not on file  Tobacco Use  . Smoking status: Never Smoker  . Smokeless tobacco: Never Used  Substance and Sexual Activity  . Alcohol use: Never    Frequency: Never  . Drug use: Never  . Sexual activity: Yes  Lifestyle  . Physical activity:    Days per week: Not on file    Minutes per session: Not on file  . Stress: Not on file  Relationships  . Social connections:    Talks on phone: Not on file    Gets together: Not on file    Attends religious service: Not on file    Active member of club or organization: Not on file    Attends meetings of clubs or organizations: Not on file    Relationship status: Not on file  Other Topics Concern  . Not on file  Social History Narrative  . Not on file   Additional Social History:                         Sleep: Poor  Appetite:  Poor  Current Medications: Current Facility-Administered Medications  Medication Dose Route Frequency Provider Last Rate Last Dose  . acetaminophen (  TYLENOL) tablet 650 mg  650 mg Oral Q6H PRN Clapacs, John T, MD      . alum & mag hydroxide-simeth (MAALOX/MYLANTA) 200-200-20 MG/5ML suspension 30 mL  30 mL Oral Q4H PRN Clapacs, John T, MD      . ARIPiprazole (ABILIFY) tablet 20 mg  20 mg Oral Daily Clapacs, Madie Reno, MD   20 mg at 07/01/18 0920  . divalproex (DEPAKOTE) DR tablet 1,000 mg  1,000 mg Oral QHS Clapacs, John T, MD   1,000 mg at 06/30/18 2129  . divalproex (DEPAKOTE) DR tablet 250 mg  250 mg Oral Daily Clapacs, Madie Reno, MD   250 mg at 07/01/18 0920  . feeding supplement (ENSURE ENLIVE) (ENSURE ENLIVE) liquid 237 mL  237 mL Oral TID BM Corita Allinson B, MD      . hydrOXYzine (ATARAX/VISTARIL) tablet 50 mg  50 mg Oral TID PRN Clapacs, John T, MD      .  magnesium hydroxide (MILK OF MAGNESIA) suspension 30 mL  30 mL Oral Daily PRN Clapacs, John T, MD      . temazepam (RESTORIL) capsule 15 mg  15 mg Oral QHS Norrine Ballester B, MD      . venlafaxine XR (EFFEXOR-XR) 24 hr capsule 150 mg  150 mg Oral Q breakfast Arliene Rosenow B, MD   150 mg at 07/01/18 8338    Lab Results:  Results for orders placed or performed during the hospital encounter of 06/29/18 (from the past 48 hour(s))  Valproic acid level     Status: Abnormal   Collection Time: 06/29/18  3:18 PM  Result Value Ref Range   Valproic Acid Lvl 46 (L) 50.0 - 100.0 ug/mL    Comment: Performed at [facility], [address] [place], [place] [address]    Blood Alcohol level:  Lab Results  Component Value Date   [facility] <10 06/28/2018   ETH <10 97/67/3419    Metabolic Disorder Labs: Lab Results  Component Value Date   HGBA1C 5.5 05/09/2018   MPG 111.15 05/09/2018   No results found for: PROLACTIN Lab Results  Component Value Date   CHOL 145 05/09/2018   TRIG 104 05/09/2018   HDL 35 (L) 05/09/2018   CHOLHDL 4.1 05/09/2018   VLDL 21 05/09/2018   LDLCALC 89 05/09/2018    Physical Findings: AIMS:  , ,  ,  ,    CIWA:    COWS:     Musculoskeletal: Strength & Muscle Tone: within normal limits Gait & Station: normal Patient leans: N/A  Psychiatric Specialty Exam: Physical Exam  Nursing note and vitals reviewed. Psychiatric: His affect is blunt. His speech is delayed. He is slowed and withdrawn. Thought content is paranoid. Cognition and memory are impaired. He expresses impulsivity. He exhibits a depressed mood. He expresses suicidal ideation. He expresses suicidal plans.    Review of Systems  Neurological: Negative.   Psychiatric/Behavioral: Positive for depression and suicidal ideas. The patient has insomnia.   All other systems reviewed and are negative.   Blood pressure 110/84, pulse (!) 112, temperature 98.5 F (36.9 C), temperature source Oral,  resp. rate 16, height 6' 2"  (1.88 m), weight 77.6 kg (171 lb), SpO2 99 %.Body mass index is 21.96 kg/m.  General Appearance: Casual  Eye Contact:  Good  Speech:  Clear and Coherent  Volume:  Decreased  Mood:  Depressed  Affect:  Flat  Thought Process:  Goal Directed and Descriptions of Associations: Intact  Orientation:  Full (Time, Place, and  Person)  Thought Content:  Delusions and Paranoid Ideation  Suicidal Thoughts:  Yes.  with intent/plan  Homicidal Thoughts:  No  Memory:  Immediate;   Poor Recent;   Poor Remote;   Poor  Judgement:  Poor  Insight:  Lacking  Psychomotor Activity:  Psychomotor Retardation  Concentration:  Concentration: Poor and Attention Span: Poor  Recall:  Poor  Fund of Knowledge:  Fair  Language:  Fair  Akathisia:  No  Handed:  Right  AIMS (if indicated):     Assets:  Communication Skills Desire for Improvement Financial Resources/Insurance Housing Intimacy Physical Health Resilience Social Support  ADL's:  Intact  Cognition:  WNL  Sleep:  Number of Hours: 7.45     Treatment Plan Summary: Daily contact with patient to assess and evaluate symptoms and progress in treatment and Medication management   Steven Knight is a 47 year old male with a history of bipolar disorder, hospitalized here no long ago for a manic episode, returns to the hospital depressed and suicidal.  #Suicidal ideation -patient able to contract to safety in the hospital  #Mood -continue Abilify 20 mg daily -continue depakote 250 mg in AM, 1000 mg in HS -add Effexor 150 mg daily  #Insomnia, did not sleep with Seroquel -Restoril 15 mg nightly   #Poor appetite -Ensure TID  #Labs completed recently  #Disposition -discharge with family -follow up with [facility]     Orson Slick, MD 07/01/2018, 10:26 AM

## 2018-06-30 NOTE — Tx Team (Addendum)
Interdisciplinary Treatment and Diagnostic Plan Update  06/30/2018 Time of Session: [place] MRN: [id number]  Principal Diagnosis: Bipolar I disorder, current or most recent episode depressed, with psychotic features ([place])  Secondary Diagnoses: Principal Problem:   Bipolar I disorder, current or most recent episode depressed, with psychotic features ([place]) Active Problems:   Suicidal ideation   Current Medications:  Current Facility-Administered Medications  Medication Dose Route Frequency Provider Last Rate Last Dose  . acetaminophen (TYLENOL) tablet 650 mg  650 mg Oral Q6H PRN Clapacs, John T, MD      . alum & mag hydroxide-simeth (MAALOX/MYLANTA) 200-200-20 MG/5ML suspension 30 mL  30 mL Oral Q4H PRN Clapacs, John T, MD      . ARIPiprazole (ABILIFY) tablet 20 mg  20 mg Oral Daily Clapacs, Madie Reno, MD   20 mg at 06/30/18 0755  . divalproex (DEPAKOTE) DR tablet 1,000 mg  1,000 mg Oral QHS Clapacs, Madie Reno, MD   1,000 mg at 06/29/18 2058  . divalproex (DEPAKOTE) DR tablet 250 mg  250 mg Oral Daily Clapacs, Madie Reno, MD   250 mg at 06/30/18 0755  . hydrOXYzine (ATARAX/VISTARIL) tablet 50 mg  50 mg Oral TID PRN Clapacs, John T, MD      . magnesium hydroxide (MILK OF MAGNESIA) suspension 30 mL  30 mL Oral Daily PRN Clapacs, John T, MD      . traZODone (DESYREL) tablet 100 mg  100 mg Oral QHS PRN Clapacs, Madie Reno, MD   100 mg at 06/29/18 2058   PTA Medications: Medications Prior to Admission  Medication Sig Dispense Refill Last Dose  . ARIPiprazole (ABILIFY) 20 MG tablet Take 1 tablet (20 mg total) by mouth daily. 30 tablet 0 06/28/2018 at 0700  . divalproex (DEPAKOTE) 250 MG DR tablet Take 1 tablet (250 mg total) by mouth daily with breakfast. 30 tablet 0 06/28/2018 at 0700  . divalproex (DEPAKOTE) 500 MG DR tablet Take 2 tablets (1,000 mg total) by mouth at bedtime. 60 tablet 0 06/27/2018 at 2100  . traZODone (DESYREL) 50 MG tablet Take 1 tablet (50 mg total) by mouth at  bedtime as needed for sleep. 30 tablet 0 06/27/2018 at 2100    Patient Stressors: Occupational concerns Traumatic event  Patient Strengths: Average or above average intelligence Communication skills Supportive family/friends  Treatment Modalities: Medication Management, Group therapy, Case management,  1 to 1 session with clinician, Psychoeducation, Recreational therapy.   Physician Treatment Plan for Primary Diagnosis: Bipolar I disorder, current or most recent episode depressed, with psychotic features ([place]) Long Term Goal(s):     Short Term Goals:    Medication Management: Evaluate patient's response, side effects, and tolerance of medication regimen.  Therapeutic Interventions: 1 to 1 sessions, Unit Group sessions and Medication administration.  Evaluation of Outcomes: Progressing  Physician Treatment Plan for Secondary Diagnosis: Principal Problem:   Bipolar I disorder, current or most recent episode depressed, with psychotic features ([place]) Active Problems:   Suicidal ideation  Long Term Goal(s):     Short Term Goals:       Medication Management: Evaluate patient's response, side effects, and tolerance of medication regimen.  Therapeutic Interventions: 1 to 1 sessions, Unit Group sessions and Medication administration.  Evaluation of Outcomes: Progressing   RN Treatment Plan for Primary Diagnosis: Bipolar I disorder, current or most recent episode depressed, with psychotic features ([place]) Long Term Goal(s): Knowledge of disease and therapeutic regimen to maintain health will improve  Short Term Goals: Ability to  participate in decision making will improve, Ability to identify and develop effective coping behaviors will improve and Compliance with prescribed medications will improve  Medication Management: RN will administer medications as ordered by provider, will assess and evaluate patient's response and provide education to patient for prescribed medication. RN  will report any adverse and/or side effects to prescribing provider.  Therapeutic Interventions: 1 on 1 counseling sessions, Psychoeducation, Medication administration, Evaluate responses to treatment, Monitor vital signs and CBGs as ordered, Perform/monitor CIWA, COWS, AIMS and Fall Risk screenings as ordered, Perform wound care treatments as ordered.  Evaluation of Outcomes: Progressing   LCSW Treatment Plan for Primary Diagnosis: Bipolar I disorder, current or most recent episode depressed, with psychotic features ([place]) Long Term Goal(s): Safe transition to appropriate next level of care at discharge, Engage patient in therapeutic group addressing interpersonal concerns.  Short Term Goals: Engage patient in aftercare planning with referrals and resources, Increase emotional regulation and Increase skills for wellness and recovery  Therapeutic Interventions: Assess for all discharge needs, 1 to 1 time with Social worker, Explore available resources and support systems, Assess for adequacy in community support network, Educate family and significant other(s) on suicide prevention, Complete Psychosocial Assessment, Interpersonal group therapy.  Evaluation of Outcomes: Progressing   Progress in Treatment: Attending groups: Yes. Participating in groups: Yes. Taking medication as prescribed: Yes. Toleration medication: Yes. Family/Significant other contact made: No, will contact:  support once pt provides consent Patient understands diagnosis: Yes. Discussing patient identified problems/goals with staff: Yes. Medical problems stabilized or resolved: Yes. Denies suicidal/homicidal ideation: Yes. Issues/concerns per patient self-inventory: No. Other: None at this time  New problem(s) identified: No, Describe:  None at this time.  New Short Term/Long Term Goal(s): Pt will demonstrate an improved mood by 48 hours prior to discharge, and will report 0 SI for at least 48 consecutive hours  prior to discharge.   Patient Goals:  Pt reported his goal for treatment is, "to get better."   Discharge Plan or Barriers: Pt will be discharged home and will continue tx in the outpatient setting with [facility].   Reason for Continuation of Hospitalization: Depression Medication stabilization  Estimated Length of Stay: 7 days    Attendees: Patient: Steven Knight 06/30/2018 11:28 AM  Physician: Dr. Bary Leriche, MD 06/30/2018 11:28 AM  Nursing: Elige Radon, RN 06/30/2018 11:28 AM  RN Care Manager: 06/30/2018 11:28 AM  Social Worker: Alden Hipp, LCSW 06/30/2018 11:28 AM  Recreational Therapist: Roanna Epley, CTRS-LRT 06/30/2018 11:28 AM  Other: Darin Engels, [place] 06/30/2018 11:28 AM  Other: Derrek Gu, LCSW 06/30/2018 11:28 AM  Other: Marney Doctor, [place]  06/30/2018 11:28 AM    Scribe for Treatment Team: Alden Hipp, LCSW 06/30/2018 11:28 AM

## 2018-07-01 MED ORDER — ENSURE ENLIVE PO LIQD
237.0000 mL | Freq: Three times a day (TID) | ORAL | Status: DC
  Administered 2018-07-01 – 2018-07-10 (×14): 237 mL via ORAL

## 2018-07-01 MED ORDER — TEMAZEPAM 15 MG PO CAPS
15.0000 mg | ORAL_CAPSULE | Freq: Every day | ORAL | Status: DC
Start: 2018-07-01 — End: 2018-07-02
  Administered 2018-07-01: 15 mg via ORAL
  Filled 2018-07-01: qty 1

## 2018-07-01 NOTE — Progress Notes (Signed)
Recreation Therapy Notes  Date: 07/01/2018  Time: 9:30 am   Location: Craft Room   Behavioral response: N/A   Intervention Topic: Happiness  Discussion/Intervention: Patient did not attend group.   Clinical Observations/Feedback:  Patient did not attend group.   Pepe Mineau LRT/CTRS        Letonya Mangels 07/01/2018 10:57 AM

## 2018-07-01 NOTE — Progress Notes (Signed)
Recreation Therapy Notes  INPATIENT RECREATION THERAPY ASSESSMENT  Patient Details Name: Steven Knight MRN: [id number] DOB: [birth year] Today's Date: 07/01/2018       Information Obtained From: Patient  Able to Participate in Assessment/Interview: Yes  Patient Presentation: Responsive  Reason for Admission (Per Patient): Active Symptoms, Suicidal Ideation, Other (Comments)(Depression)  Patient Stressors:    Coping Skills:      Leisure Interests (2+):  Sports - Swimming(I do not have any)  Frequency of Recreation/Participation: Monthly  Awareness of Community Resources:     [employer]:     Current Use:    If no, Barriers?:    Expressed Interest in [employer] Information:    [place] of Residence:  Insurance underwriter  Patient Main Form of Transportation: Musician  Patient Strengths:  I am nice  Patient Identified Areas of Improvement:  Being nicer to others  Patient Goal for Hospitalization:  To get better  Current SI (including self-harm):  No  Current HI:  No  Current AVH: No  Staff Intervention Plan: Group Attendance, Collaborate with Interdisciplinary Treatment Team  Consent to Intern Participation: N/A  Elzia Hott 07/01/2018, 2:02 PM

## 2018-07-01 NOTE — [facility] Group Notes (Signed)
  07/01/2018  Time: 1PM  Type of Therapy/Topic:  Group Therapy:  Balance in Life  Participation Level:  Did Not Attend  Description of Group:   This group will address the concept of balance and how it feels and looks when one is unbalanced. Patients will be encouraged to process areas in their lives that are out of balance and identify reasons for remaining unbalanced. Facilitators will guide patients in utilizing problem-solving interventions to address and correct the stressor making their life unbalanced. Understanding and applying boundaries will be explored and addressed for obtaining and maintaining a balanced life. Patients will be encouraged to explore ways to assertively make their unbalanced needs known to significant others in their lives, using other group members and facilitator for support and feedback.  Therapeutic Goals: 1. Patient will identify two or more emotions or situations they have that consume much of in their lives. 2. Patient will identify signs/triggers that life has become out of balance:  3. Patient will identify two ways to set boundaries in order to achieve balance in their lives:  4. Patient will demonstrate ability to communicate their needs through discussion and/or role plays  Summary of Patient Progress: Pt was invited to attend group but chose not to attend. CSW will continue to encourage pt to attend group throughout their admission.    Therapeutic Modalities:   Cognitive Behavioral Therapy Solution-Focused Therapy Assertiveness Training  Alden Hipp, MSW, LCSW Clinical Social Worker 07/01/2018 1:49 PM

## 2018-07-01 NOTE — Progress Notes (Addendum)
[facility] MD Progress Note  07/02/2018 10:45 AM Steven Knight  MRN:  [id number]  Subjective:    Steven Knight is a 47yo male with a history of bipolar disorder, recently hospitalized here for mania, now admitted for psychotic depression. He is suicidal, paranoid and has profound cognitive problems that surface during episodes. Continued on Depakote and Ailify, added Effexor.  Steven Knight is still very depressed, suicidal and confused. He has not been calling family and is still paranoid and guilty for "failing his family". He accepts medications and reports no side effects. He is secluded to his room, minimal interaction. No group participation.   Spoke with the wife. She will try to visit tonight.   Principal Problem: Bipolar I disorder, current or most recent episode depressed, with psychotic features [facility]) Diagnosis:   Patient Active Problem List   Diagnosis Date Noted  . Bipolar I disorder, current or most recent episode depressed, with psychotic features ([place]) [F31.5] 06/28/2018    Priority: High  . Bipolar I disorder, current or most recent episode manic, with psychotic features ([place]) [F31.2]     Priority: High  . Suicidal ideation [R45.851] 06/28/2018   Total Time spent with patient: 20 minutes  Past Psychiatric History: bipolar disorder  Past Medical History:  Past Medical History:  Diagnosis Date  . Autism   . Autism    History reviewed. No pertinent surgical history. Family History: History reviewed. No pertinent family history. Family Psychiatric  History: none Social History:  Social History   Substance and Sexual Activity  Alcohol Use Never  . Frequency: Never     Social History   Substance and Sexual Activity  Drug Use Never    Social History   Socioeconomic History  . Marital status: Married    Spouse name: Not on file  . Number of children: Not on file  . Years of education: Not on file  . Highest education level: Not on file  Occupational  History  . Not on file  Social Needs  . Financial resource strain: Not on file  . Food insecurity:    Worry: Not on file    Inability: Not on file  . Transportation needs:    Medical: Not on file    Non-medical: Not on file  Tobacco Use  . Smoking status: Never Smoker  . Smokeless tobacco: Never Used  Substance and Sexual Activity  . Alcohol use: Never    Frequency: Never  . Drug use: Never  . Sexual activity: Yes  Lifestyle  . Physical activity:    Days per week: Not on file    Minutes per session: Not on file  . Stress: Not on file  Relationships  . Social connections:    Talks on phone: Not on file    Gets together: Not on file    Attends religious service: Not on file    Active member of club or organization: Not on file    Attends meetings of clubs or organizations: Not on file    Relationship status: Not on file  Other Topics Concern  . Not on file  Social History Narrative  . Not on file   Additional Social History:                         Sleep: Poor  Appetite:  Poor  Current Medications: Current Facility-Administered Medications  Medication Dose Route Frequency Provider Last Rate Last Dose  . acetaminophen (TYLENOL) tablet 650 mg  650 mg Oral Q6H PRN Clapacs, John T, MD      . alum & mag hydroxide-simeth (MAALOX/MYLANTA) 200-200-20 MG/5ML suspension 30 mL  30 mL Oral Q4H PRN Clapacs, John T, MD      . ARIPiprazole (ABILIFY) tablet 20 mg  20 mg Oral Daily Clapacs, Madie Reno, MD   20 mg at 07/02/18 0819  . divalproex (DEPAKOTE) DR tablet 1,000 mg  1,000 mg Oral QHS Clapacs, John T, MD   1,000 mg at 07/01/18 2137  . divalproex (DEPAKOTE) DR tablet 250 mg  250 mg Oral Daily Clapacs, Madie Reno, MD   250 mg at 07/02/18 0820  . feeding supplement (ENSURE ENLIVE) (ENSURE ENLIVE) liquid 237 mL  237 mL Oral TID BM Noela Brothers B, MD   237 mL at 07/01/18 1958  . hydrOXYzine (ATARAX/VISTARIL) tablet 50 mg  50 mg Oral TID PRN Clapacs, John T, MD      .  magnesium hydroxide (MILK OF MAGNESIA) suspension 30 mL  30 mL Oral Daily PRN Clapacs, John T, MD      . temazepam (RESTORIL) capsule 15 mg  15 mg Oral QHS Jerrell Hart B, MD   15 mg at 07/01/18 2137  . venlafaxine XR (EFFEXOR-XR) 24 hr capsule 225 mg  225 mg Oral Q breakfast Bran Aldridge B, MD   225 mg at 07/02/18 0820    Lab Results: No results found for this or any previous visit (from the past 48 hour(s)).  Blood Alcohol level:  Lab Results  Component Value Date   ETH <10 06/28/2018   ETH <10 34/19/3790    Metabolic Disorder Labs: Lab Results  Component Value Date   HGBA1C 5.5 05/09/2018   MPG 111.15 05/09/2018   No results found for: PROLACTIN Lab Results  Component Value Date   CHOL 145 05/09/2018   TRIG 104 05/09/2018   HDL 35 (L) 05/09/2018   CHOLHDL 4.1 05/09/2018   VLDL 21 05/09/2018   LDLCALC 89 05/09/2018    Physical Findings: AIMS:  , ,  ,  ,    CIWA:    COWS:     Musculoskeletal: Strength & Muscle Tone: within normal limits Gait & Station: normal Patient leans: N/A  Psychiatric Specialty Exam: Physical Exam  Nursing note and vitals reviewed. Psychiatric: His affect is blunt. His speech is delayed. He is slowed and withdrawn. Thought content is paranoid and delusional. Cognition and memory are impaired. He expresses impulsivity. He exhibits a depressed mood. He expresses suicidal ideation.    Review of Systems  Neurological: Negative.   Psychiatric/Behavioral: Positive for depression and suicidal ideas. The patient has insomnia.   All other systems reviewed and are negative.   Blood pressure 112/85, pulse (!) 105, temperature 97.9 F (36.6 C), temperature source Oral, resp. rate 18, height 6' 2"  (1.88 m), weight 77.6 kg (171 lb), SpO2 97 %.Body mass index is 21.96 kg/m.  General Appearance: Casual  Eye Contact:  Good  Speech:  Slow  Volume:  Decreased  Mood:  Depressed  Affect:  Flat  Thought Process:  Goal Directed and  Descriptions of Associations: Intact  Orientation:  Full (Time, Place, and Person)  Thought Content:  Delusions and Paranoid Ideation  Suicidal Thoughts:  Yes.  with intent/plan  Homicidal Thoughts:  No  Memory:  Immediate;   Poor Recent;   Poor Remote;   Poor  Judgement:  Impaired  Insight:  Shallow  Psychomotor Activity:  Psychomotor Retardation  Concentration:  Concentration: Poor and Attention Span:  Poor  Recall:  Poor  Fund of Knowledge:  Poor  Language:  Poor  Akathisia:  No  Handed:  Right  AIMS (if indicated):     Assets:  Desire for Improvement Financial Resources/Insurance Housing Intimacy Physical Health Resilience Social Support  ADL's:  Intact  Cognition:  Impaired,  Mild  Sleep:  Number of Hours: 6.15     Treatment Plan Summary: Daily contact with patient to assess and evaluate symptoms and progress in treatment and Medication management   Mr. Ehle is a 47 year old male with a history of bipolar disorder, hospitalized here no long ago for a manic episode, returns to the hospital depressed and suicidal.  #Suicidal ideation, passing thoughts of suicide -patient able to contract to safety in the hospital  #Mood, no improvement -continue Abilify 20 mg daily -continue depakote 250 mg in AM, 1000 mg in HS -increase Effexor to 225 mg daily   #Insomnia, improved -Restoril 15 mg nightly   #Poor appetite -Ensure TID  #Labs completed recently  #Disposition -discharge with family -follow up with [facility]    Orson Slick, MD 07/02/2018, 10:45 AM

## 2018-07-01 NOTE — Progress Notes (Signed)
D- Patient alert and oriented. Patient presents in a pleasant mood stating that he didn't sleep well last night because he "tossed and turned" all night. Patient is very child-like and was delayed in his responses to this writer's questions. Patient rates his depression a "10/10" stating that he is "hopeless and I don't know what's going to happen to me long term". Patient rates his anxiety a "9/10" but he does not know why he's feeling this way. Patient denies SI, HI, AVH, and pain at this time. Patient has no stated goals for today.  A- Scheduled medications administered to patient, per MD orders. Support and encouragement provided.  Routine safety checks conducted every 15 minutes.  Patient informed to notify staff with problems or concerns.  R- No adverse drug reactions noted. Patient contracts for safety at this time. Patient compliant with medications and treatment plan. Patient receptive, calm, and cooperative. Patient interacts well with others on the unit.  Patient remains safe at this time.

## 2018-07-01 NOTE — [facility] Group Notes (Signed)
LCSW Group Therapy Note 07/01/2018 9:00 AM  Type of Therapy and Topic:  Group Therapy:  Setting Goals  Participation Level:  Did Not Attend  Description of Group: In this process group, patients discussed using strengths to work toward goals and address challenges.  Patients identified two positive things about themselves and one goal they were working on.  Patients were given the opportunity to share openly and support each other's plan for self-empowerment.  The group discussed the value of gratitude and were encouraged to have a daily reflection of positive characteristics or circumstances.  Patients were encouraged to identify a plan to utilize their strengths to work on current challenges and goals.  Therapeutic Goals 1. Patient will verbalize personal strengths/positive qualities and relate how these can assist with achieving desired personal goals 2. Patients will verbalize affirmation of peers plans for personal change and goal setting 3. Patients will explore the value of gratitude and positive focus as related to successful achievement of goals 4. Patients will verbalize a plan for regular reinforcement of personal positive qualities and circumstances.  Summary of Patient Progress:  Steven Knight was invited to today's group, but chose not to attend.     Therapeutic Modalities Cognitive Behavioral Therapy Motivational Interviewing    Steven Knight, Marlinda Mike 07/01/2018 12:32 PM

## 2018-07-01 NOTE — Plan of Care (Signed)
Patient slept for Estimated Hours of 7.45; Precautionary checks every 15 minutes for safety maintained, room free of safety hazards, patient sustains no injury or falls during this shift.  Patient mostly isolative to room, out for snacks and medications, denied pain, still endorsing auditory hallucinations but "they are getting better, they are not as loud..." denied SI/SIB, room closer to the nurses' station for close observation.  Problem: Education: Goal: Emotional status will improve Outcome: Progressing Goal: Verbalization of understanding the information provided will improve Outcome: Progressing   Problem: Activity: Goal: Sleeping patterns will improve Outcome: Progressing   Problem: Health Behavior/Discharge Planning: Goal: Compliance with treatment plan for underlying cause of condition will improve Outcome: Progressing

## 2018-07-02 MED ORDER — VENLAFAXINE HCL ER 75 MG PO CP24
225.0000 mg | ORAL_CAPSULE | Freq: Every day | ORAL | Status: DC
  Administered 2018-07-02 – 2018-07-05 (×4): 225 mg via ORAL
  Filled 2018-07-02 (×4): qty 3

## 2018-07-02 MED ORDER — TEMAZEPAM 15 MG PO CAPS
30.0000 mg | ORAL_CAPSULE | Freq: Every day | ORAL | Status: DC
  Administered 2018-07-02: 30 mg via ORAL
  Filled 2018-07-02: qty 2

## 2018-07-02 NOTE — Plan of Care (Addendum)
Isolative to room, mood and affect flat, delayed cognitive response, appeared confused, denied pain, denied SI/HI/AVH today  Patient slept for Estimated Hours of 6.15; Precautionary checks every 15 minutes for safety maintained, room free of safety hazards, patient sustains no injury or falls during this shift.  Problem: Education: Goal: Emotional status will improve Outcome: Progressing Goal: Mental status will improve Outcome: Progressing   Problem: Activity: Goal: Sleeping patterns will improve Outcome: Progressing   Problem: Health Behavior/Discharge Planning: Goal: Compliance with treatment plan for underlying cause of condition will improve Outcome: Progressing

## 2018-07-02 NOTE — Plan of Care (Signed)
Pt. Verbalizes understanding of provided education. Pt. Reports sleeping adequate. Pt. Compliant with medications, but does refuse ensure. Pt. Denies SI/HI and verbally contracts for safety. Pt. Reports he can remain safe while on the unit. Pt. Frequently isolative and withdrawn to his room this evening with less participation in unit activities and presence.    Problem: Activity: Goal: Interest or engagement in activities will improve Outcome: Not Progressing   Problem: Education: Goal: Knowledge of [place] [facility] Education information/materials will improve Outcome: Progressing   Problem: Education: Goal: Knowledge of [place] General Education information/materials will improve Outcome: Progressing   Problem: Activity: Goal: Sleeping patterns will improve Outcome: Progressing   Problem: Health Behavior/Discharge Planning: Goal: Compliance with treatment plan for underlying cause of condition will improve Outcome: Progressing   Problem: Safety: Goal: Ability to disclose and discuss suicidal ideas will improve Outcome: Progressing   Problem: Education: Goal: Knowledge of General Education information will improve Description Including pain rating scale, medication(s)/side effects and non-pharmacologic comfort measures Outcome: Progressing

## 2018-07-02 NOTE — Progress Notes (Signed)
Recreation Therapy Notes  Date: 07/02/2018  Time: 9:30 am   Location: Craft Room   Behavioral response: N/A   Intervention Topic: Coping Skills  Discussion/Intervention: Patient did not attend group.   Clinical Observations/Feedback:  Patient did not attend group.   Chandell Attridge LRT/CTRS         Blanka Rockholt 07/02/2018 11:16 AM

## 2018-07-02 NOTE — Progress Notes (Signed)
D: Pt denies SI/HI/AVH. Pt during assessments presents with a flat facial expression and forwards little, but is cooperative. Pt. Frequently isolative and withdrawn this evening with little participation and observation around the milieu. Pt. Reports depression when asked several times at, "2/10", but isolative behaviors and expression feels higher rating in depression. Pt. Has no other reports or complaints. Pt. Denies pain. Pt. Asked about suicidal feeling on several interactions and assessment tools, but denies all.     A: Q x 15 minute observation checks were completed for safety. Patient was provided with education.  Patient was given medications per orders. Patient  was encourage to attend groups, participate in unit activities and continue with plan of care. Pt. Chart and plans of care reviewed. Pt. Given support and encouragement.   R: Patient is complaint with medication and unit procedures.              Precautionary checks every 15 minutes for safety maintained, room free of safety hazards, patient sustains no injury or falls during this shift. Will endorse care to next shift.

## 2018-07-02 NOTE — BHH Group Notes (Signed)

## 2018-07-02 NOTE — Plan of Care (Addendum)
Patient is alert and oriented X 4, denies HI and AVH. Patient is very isolative with depressed affect. Patient having SI thoughts, no plan. Patient complains of poor appetite, anxiety and depression. Patient states he has not been attending groups but will try to attend a group today. Patient did not eat breakfast today, interaction is minimal. Patient contracts for safety verbally.  Nurse will encourage patient to eat and participate in group therapies. Problem: Education: Goal: Knowledge of [place] General Education information/materials will improve Outcome: Not Progressing Goal: Emotional status will improve Outcome: Not Progressing Goal: Mental status will improve Outcome: Not Progressing Goal: Verbalization of understanding the information provided will improve Outcome: Not Progressing   Problem: Activity: Goal: Interest or engagement in activities will improve Outcome: Not Progressing Goal: Sleeping patterns will improve Outcome: Not Progressing   Problem: Health Behavior/Discharge Planning: Goal: Identification of resources available to assist in meeting health care needs will improve Outcome: Not Progressing Goal: Compliance with treatment plan for underlying cause of condition will improve Outcome: Not Progressing

## 2018-07-02 NOTE — [facility] Group Notes (Signed)
[place] Group Notes:  (Nursing/MHT/Case Management/Adjunct)  Date:  07/02/2018  Time:  3:31 AM  Type of Therapy:  Group Therapy  Participation Level:  Did Not Attend  Nehemiah Settle 07/02/2018, 3:31 AM

## 2018-07-03 MED ORDER — TEMAZEPAM 15 MG PO CAPS: 15.0000 mg | ORAL_CAPSULE | Freq: Every day | ORAL | Status: DC

## 2018-07-03 MED ORDER — TEMAZEPAM 15 MG PO CAPS
15.0000 mg | ORAL_CAPSULE | Freq: Every evening | ORAL | Status: DC | PRN
  Filled 2018-07-03: qty 1

## 2018-07-03 NOTE — [facility] Group Notes (Signed)
LCSW Group Therapy Note  07/03/2018 1:15pm  Type of Therapy and Topic:  Group Therapy:  Cognitive Distortions  Participation Level:  Did Not Attend   Description of Group:    Patients in this group will be introduced to the topic of cognitive distortions.  Patients will identify and describe cognitive distortions, describe the feelings these distortions create for them.  Patients will identify one or more situations in their personal life where they have cognitively distorted thinking and will verbalize challenging this cognitive distortion through positive thinking skills.  Patients will practice the skill of using positive affirmations to challenge cognitive distortions using affirmation cards.    Therapeutic Goals:  1. Patient will identify two or more cognitive distortions they have used 2. Patient will identify one or more emotions that stem from use of a cognitive distortion 3. Patient will demonstrate use of a positive affirmation to counter a cognitive distortion through discussion and/or role play. 4. Patient will describe one way cognitive distortions can be detrimental to wellness   Summary of Patient Progress: Pt was invited to attend group but chose not to attend. CSW will continue to encourage pt to attend group throughout their admission.      Therapeutic Modalities:   Cognitive Behavioral Therapy Motivational Interviewing   Hadassa Cermak  CUEBAS-COLON, LCSW 07/03/2018 3:55 PM

## 2018-07-03 NOTE — Progress Notes (Signed)
[facility] MD Progress Note  07/03/2018 5:25 PM Steven Knight  MRN:  [id number]  Subjective:   Pt seen and chart reveiwed. Steven Knight is a 47yo male with a history of bipolar disorder, recently hospitalized here for mania with psychotic feature. Steven Knight was suicidal, paranoid and has profound cognitive problems that surface during episodes. Continued on Depakote and Ailify, added Effexor.  Today, Steven Knight reports feeling well, very pleasant. Steven Knight tolerates all his meds without complaint and denied SI.   Principal Problem: Bipolar I disorder, current or most recent episode depressed, with psychotic features ([place]) Diagnosis:   Patient Active Problem List   Diagnosis Date Noted  . Bipolar I disorder, current or most recent episode depressed, with psychotic features ([place]) [F31.5] 06/28/2018  . Suicidal ideation [R45.851] 06/28/2018  . Bipolar I disorder, current or most recent episode manic, with psychotic features ([place]) [F31.2]    Total Time spent with patient: 20 minutes  Past Psychiatric History: bipolar disorder  Past Medical History:  Past Medical History:  Diagnosis Date  . Autism   . Autism    History reviewed. No pertinent surgical history. Family History: History reviewed. No pertinent family history. Family Psychiatric  History: none Social History:  Social History   Substance and Sexual Activity  Alcohol Use Never  . Frequency: Never     Social History   Substance and Sexual Activity  Drug Use Never    Social History   Socioeconomic History  . Marital status: Married    Spouse name: Not on file  . Number of children: Not on file  . Years of education: Not on file  . Highest education level: Not on file  Occupational History  . Not on file  Social Needs  . Financial resource strain: Not on file  . Food insecurity:    Worry: Not on file    Inability: Not on file  . Transportation needs:    Medical: Not on file    Non-medical: Not on file  Tobacco Use  . Smoking  status: Never Smoker  . Smokeless tobacco: Never Used  Substance and Sexual Activity  . Alcohol use: Never    Frequency: Never  . Drug use: Never  . Sexual activity: Yes  Lifestyle  . Physical activity:    Days per week: Not on file    Minutes per session: Not on file  . Stress: Not on file  Relationships  . Social connections:    Talks on phone: Not on file    Gets together: Not on file    Attends religious service: Not on file    Active member of club or organization: Not on file    Attends meetings of clubs or organizations: Not on file    Relationship status: Not on file  Other Topics Concern  . Not on file  Social History Narrative  . Not on file    Sleep: Poor  Appetite:  Poor  Current Medications: Current Facility-Administered Medications  Medication Dose Route Frequency Provider Last Rate Last Dose  . acetaminophen (TYLENOL) tablet 650 mg  650 mg Oral Q6H PRN Clapacs, John T, MD      . alum & mag hydroxide-simeth (MAALOX/MYLANTA) 200-200-20 MG/5ML suspension 30 mL  30 mL Oral Q4H PRN Clapacs, John T, MD      . ARIPiprazole (ABILIFY) tablet 20 mg  20 mg Oral Daily Clapacs, Madie Reno, MD   20 mg at 07/03/18 0823  . divalproex (DEPAKOTE) DR tablet 1,000 mg  1,000  mg Oral QHS Clapacs, Madie Reno, MD   1,000 mg at 07/02/18 2102  . divalproex (DEPAKOTE) DR tablet 250 mg  250 mg Oral Daily Clapacs, Madie Reno, MD   250 mg at 07/03/18 0824  . feeding supplement (ENSURE ENLIVE) (ENSURE ENLIVE) liquid 237 mL  237 mL Oral TID BM Pucilowska, Jolanta B, MD   237 mL at 07/03/18 1400  . hydrOXYzine (ATARAX/VISTARIL) tablet 50 mg  50 mg Oral TID PRN Clapacs, John T, MD      . magnesium hydroxide (MILK OF MAGNESIA) suspension 30 mL  30 mL Oral Daily PRN Clapacs, John T, MD      . temazepam (RESTORIL) capsule 30 mg  30 mg Oral QHS Pucilowska, Jolanta B, MD   30 mg at 07/02/18 2102  . venlafaxine XR (EFFEXOR-XR) 24 hr capsule 225 mg  225 mg Oral Q breakfast Pucilowska, Jolanta B, MD   225 mg at  07/03/18 2633    Lab Results: No results found for this or any previous visit (from the past 48 hour(s)).  Blood Alcohol level:  Lab Results  Component Value Date   ETH <10 06/28/2018   ETH <10 35/45/6256    Metabolic Disorder Labs: Lab Results  Component Value Date   HGBA1C 5.5 05/09/2018   MPG 111.15 05/09/2018   No results found for: PROLACTIN Lab Results  Component Value Date   CHOL 145 05/09/2018   TRIG 104 05/09/2018   HDL 35 (L) 05/09/2018   CHOLHDL 4.1 05/09/2018   VLDL 21 05/09/2018   LDLCALC 89 05/09/2018    Physical Findings: AIMS:  , ,  ,  ,    CIWA:    COWS:     Musculoskeletal: Strength & Muscle Tone: within normal limits Gait & Station: normal Patient leans: N/A  Psychiatric Specialty Exam: Physical Exam  Nursing note and vitals reviewed. Psychiatric: His affect is blunt. His speech is delayed. Steven Knight is slowed and withdrawn. Thought content is paranoid and delusional. Cognition and memory are impaired. Steven Knight expresses impulsivity. Steven Knight exhibits a depressed mood. Steven Knight expresses suicidal ideation.    Review of Systems  Neurological: Negative.   Psychiatric/Behavioral: Positive for depression and suicidal ideas. The patient has insomnia.   All other systems reviewed and are negative.   Blood pressure 109/84, pulse 97, temperature 97.6 F (36.4 C), temperature source Oral, resp. rate 18, height 6' 2"  (1.88 m), weight 77.6 kg (171 lb), SpO2 97 %.Body mass index is 21.96 kg/m.  General Appearance: Casual and Fairly Groomed  Eye Contact:  Fair  Speech:  Clear and Coherent and Normal Rate  Volume:  Decreased  Mood:  Depressed  Affect:  Flat  Thought Process:  Goal Directed and Descriptions of Associations: Intact  Orientation:  Full (Time, Place, and Person)  Thought Content:  Delusions and Paranoid Ideation  Suicidal Thoughts:  Yes.  with intent/plan  Homicidal Thoughts:  No  Memory:  Immediate;   Poor Recent;   Poor Remote;   Poor  Judgement:  Impaired   Insight:  Lacking  Psychomotor Activity:  Psychomotor Retardation  Concentration:  Concentration: Fair and Attention Span: Fair  Recall:  Poor  Fund of Knowledge:  Poor  Language:  Poor  Akathisia:  No  Handed:  Right  AIMS (if indicated):     Assets:  Desire for Improvement Housing Physical Health Social Support  ADL's:  Intact  Cognition:  Impaired,  Mild  Sleep:  Number of Hours: 8     Treatment Plan Summary:  Daily contact with patient to assess and evaluate symptoms and progress in treatment and Medication management   Steven Knight is a 47 year old male with a history of bipolar disorder, hospitalized here no long ago for a manic episode, returns to the hospital depressed and suicidal.  #Suicidal ideation, passing thoughts of suicide -patient able to contract to safety in the hospital  #Bipoar, MRE: depressed, improving.  -continue Abilify 20 mg daily -continue depakote 250 mg in AM, 1000 mg in HS -continue Effexor 225 mg daily   #Insomnia, improved -change Restoril 15 mg to nightly PRN   #Poor appetite -Ensure TID  #Labs completed recently  #Disposition -discharge with family -follow up with [facility]    Thimothy Barretta, MD 07/03/2018, 5:25 PM

## 2018-07-03 NOTE — Plan of Care (Addendum)
D: Pt denies SI/HI/AV hallucinations. Pt is appears flat and depressed. Patient is pleasant and cooperative with assessment. Patient has minimal interaction with this Probation officer.  A: Pt was offered support and encouragement. Pt was given scheduled medications. Pt was encourage to attend groups. Q 15 minute checks were done for safety.  R: Pt is taking medication. Pt has no complaints.Pt receptive to treatment and safety maintained on unit.    Problem: Safety: Goal: Ability to disclose and discuss suicidal ideas will improve Outcome: Progressing

## 2018-07-04 NOTE — Progress Notes (Signed)
D: Pt denies SI/HI/AVH. Pt is pleasant and cooperative, but forwards little with thought blocking behaviors. Pt. has no Complaints.  Patient interaction is minimal and is frequently isolative and withdrawn. Pt. Eye contact is avertive and when asked about depression and anxiety denies all and reports, "I'm just tired".     A: Q x 15 minute observation checks were completed for safety. Patient was provided with education.  Patient was given/offered medications per orders. Patient  was encourage to attend groups, participate in unit activities and continue with plan of care. Pt. Chart and plans of care reviewed. Pt. Given support and encouragement.   R: Patient is complaint with medication and unit procedures, but requires encouragement for unit participation. Pt. Refuses ensure this evening. Will notify treatment team of ensure refusal. Pt. Does not want ensures he states.             Precautionary checks every 15 minutes for safety maintained, room free of safety hazards, patient sustains no injury or falls during this shift. Will endorse care to next shift.

## 2018-07-04 NOTE — Plan of Care (Signed)
Pt. Verbalizes understanding of provided education. Pt. Compliant with medications and treatment plan, but needs encouragement to participate in groups and unit activities. Pt. Denies SI/HI. Pt. Verbally is able to contract for safety.    Problem: Activity: Goal: Interest or engagement in activities will improve Outcome: Not Progressing   Problem: Education: Goal: Knowledge of [place] [facility] Education information/materials will improve Outcome: Progressing   Problem: Health Behavior/Discharge Planning: Goal: Compliance with treatment plan for underlying cause of condition will improve Outcome: Progressing   Problem: Safety: Goal: Ability to disclose and discuss suicidal ideas will improve Outcome: Progressing   Problem: Education: Goal: Knowledge of General Education information will improve Description Including pain rating scale, medication(s)/side effects and non-pharmacologic comfort measures Outcome: Progressing

## 2018-07-04 NOTE — Plan of Care (Signed)
Patient is 47 year old male complains of depression. Patient denies having thoughts today of SI, HI, or auditory or visual hallucinations. Patient affect is apathetic and depressed, facial expression very flat. Patient is compliant with medications and appropriate with staff and peers, but isolates to room majority of the day. Hygiene is unremarkable, patient showers, and appetite has increased. Nurse will continue to monitor and encourage participation in group therapy on the unit. Problem: Education: Goal: Knowledge of [place] [facility] Education information/materials will improve Outcome: Progressing Goal: Emotional status will improve Outcome: Not Progressing Goal: Mental status will improve Outcome: Progressing Goal: Verbalization of understanding the information provided will improve Outcome: Progressing   Problem: Activity: Goal: Interest or engagement in activities will improve Outcome: Not Progressing Goal: Sleeping patterns will improve Outcome: Progressing   Problem: Health Behavior/Discharge Planning: Goal: Identification of resources available to assist in meeting health care needs will improve Outcome: Progressing Goal: Compliance with treatment plan for underlying cause of condition will improve Outcome: Progressing

## 2018-07-04 NOTE — Plan of Care (Signed)
Calm and cooperative but isolative in room. Compliant with HS medications.

## 2018-07-04 NOTE — Progress Notes (Signed)
Patient stayed in room and did not attend HS group. Was able to come to the medication room. Denied HI/SI/hallucinations. Denied medical/health concerns. Received medications and returned to his room, refusing to engage in long conversations. Was encouraged to talk to staff as needed. Currently in bed sleeping and safety maintained.

## 2018-07-04 NOTE — [facility] Group Notes (Signed)
LCSW Group Therapy Note 07/04/2018 1:15pm  Type of Therapy and Topic: Group Therapy: Feelings Around Returning Home & Establishing a Supportive Framework and Supporting Oneself When Supports Not Available  Participation Level: Did Not Attend  Description of Group:  Patients first processed thoughts and feelings about upcoming discharge. These included fears of upcoming changes, lack of change, new living environments, judgements and expectations from others and overall stigma of mental health issues. The group then discussed the definition of a supportive framework, what that looks and feels like, and how do to discern it from an unhealthy non-supportive network. The group identified different types of supports as well as what to do when your family/friends are less than helpful or unavailable  Therapeutic Goals  1. Patient will identify one healthy supportive network that they can use at discharge. 2. Patient will identify one factor of a supportive framework and how to tell it from an unhealthy network. 3. Patient able to identify one coping skill to use when they do not have positive supports from others. 4. Patient will demonstrate ability to communicate their needs through discussion and/or role plays.  Summary of Patient Progress:  Pt was invited to attend group but chose not to attend. CSW will continue to encourage pt to attend group throughout their admission.   Therapeutic Modalities Cognitive Behavioral Therapy Motivational Interviewing   Paelyn Smick  CUEBAS-COLON, LCSW 07/04/2018 9:51 AM

## 2018-07-04 NOTE — Progress Notes (Signed)
[facility] MD Progress Note  07/04/2018 1:34 PM LINDEN TAGLIAFERRO  MRN:  [id number]  Subjective:   Pt seen and chart reveiwed. Mr. Plante is a 47yo male with a history of bipolar disorder, recently hospitalized here for mania with psychotic feature. Taden Witter was suicidal, paranoid and has profound cognitive problems that surface during episodes. Continued on Depakote and Ailify, added Effexor.  Today, Darcell Sabino continues to report doing well, tolerate meds without complaint. No SI. Slept well last night without temazepam.   Principal Problem: Bipolar I disorder, current or most recent episode depressed, with psychotic features ([place]) Diagnosis:   Patient Active Problem List   Diagnosis Date Noted  . Bipolar I disorder, current or most recent episode depressed, with psychotic features ([place]) [F31.5] 06/28/2018  . Suicidal ideation [R45.851] 06/28/2018  . Bipolar I disorder, current or most recent episode manic, with psychotic features ([place]) [F31.2]    Total Time spent with patient: 20 minutes  Past Psychiatric History: bipolar disorder  Past Medical History:  Past Medical History:  Diagnosis Date  . Autism   . Autism    History reviewed. No pertinent surgical history. Family History: History reviewed. No pertinent family history. Family Psychiatric  History: none Social History:  Social History   Substance and Sexual Activity  Alcohol Use Never  . Frequency: Never     Social History   Substance and Sexual Activity  Drug Use Never    Social History   Socioeconomic History  . Marital status: Married    Spouse name: Not on file  . Number of children: Not on file  . Years of education: Not on file  . Highest education level: Not on file  Occupational History  . Not on file  Social Needs  . Financial resource strain: Not on file  . Food insecurity:    Worry: Not on file    Inability: Not on file  . Transportation needs:    Medical: Not on file    Non-medical: Not on file   Tobacco Use  . Smoking status: Never Smoker  . Smokeless tobacco: Never Used  Substance and Sexual Activity  . Alcohol use: Never    Frequency: Never  . Drug use: Never  . Sexual activity: Yes  Lifestyle  . Physical activity:    Days per week: Not on file    Minutes per session: Not on file  . Stress: Not on file  Relationships  . Social connections:    Talks on phone: Not on file    Gets together: Not on file    Attends religious service: Not on file    Active member of club or organization: Not on file    Attends meetings of clubs or organizations: Not on file    Relationship status: Not on file  Other Topics Concern  . Not on file  Social History Narrative  . Not on file    Sleep: Poor  Appetite:  Poor  Current Medications: Current Facility-Administered Medications  Medication Dose Route Frequency Provider Last Rate Last Dose  . acetaminophen (TYLENOL) tablet 650 mg  650 mg Oral Q6H PRN Clapacs, John T, MD      . alum & mag hydroxide-simeth (MAALOX/MYLANTA) 200-200-20 MG/5ML suspension 30 mL  30 mL Oral Q4H PRN Clapacs, John T, MD      . ARIPiprazole (ABILIFY) tablet 20 mg  20 mg Oral Daily Clapacs, Madie Reno, MD   20 mg at 07/04/18 0815  . divalproex (DEPAKOTE) DR tablet 1,000 mg  1,000 mg Oral QHS Clapacs, Madie Reno, MD   1,000 mg at 07/03/18 2231  . divalproex (DEPAKOTE) DR tablet 250 mg  250 mg Oral Daily Clapacs, Madie Reno, MD   250 mg at 07/04/18 0815  . feeding supplement (ENSURE ENLIVE) (ENSURE ENLIVE) liquid 237 mL  237 mL Oral TID BM Pucilowska, Jolanta B, MD   237 mL at 07/03/18 2231  . hydrOXYzine (ATARAX/VISTARIL) tablet 50 mg  50 mg Oral TID PRN Clapacs, Madie Reno, MD      . magnesium hydroxide (MILK OF MAGNESIA) suspension 30 mL  30 mL Oral Daily PRN Clapacs, John T, MD      . temazepam (RESTORIL) capsule 15 mg  15 mg Oral QHS PRN Zaliah Wissner, MD      . venlafaxine XR (EFFEXOR-XR) 24 hr capsule 225 mg  225 mg Oral Q breakfast Pucilowska, Jolanta B, MD   225 mg at  07/04/18 0815    Lab Results: No results found for this or any previous visit (from the past 76 hour(s)).  Blood Alcohol level:  Lab Results  Component Value Date   ETH <10 06/28/2018   ETH <10 83/38/2505    Metabolic Disorder Labs: Lab Results  Component Value Date   HGBA1C 5.5 05/09/2018   MPG 111.15 05/09/2018   No results found for: PROLACTIN Lab Results  Component Value Date   CHOL 145 05/09/2018   TRIG 104 05/09/2018   HDL 35 (L) 05/09/2018   CHOLHDL 4.1 05/09/2018   VLDL 21 05/09/2018   LDLCALC 89 05/09/2018    Physical Findings: AIMS:  , ,  ,  ,    CIWA:    COWS:     Musculoskeletal: Strength & Muscle Tone: within normal limits Gait & Station: normal Patient leans: N/A  Psychiatric Specialty Exam: Physical Exam  Nursing note and vitals reviewed. Psychiatric: His affect is blunt. His speech is delayed. Benita Boonstra is slowed and withdrawn. Thought content is paranoid and delusional. Cognition and memory are impaired. Deontra Pereyra expresses impulsivity. Ladarian Bonczek exhibits a depressed mood. Hani Campusano expresses suicidal ideation.    Review of Systems  Neurological: Negative.   Psychiatric/Behavioral: Positive for depression and suicidal ideas. The patient has insomnia.   All other systems reviewed and are negative.   Blood pressure 110/77, pulse 90, temperature 97.8 F (36.6 C), temperature source Oral, resp. rate 18, height 6' 2"  (1.88 m), weight 77.6 kg (171 lb), SpO2 97 %.Body mass index is 21.96 kg/m.  General Appearance: Casual and Fairly Groomed  Eye Contact:  Fair  Speech:  Clear and Coherent and Normal Rate  Volume:  Normal  Mood:  Depressed  Affect:  Flat  Thought Process:  Goal Directed and Descriptions of Associations: Intact  Orientation:  Full (Time, Place, and Person)  Thought Content:  Delusions and Paranoid Ideation  Suicidal Thoughts:  No  Homicidal Thoughts:  No  Memory:  Immediate;   Fair Recent;   Fair Remote;   Fair  Judgement:  Impaired  Insight:  Lacking   Psychomotor Activity:  Psychomotor Retardation  Concentration:  Concentration: Fair and Attention Span: Fair  Recall:  Poor  Fund of Knowledge:  Poor  Language:  Poor  Akathisia:  No  Handed:  Right  AIMS (if indicated):     Assets:  Desire for Improvement Housing Physical Health Social Support  ADL's:  Intact  Cognition:  Impaired,  Mild  Sleep:  Number of Hours: 6     Treatment Plan Summary: Daily contact with patient to  assess and evaluate symptoms and progress in treatment and Medication management   Mr. Savant is a 47 year old male with a history of bipolar disorder, hospitalized here no long ago for a manic episode, returns to the hospital depressed and suicidal.  #Suicidal ideation, passing thoughts of suicide -patient able to contract to safety in the hospital  #Bipoar, MRE: depressed, improving.  -continue Abilify 20 mg daily -continue depakote 250 mg in AM, 1000 mg in HS -continue Effexor 225 mg daily   #Insomnia, improved - continue Restoril 15 mg to nightly PRN and Kharma Sampsel slept well without it last night.   #Poor appetite -Ensure TID  #Labs completed recently  #Disposition -discharge with family -follow up with [facility]    Treavon Castilleja, MD 07/04/2018, 1:34 PM

## 2018-07-05 MED ORDER — VENLAFAXINE HCL ER 75 MG PO CP24
300.0000 mg | ORAL_CAPSULE | Freq: Every day | ORAL | Status: DC
  Administered 2018-07-06 – 2018-07-11 (×6): 300 mg via ORAL
  Filled 2018-07-05 (×6): qty 4

## 2018-07-05 NOTE — Progress Notes (Signed)
[facility] MD Progress Note  07/05/2018 11:22 AM ODESSA MORREN  MRN:  [id number]  Subjective:    Mr. Detloff reports much improvement. He is no longer suicidal. He no longer wants to discuss ECT. He reports good mood and tolerates medications well. He denies any problems whatsoever. He however still appears depressed. There is poverty of speech and thought. He is in bed with curtains drawn not interacting much.  Spoke with his wife. The family visited over the weekend and found him far from baseline, unable to engage, with difficulties comprehending  And follow the conversation. They are worried and are asking about "long term facility". Spoke to wife about ECT. She will talk to the family. The patient mentioned "a procedure" we discussed but was unable to provide any details.   Principal Problem: Bipolar I disorder, current or most recent episode depressed, with psychotic features [facility]) Diagnosis:   Patient Active Problem List   Diagnosis Date Noted  . Bipolar I disorder, current or most recent episode depressed, with psychotic features ([place]) [F31.5] 06/28/2018    Priority: High  . Bipolar I disorder, current or most recent episode manic, with psychotic features ([place]) [F31.2]     Priority: High  . Suicidal ideation [R45.851] 06/28/2018   Total Time spent with patient: 20 minutes  Past Psychiatric History: bipolar disorder  Past Medical History:  Past Medical History:  Diagnosis Date  . Autism   . Autism    History reviewed. No pertinent surgical history. Family History: History reviewed. No pertinent family history. Family Psychiatric  History: none Social History:  Social History   Substance and Sexual Activity  Alcohol Use Never  . Frequency: Never     Social History   Substance and Sexual Activity  Drug Use Never    Social History   Socioeconomic History  . Marital status: Married    Spouse name: Not on file  . Number of children: Not on file  . Years of  education: Not on file  . Highest education level: Not on file  Occupational History  . Not on file  Social Needs  . Financial resource strain: Not on file  . Food insecurity:    Worry: Not on file    Inability: Not on file  . Transportation needs:    Medical: Not on file    Non-medical: Not on file  Tobacco Use  . Smoking status: Never Smoker  . Smokeless tobacco: Never Used  Substance and Sexual Activity  . Alcohol use: Never    Frequency: Never  . Drug use: Never  . Sexual activity: Yes  Lifestyle  . Physical activity:    Days per week: Not on file    Minutes per session: Not on file  . Stress: Not on file  Relationships  . Social connections:    Talks on phone: Not on file    Gets together: Not on file    Attends religious service: Not on file    Active member of club or organization: Not on file    Attends meetings of clubs or organizations: Not on file    Relationship status: Not on file  Other Topics Concern  . Not on file  Social History Narrative  . Not on file   Additional Social History:                         Sleep: Good  Appetite:  Good  Current Medications: Current Facility-Administered Medications  Medication Dose Route Frequency Provider Last Rate Last Dose  . acetaminophen (TYLENOL) tablet 650 mg  650 mg Oral Q6H PRN Clapacs, John T, MD      . alum & mag hydroxide-simeth (MAALOX/MYLANTA) 200-200-20 MG/5ML suspension 30 mL  30 mL Oral Q4H PRN Clapacs, John T, MD      . ARIPiprazole (ABILIFY) tablet 20 mg  20 mg Oral Daily Clapacs, Madie Reno, MD   20 mg at 07/05/18 0805  . divalproex (DEPAKOTE) DR tablet 1,000 mg  1,000 mg Oral QHS Clapacs, Madie Reno, MD   1,000 mg at 07/04/18 2122  . divalproex (DEPAKOTE) DR tablet 250 mg  250 mg Oral Daily Clapacs, Madie Reno, MD   250 mg at 07/05/18 0805  . feeding supplement (ENSURE ENLIVE) (ENSURE ENLIVE) liquid 237 mL  237 mL Oral TID BM Adalynne Steffensmeier B, MD   237 mL at 07/03/18 2231  . hydrOXYzine  (ATARAX/VISTARIL) tablet 50 mg  50 mg Oral TID PRN Clapacs, Madie Reno, MD      . magnesium hydroxide (MILK OF MAGNESIA) suspension 30 mL  30 mL Oral Daily PRN Clapacs, John T, MD      . temazepam (RESTORIL) capsule 15 mg  15 mg Oral QHS PRN He, Jun, MD      . venlafaxine XR (EFFEXOR-XR) 24 hr capsule 225 mg  225 mg Oral Q breakfast Jadine Brumley B, MD   225 mg at 07/05/18 0805    Lab Results: No results found for this or any previous visit (from the past 64 hour(s)).  Blood Alcohol level:  Lab Results  Component Value Date   ETH <10 06/28/2018   ETH <10 66/44/0347    Metabolic Disorder Labs: Lab Results  Component Value Date   HGBA1C 5.5 05/09/2018   MPG 111.15 05/09/2018   No results found for: PROLACTIN Lab Results  Component Value Date   CHOL 145 05/09/2018   TRIG 104 05/09/2018   HDL 35 (L) 05/09/2018   CHOLHDL 4.1 05/09/2018   VLDL 21 05/09/2018   LDLCALC 89 05/09/2018    Physical Findings: AIMS:  , ,  ,  ,    CIWA:    COWS:     Musculoskeletal: Strength & Muscle Tone: within normal limits Gait & Station: normal Patient leans: N/A  Psychiatric Specialty Exam: Physical Exam  Nursing note and vitals reviewed. Psychiatric: His speech is normal. Judgment and thought content normal. His affect is blunt. He is slowed and withdrawn. Cognition and memory are impaired. He exhibits a depressed mood.    Review of Systems  Neurological: Negative.   Psychiatric/Behavioral: Positive for depression.  All other systems reviewed and are negative.   Blood pressure 114/82, pulse 75, temperature 97.7 F (36.5 C), temperature source Oral, resp. rate 18, height 6' 2"  (1.88 m), weight 77.6 kg (171 lb), SpO2 99 %.Body mass index is 21.96 kg/m.  General Appearance: Casual  Eye Contact:  Good  Speech:  Slow  Volume:  Decreased  Mood:  Depressed  Affect:  Appropriate and Flat  Thought Process:  Linear and Descriptions of Associations: Tangential  Orientation:  Full (Time,  Place, and Person)  Thought Content:  WDL  Suicidal Thoughts:  No  Homicidal Thoughts:  No  Memory:  Immediate;   Poor Recent;   Poor Remote;   Poor  Judgement:  Fair  Insight:  Shallow  Psychomotor Activity:  Decreased  Concentration:  Concentration: Poor and Attention Span: Poor  Recall:  Poor  Fund of Knowledge:  Poor  Language:  Fair  Akathisia:  No  Handed:  Right  AIMS (if indicated):     Assets:  Communication Skills Desire for Improvement Financial Resources/Insurance Housing Intimacy Physical Health Resilience Social Support Transportation  ADL's:  Intact  Cognition:  WNL  Sleep:  Number of Hours: 7     Treatment Plan Summary: Daily contact with patient to assess and evaluate symptoms and progress in treatment and Medication management   Mr. Loh is a 47 year old male with a history of bipolar disorder, hospitalized here no long ago for a manic episode, returns to the hospital depressed and suicidal. He has not improved much in one week. Started discussion about ECT.  #Suicidal ideation, passing thoughts of suicide -patient able to contract to safety in the hospital  #Mood, depressed, improving.  -continue Abilify 20 mg daily -continue Depakote 250 mg in AM, 1000 mg in HS -increase Effexor to 300 mg daily  -ECT consult   #Insomnia, improved - continue Restoril 15 mg to nightly PRN  #Poor appetite -Ensure TID  #Labs completed recently  #Disposition -discharge with family -follow up with [facility]    Orson Slick, MD 07/05/2018, 11:22 AM

## 2018-07-05 NOTE — Progress Notes (Signed)
Recreation Therapy Notes  Date: 07/05/2018  Time: 9:30 am  Location: Craft Room  Behavioral response: Appropriate    Intervention Topic: Self-esteem  Discussion/Intervention:  Group content today was focused on self-esteem. Patient defined self-esteem and where it comes form. The group described reasons self-esteem is important. Individuals stated things that impact self-esteem and positive ways to improve self-esteem. The group participated in the intervention "Collage of Me" where patients were able to create a collage of positive things that makes them who they are. Clinical Observations/Feedback:  Patient came to group and was focused on what peers and staff had to say about self-esteem. He participated in the intervention and was social with peers. Pansey Pinheiro LRT/CTRS         Nalla Purdy 07/05/2018 12:23 PM

## 2018-07-05 NOTE — Progress Notes (Addendum)
[facility] MD Progress Note  07/06/2018 1:59 PM Steven Knight  MRN:  [id number]  Subjective:    Steven Knight has been secluded to his room. He only gets out for meals and medications. He still feels depressed and has passing thoughts of suicide but no plan or intention. His wife visited last night nad the visit went well but the patient does not think she was satisfied with his progress. He tolerates high dose of Effexor well. Sleeps at night. Ate full lunch today with [employer]. No somatic complaints.   Principal Problem: Bipolar I disorder, current or most recent episode depressed, with psychotic features [facility]) Diagnosis:   Patient Active Problem List   Diagnosis Date Noted  . Bipolar I disorder, current or most recent episode depressed, with psychotic features ([place]) [F31.5] 06/28/2018    Priority: High  . Bipolar I disorder, current or most recent episode manic, with psychotic features ([place]) [F31.2]     Priority: High  . Suicidal ideation [R45.851] 06/28/2018   Total Time spent with patient: 20 minutes  Past Psychiatric History: bipolar disorder  Past Medical History:  Past Medical History:  Diagnosis Date  . Autism   . Autism    History reviewed. No pertinent surgical history. Family History: History reviewed. No pertinent family history. Family Psychiatric  History: none Social History:  Social History   Substance and Sexual Activity  Alcohol Use Never  . Frequency: Never     Social History   Substance and Sexual Activity  Drug Use Never    Social History   Socioeconomic History  . Marital status: Married    Spouse name: Not on file  . Number of children: Not on file  . Years of education: Not on file  . Highest education level: Not on file  Occupational History  . Not on file  Social Needs  . Financial resource strain: Not on file  . Food insecurity:    Worry: Not on file    Inability: Not on file  . Transportation needs:    Medical: Not on file     Non-medical: Not on file  Tobacco Use  . Smoking status: Never Smoker  . Smokeless tobacco: Never Used  Substance and Sexual Activity  . Alcohol use: Never    Frequency: Never  . Drug use: Never  . Sexual activity: Yes  Lifestyle  . Physical activity:    Days per week: Not on file    Minutes per session: Not on file  . Stress: Not on file  Relationships  . Social connections:    Talks on phone: Not on file    Gets together: Not on file    Attends religious service: Not on file    Active member of club or organization: Not on file    Attends meetings of clubs or organizations: Not on file    Relationship status: Not on file  Other Topics Concern  . Not on file  Social History Narrative  . Not on file   Additional Social History:                         Sleep: Fair  Appetite:  Poor  Current Medications: Current Facility-Administered Medications  Medication Dose Route Frequency Provider Last Rate Last Dose  . acetaminophen (TYLENOL) tablet 650 mg  650 mg Oral Q6H PRN Clapacs, John T, MD      . alum & mag hydroxide-simeth (MAALOX/MYLANTA) 200-200-20 MG/5ML suspension 30 mL  30 mL Oral Q4H PRN Clapacs, John T, MD      . ARIPiprazole (ABILIFY) tablet 20 mg  20 mg Oral Daily Clapacs, Madie Reno, MD   20 mg at 07/06/18 0818  . divalproex (DEPAKOTE) DR tablet 1,000 mg  1,000 mg Oral QHS Clapacs, Madie Reno, MD   1,000 mg at 07/05/18 2104  . divalproex (DEPAKOTE) DR tablet 250 mg  250 mg Oral Daily Clapacs, Madie Reno, MD   250 mg at 07/06/18 0817  . feeding supplement (ENSURE ENLIVE) (ENSURE ENLIVE) liquid 237 mL  237 mL Oral TID BM Sanyla Summey B, MD   237 mL at 07/03/18 2231  . hydrOXYzine (ATARAX/VISTARIL) tablet 50 mg  50 mg Oral TID PRN Clapacs, Madie Reno, MD      . magnesium hydroxide (MILK OF MAGNESIA) suspension 30 mL  30 mL Oral Daily PRN Clapacs, John T, MD      . temazepam (RESTORIL) capsule 15 mg  15 mg Oral QHS PRN He, Jun, MD      . venlafaxine XR (EFFEXOR-XR) 24  hr capsule 300 mg  300 mg Oral Q breakfast Federica Allport B, MD   300 mg at 07/06/18 5643    Lab Results: No results found for this or any previous visit (from the past 48 hour(s)).  Blood Alcohol level:  Lab Results  Component Value Date   ETH <10 06/28/2018   ETH <10 32/95/1884    Metabolic Disorder Labs: Lab Results  Component Value Date   HGBA1C 5.5 05/09/2018   MPG 111.15 05/09/2018   No results found for: PROLACTIN Lab Results  Component Value Date   CHOL 145 05/09/2018   TRIG 104 05/09/2018   HDL 35 (L) 05/09/2018   CHOLHDL 4.1 05/09/2018   VLDL 21 05/09/2018   LDLCALC 89 05/09/2018    Physical Findings: AIMS: Facial and Oral Movements Muscles of Facial Expression: None, normal Lips and Perioral Area: None, normal Jaw: None, normal Tongue: None, normal,Extremity Movements Upper (arms, wrists, hands, fingers): None, normal Lower (legs, knees, ankles, toes): None, normal, Trunk Movements Neck, shoulders, hips: None, normal, Overall Severity Severity of abnormal movements (highest score from questions above): None, normal Incapacitation due to abnormal movements: None, normal Patient's awareness of abnormal movements (rate only patient's report): No Awareness, Dental Status Current problems with teeth and/or dentures?: No Does patient usually wear dentures?: No  CIWA:    COWS:     Musculoskeletal: Strength & Muscle Tone: within normal limits Gait & Station: normal Patient leans: N/A  Psychiatric Specialty Exam: Physical Exam  Nursing note and vitals reviewed. Psychiatric: His affect is blunt. His speech is delayed. He is slowed and withdrawn. Thought content is delusional. Cognition and memory are impaired. He expresses inappropriate judgment. He exhibits a depressed mood.    Review of Systems  Neurological: Negative.   Psychiatric/Behavioral: Positive for depression.  All other systems reviewed and are negative.   Blood pressure 107/85, pulse 84,  temperature 97.8 F (36.6 C), temperature source Oral, resp. rate 18, height 6' 2"  (1.88 m), weight 77.6 kg (171 lb), SpO2 100 %.Body mass index is 21.96 kg/m.  General Appearance: Casual  Eye Contact:  Good  Speech:  Blocked and Slow  Volume:  Decreased  Mood:  Depressed  Affect:  Flat  Thought Process:  Disorganized and Descriptions of Associations: Loose  Orientation:  Full (Time, Place, and Person)  Thought Content:  Illogical, Delusions and Paranoid Ideation  Suicidal Thoughts:  Yes.  without intent/plan  Homicidal  Thoughts:  No  Memory:  Immediate;   Poor Recent;   Poor Remote;   Poor  Judgement:  Impaired  Insight:  Lacking  Psychomotor Activity:  Psychomotor Retardation  Concentration:  Concentration: Poor and Attention Span: Poor  Recall:  Poor  Fund of Knowledge:  Fair  Language:  Poor  Akathisia:  No  Handed:  Right  AIMS (if indicated):     Assets:  Desire for Improvement Financial Resources/Insurance Housing Intimacy Physical Health Resilience Social Support Transportation  ADL's:  Intact  Cognition:  Impaired,  Mild  Sleep:  Number of Hours: 8.25     Treatment Plan Summary: Daily contact with patient to assess and evaluate symptoms and progress in treatment and Medication management   Steven Knight is a 47 year old male with a history of bipolar disorder, hospitalized here no long ago for a manic episode, returns to the hospital depressed and suicidal. He has not improved much in one week. Started discussion about ECT.  #Suicidal ideation, passing thoughts of suicide -patient able to contract to safety in the hospital  #Mood, depressed, improving.  -continue Abilify 20 mg daily -continue Depakote 250 mg in AM, 1000 mg in HS -increase Effexor to 300 mg daily  -start Remeron 15 mg nightly  -ECT consult   #Insomnia, improved -continueRestoril 15 mg to nightly PRN  #Poor appetite -Ensure TID  #Labs completed  recently  #Disposition -discharge with family -follow up with [facility]    Orson Slick, MD 07/06/2018, 1:59 PM

## 2018-07-05 NOTE — Plan of Care (Signed)
Patient's emotional status is improving.

## 2018-07-05 NOTE — Plan of Care (Signed)
  Problem: Education: Goal: Knowledge of [facility] Education information/materials will improve Outcome: Progressing Goal: Emotional status will improve Outcome: Progressing Goal: Mental status will improve Outcome: Progressing Goal: Verbalization of understanding the information provided will improve Outcome: Progressing   Problem: Activity: Goal: Interest or engagement in activities will improve Outcome: Progressing Goal: Sleeping patterns will improve Outcome: Progressing   Problem: Health Behavior/Discharge Planning: Goal: Identification of resources available to assist in meeting health care needs will improve Outcome: Progressing Goal: Compliance with treatment plan for underlying cause of condition will improve Outcome: Progressing   Problem: Coping: Goal: Coping ability will improve Outcome: Progressing Goal: Will verbalize feelings Outcome: Progressing   Problem: Role Relationship: Goal: Will demonstrate positive changes in social behaviors and relationships Outcome: Progressing   Problem: Safety: Goal: Ability to disclose and discuss suicidal ideas will improve Outcome: Progressing Goal: Ability to identify and utilize support systems that promote safety will improve Outcome: Progressing   Problem: Education: Goal: Knowledge of General Education information will improve Description Including pain rating scale, medication(s)/side effects and non-pharmacologic comfort measures Outcome: Progressing

## 2018-07-05 NOTE — Progress Notes (Signed)
Patient is alert and oriented to self and place. Polite with some thought blocking noted. Up in the milieu this morning upon my arrival to the unit. Compliant with medications and ate his meal in the dining room. Denies SI/HI/AVH and pain at this time. Reports last BM was on 07/04/18. Patient was encouraged to attend groups. When out in the milieu patient minimally interacts with his peers. Patient given support and encouragement.

## 2018-07-05 NOTE — Tx Team (Signed)
Interdisciplinary Treatment and Diagnostic Plan Update  07/05/2018 Time of Session: [place] MRN: [id number]  Principal Diagnosis: Bipolar I disorder, current or most recent episode depressed, with psychotic features ([place])  Secondary Diagnoses: Principal Problem:   Bipolar I disorder, current or most recent episode depressed, with psychotic features ([place]) Active Problems:   Suicidal ideation   Current Medications:  Current Facility-Administered Medications  Medication Dose Route Frequency Provider Last Rate Last Dose  . acetaminophen (TYLENOL) tablet 650 mg  650 mg Oral Q6H PRN Clapacs, John T, MD      . alum & mag hydroxide-simeth (MAALOX/MYLANTA) 200-200-20 MG/5ML suspension 30 mL  30 mL Oral Q4H PRN Clapacs, John T, MD      . ARIPiprazole (ABILIFY) tablet 20 mg  20 mg Oral Daily Clapacs, Madie Reno, MD   20 mg at 07/05/18 0805  . divalproex (DEPAKOTE) DR tablet 1,000 mg  1,000 mg Oral QHS Clapacs, Madie Reno, MD   1,000 mg at 07/04/18 2122  . divalproex (DEPAKOTE) DR tablet 250 mg  250 mg Oral Daily Clapacs, Madie Reno, MD   250 mg at 07/05/18 0805  . feeding supplement (ENSURE ENLIVE) (ENSURE ENLIVE) liquid 237 mL  237 mL Oral TID BM Pucilowska, Jolanta B, MD   237 mL at 07/03/18 2231  . hydrOXYzine (ATARAX/VISTARIL) tablet 50 mg  50 mg Oral TID PRN Clapacs, Madie Reno, MD      . magnesium hydroxide (MILK OF MAGNESIA) suspension 30 mL  30 mL Oral Daily PRN Clapacs, John T, MD      . temazepam (RESTORIL) capsule 15 mg  15 mg Oral QHS PRN He, Jun, MD      . venlafaxine XR (EFFEXOR-XR) 24 hr capsule 225 mg  225 mg Oral Q breakfast Pucilowska, Jolanta B, MD   225 mg at 07/05/18 0805   PTA Medications: Medications Prior to Admission  Medication Sig Dispense Refill Last Dose  . ARIPiprazole (ABILIFY) 20 MG tablet Take 1 tablet (20 mg total) by mouth daily. 30 tablet 0 06/28/2018 at 0700  . divalproex (DEPAKOTE) 250 MG DR tablet Take 1 tablet (250 mg total) by mouth daily with  breakfast. 30 tablet 0 06/28/2018 at 0700  . divalproex (DEPAKOTE) 500 MG DR tablet Take 2 tablets (1,000 mg total) by mouth at bedtime. 60 tablet 0 06/27/2018 at 2100  . traZODone (DESYREL) 50 MG tablet Take 1 tablet (50 mg total) by mouth at bedtime as needed for sleep. 30 tablet 0 06/27/2018 at 2100    Patient Stressors: Occupational concerns Traumatic event  Patient Strengths: Average or above average intelligence Communication skills Supportive family/friends  Treatment Modalities: Medication Management, Group therapy, Case management,  1 to 1 session with clinician, Psychoeducation, Recreational therapy.   Physician Treatment Plan for Primary Diagnosis: Bipolar I disorder, current or most recent episode depressed, with psychotic features ([place]) Long Term Goal(s): Improvement in symptoms so as ready for discharge NA   Short Term Goals: Ability to identify changes in lifestyle to reduce recurrence of condition will improve Ability to verbalize feelings will improve Ability to disclose and discuss suicidal ideas Ability to demonstrate self-control will improve Ability to identify and develop effective coping behaviors will improve Ability to maintain clinical measurements within normal limits will improve Ability to identify triggers associated with substance abuse/mental health issues will improve NA  Medication Management: Evaluate patient's response, side effects, and tolerance of medication regimen.  Therapeutic Interventions: 1 to 1 sessions, Unit Group sessions and Medication administration.  Evaluation  of Outcomes: Progressing  Physician Treatment Plan for Secondary Diagnosis: Principal Problem:   Bipolar I disorder, current or most recent episode depressed, with psychotic features ([place]) Active Problems:   Suicidal ideation  Long Term Goal(s): Improvement in symptoms so as ready for discharge NA   Short Term Goals: Ability to identify changes in lifestyle to reduce  recurrence of condition will improve Ability to verbalize feelings will improve Ability to disclose and discuss suicidal ideas Ability to demonstrate self-control will improve Ability to identify and develop effective coping behaviors will improve Ability to maintain clinical measurements within normal limits will improve Ability to identify triggers associated with substance abuse/mental health issues will improve NA     Medication Management: Evaluate patient's response, side effects, and tolerance of medication regimen.  Therapeutic Interventions: 1 to 1 sessions, Unit Group sessions and Medication administration.  Evaluation of Outcomes: Progressing   RN Treatment Plan for Primary Diagnosis: Bipolar I disorder, current or most recent episode depressed, with psychotic features ([place]) Long Term Goal(s): Knowledge of disease and therapeutic regimen to maintain health will improve  Short Term Goals: Ability to participate in decision making will improve, Ability to identify and develop effective coping behaviors will improve and Compliance with prescribed medications will improve  Medication Management: RN will administer medications as ordered by provider, will assess and evaluate patient's response and provide education to patient for prescribed medication. RN will report any adverse and/or side effects to prescribing provider.  Therapeutic Interventions: 1 on 1 counseling sessions, Psychoeducation, Medication administration, Evaluate responses to treatment, Monitor vital signs and CBGs as ordered, Perform/monitor CIWA, COWS, AIMS and Fall Risk screenings as ordered, Perform wound care treatments as ordered.  Evaluation of Outcomes: Progressing   LCSW Treatment Plan for Primary Diagnosis: Bipolar I disorder, current or most recent episode depressed, with psychotic features ([place]) Long Term Goal(s): Safe transition to appropriate next level of care at discharge, Engage patient in  therapeutic group addressing interpersonal concerns.  Short Term Goals: Engage patient in aftercare planning with referrals and resources, Increase emotional regulation and Increase skills for wellness and recovery  Therapeutic Interventions: Assess for all discharge needs, 1 to 1 time with Social worker, Explore available resources and support systems, Assess for adequacy in community support network, Educate family and significant other(s) on suicide prevention, Complete Psychosocial Assessment, Interpersonal group therapy.  Evaluation of Outcomes: Progressing   Progress in Treatment: Attending groups: Yes. Participating in groups: Yes. Taking medication as prescribed: Yes. Toleration medication: Yes. Family/Significant other contact made: Yes, individual(s) contacted:  Worthy Flank, patients wife Patient understands diagnosis: Yes. Discussing patient identified problems/goals with staff: Yes. Medical problems stabilized or resolved: Yes. Denies suicidal/homicidal ideation: Yes. Issues/concerns per patient self-inventory: No. Other: None at this time  New problem(s) identified: No, Describe:  None at this time.  New Short Term/Long Term Goal(s): Pt will demonstrate an improved mood by 48 hours prior to discharge, and will report 0 SI for at least 48 consecutive hours prior to discharge.   Patient Goals:  Pt reported his goal for treatment is, "to get better."   Discharge Plan or Barriers: Pt will be discharged home and will continue tx in the outpatient setting with [facility].   Reason for Continuation of Hospitalization: Depression Medication stabilization  Estimated Length of Stay: 3-5 days    Attendees: Patient:  07/05/2018 11:16 AM  Physician: Dr. Bary Leriche, MD 07/05/2018 11:16 AM  Nursing: Eugenio Hoes, RN 07/05/2018 11:16 AM  RN Care Manager: 07/05/2018 11:16 AM  Social  Worker:  Darin Engels, [place] 07/05/2018 11:16 AM  Recreational Therapist: Roanna Epley, CTRS-LRT  07/05/2018 11:16 AM  Other: Dossie Arbour, LCSW 07/05/2018 11:16 AM  Other:  07/05/2018 11:16 AM  Other:   07/05/2018 11:16 AM    Scribe for Treatment Team: Darin Engels, LCSW 07/05/2018 11:16 AM

## 2018-07-05 NOTE — Progress Notes (Signed)
Nursing note 7p-7a  Pt observed isolating in his room majority of this shift, he did come out for med pass and snack. Displayed a flat affect and anxious mood upon interaction with this Probation officer. Pt denies pain ,denies SI/HI, and also denies any audio or visual hallucinations at this time. Pt refused nutritional supplement at HS stated that he doesn't need it now.Pt is able to verbally contract for safety with this RN.Pt is now resting in bed with eyes closed, with no signs or symptoms of pain or distress noted. Pt continues to remain safe on the unit and is observed by rounding every 15 min. RN will continue to monitor.

## 2018-07-06 NOTE — [facility] Group Notes (Signed)
07/06/2018 1PM  Type of Therapy/Topic:  Group Therapy:  Feelings about Diagnosis  Participation Level:  Did Not Attend   Description of Group:   This group will allow patients to explore their thoughts and feelings about diagnoses they have received. Patients will be guided to explore their level of understanding and acceptance of these diagnoses. Facilitator will encourage patients to process their thoughts and feelings about the reactions of others to their diagnosis and will guide patients in identifying ways to discuss their diagnosis with significant others in their lives. This group will be process-oriented, with patients participating in exploration of their own experiences, giving and receiving support, and processing challenge from other group members.   Therapeutic Goals: 1. Patient will demonstrate understanding of diagnosis as evidenced by identifying two or more symptoms of the disorder 2. Patient will be able to express two feelings regarding the diagnosis 3. Patient will demonstrate their ability to communicate their needs through discussion and/or role play  Summary of Patient Progress: Patient was encouraged and invited to attend group. Patient did not attend group. Social worker will continue to encourage group participation in the future.        Therapeutic Modalities:   Cognitive Behavioral Therapy Brief Therapy Feelings Identification    Darin Engels, [place] 07/06/2018 4:28 PM

## 2018-07-06 NOTE — Progress Notes (Signed)
Recreation Therapy Notes          Steven Knight 07/06/2018 2:33 PM

## 2018-07-06 NOTE — Consult Note (Signed)
ECT: Patient was referred for consideration of ECT.  Met with patient.  Chart reviewed.  Patient has a diagnosis of bipolar disorder and a past diagnosis of autistic spectrum disorder.  Recent attempts at discharge resulted in further decompensation and he is continuing to endorse depressed mood.  On interview tonight the patient says he thinks his mood is slightly better than when he came into the hospital.  Denies any immediate suicidal thoughts.  He is taking better care of his hygiene.  Still has a blunted flat affect and paucity of speech.  Denies any hallucinations.  Patient has a somewhat unclear diagnosis but does carry a diagnosis of depressed bipolar disorder.  Would potentially be a candidate for ECT based on this.  He feels like his symptoms are getting slightly better.  I explained the procedure and gave the patient opportunity to ask questions.  At this time he is expressing hesitancy to commit to the treatment and would prefer discussing it with his family and thinking about it further.  Because of scheduling it is unlikely that we will be able to do enough treatments to get clinical improvement under the circumstances right now.  If the patient is still in the hospital we will continue to follow up and reconsider as needed.  I will speak to him again tomorrow and the next day to see how he is doing.  It is still not out of the question that we could do treatment Friday but he is leaning against it.

## 2018-07-06 NOTE — Plan of Care (Signed)
Patient is alert and oriented to self and place. Polite with some thought blocking noted. Up in the milieu this morning upon my arrival to the unit. Compliant with medications and ate his meal in the dining room. Denies SI/HI/AVH and pain at this time. Reports last BM was on 07/05/18. Patient was encouraged to attend groups. When out in the milieu patient minimally interacts with his peers. Patient given support and encouragement. Will continue to monitor.

## 2018-07-07 NOTE — Progress Notes (Addendum)
[facility] MD Progress Note  07/08/2018 4:51 PM DAVIS AMBROSINI  MRN:  [id number]  Subjective:   Mr. Zaring has not been making much progress. Still depressed, flat, with psychomotor retardation but no syuicidal. Claims to feel better and wants to go home. Still difficulties with engagement, comprehension, decision-making. Accepts medications and tolerates them well. Not wanting to consider ECT.  Spoke with the wife. She is concerned. Will visit today.   Principal Problem: Bipolar I disorder, current or most recent episode depressed, with psychotic features [facility]) Diagnosis:   Patient Active Problem List   Diagnosis Date Noted  . Bipolar I disorder, current or most recent episode depressed, with psychotic features ([place]) [F31.5] 06/28/2018    Priority: High  . Bipolar I disorder, current or most recent episode manic, with psychotic features ([place]) [F31.2]     Priority: High  . Suicidal ideation [R45.851] 06/28/2018   Total Time spent with patient: 20 minutes  Past Psychiatric History: bipolar  Past Medical History:  Past Medical History:  Diagnosis Date  . Autism   . Autism    History reviewed. No pertinent surgical history. Family History: History reviewed. No pertinent family history. Family Psychiatric  History: none Social History:  Social History   Substance and Sexual Activity  Alcohol Use Never  . Frequency: Never     Social History   Substance and Sexual Activity  Drug Use Never    Social History   Socioeconomic History  . Marital status: Married    Spouse name: Not on file  . Number of children: Not on file  . Years of education: Not on file  . Highest education level: Not on file  Occupational History  . Not on file  Social Needs  . Financial resource strain: Not on file  . Food insecurity:    Worry: Not on file    Inability: Not on file  . Transportation needs:    Medical: Not on file    Non-medical: Not on file  Tobacco Use  . Smoking status:  Never Smoker  . Smokeless tobacco: Never Used  Substance and Sexual Activity  . Alcohol use: Never    Frequency: Never  . Drug use: Never  . Sexual activity: Yes  Lifestyle  . Physical activity:    Days per week: Not on file    Minutes per session: Not on file  . Stress: Not on file  Relationships  . Social connections:    Talks on phone: Not on file    Gets together: Not on file    Attends religious service: Not on file    Active member of club or organization: Not on file    Attends meetings of clubs or organizations: Not on file    Relationship status: Not on file  Other Topics Concern  . Not on file  Social History Narrative  . Not on file   Additional Social History:                         Sleep: Poor  Appetite:  Fair  Current Medications: Current Facility-Administered Medications  Medication Dose Route Frequency Provider Last Rate Last Dose  . acetaminophen (TYLENOL) tablet 650 mg  650 mg Oral Q6H PRN Clapacs, John T, MD      . alum & mag hydroxide-simeth (MAALOX/MYLANTA) 200-200-20 MG/5ML suspension 30 mL  30 mL Oral Q4H PRN Clapacs, John T, MD      . ARIPiprazole (ABILIFY) tablet 20 mg  20 mg Oral Daily Clapacs, Madie Reno, MD   20 mg at 07/08/18 0747  . divalproex (DEPAKOTE) DR tablet 1,000 mg  1,000 mg Oral QHS Clapacs, John T, MD   1,000 mg at 07/07/18 2121  . divalproex (DEPAKOTE) DR tablet 250 mg  250 mg Oral Daily Clapacs, Madie Reno, MD   250 mg at 07/08/18 0748  . feeding supplement (ENSURE ENLIVE) (ENSURE ENLIVE) liquid 237 mL  237 mL Oral TID BM Reginald Weida B, MD   237 mL at 07/07/18 2119  . hydrOXYzine (ATARAX/VISTARIL) tablet 50 mg  50 mg Oral TID PRN Clapacs, Madie Reno, MD      . magnesium hydroxide (MILK OF MAGNESIA) suspension 30 mL  30 mL Oral Daily PRN Clapacs, John T, MD      . mirtazapine (REMERON) tablet 15 mg  15 mg Oral QHS Chapman Matteucci B, MD      . temazepam (RESTORIL) capsule 15 mg  15 mg Oral QHS PRN He, Jun, MD      .  venlafaxine XR (EFFEXOR-XR) 24 hr capsule 300 mg  300 mg Oral Q breakfast Cohan Stipes B, MD   300 mg at 07/08/18 0747    Lab Results: No results found for this or any previous visit (from the past 48 hour(s)).  Blood Alcohol level:  Lab Results  Component Value Date   ETH <10 06/28/2018   ETH <10 02/63/7858    Metabolic Disorder Labs: Lab Results  Component Value Date   HGBA1C 5.5 05/09/2018   MPG 111.15 05/09/2018   No results found for: PROLACTIN Lab Results  Component Value Date   CHOL 145 05/09/2018   TRIG 104 05/09/2018   HDL 35 (L) 05/09/2018   CHOLHDL 4.1 05/09/2018   VLDL 21 05/09/2018   LDLCALC 89 05/09/2018    Physical Findings: AIMS: Facial and Oral Movements Muscles of Facial Expression: None, normal Lips and Perioral Area: None, normal Jaw: None, normal Tongue: None, normal,Extremity Movements Upper (arms, wrists, hands, fingers): None, normal Lower (legs, knees, ankles, toes): None, normal, Trunk Movements Neck, shoulders, hips: None, normal, Overall Severity Severity of abnormal movements (highest score from questions above): None, normal Incapacitation due to abnormal movements: None, normal Patient's awareness of abnormal movements (rate only patient's report): No Awareness, Dental Status Current problems with teeth and/or dentures?: No Does patient usually wear dentures?: No  CIWA:    COWS:     Musculoskeletal: Strength & Muscle Tone: within normal limits Gait & Station: normal Patient leans: N/A  Psychiatric Specialty Exam: Physical Exam  Nursing note and vitals reviewed. Psychiatric: Thought content normal. His affect is blunt. His speech is delayed. He is slowed and withdrawn. Cognition and memory are impaired. He expresses impulsivity.    Review of Systems  Neurological: Negative.   Psychiatric/Behavioral: Positive for depression. The patient has insomnia.   All other systems reviewed and are negative.   Blood pressure 116/84,  pulse 94, temperature 97.6 F (36.4 C), temperature source Oral, resp. rate 16, height 6' 2"  (1.88 m), weight 77.6 kg (171 lb), SpO2 99 %.Body mass index is 21.96 kg/m.  General Appearance: Casual  Eye Contact:  Good  Speech:  Slow  Volume:  Decreased  Mood:  Depressed  Affect:  Flat  Thought Process:  Disorganized and Descriptions of Associations: Tangential  Orientation:  Full (Time, Place, and Person)  Thought Content:  WDL  Suicidal Thoughts:  No  Homicidal Thoughts:  No  Memory:  Immediate;   Poor Recent;  Poor Remote;   Poor  Judgement:  Impaired  Insight:  Shallow  Psychomotor Activity:  Psychomotor Retardation  Concentration:  Concentration: Fair and Attention Span: Poor  Recall:  Poor  Fund of Knowledge:  Fair  Language:  Poor  Akathisia:  No  Handed:  Right  AIMS (if indicated):     Assets:  Communication Skills Desire for Improvement Financial Resources/Insurance Housing Intimacy Physical Health Resilience Social Support  ADL's:  Intact  Cognition:  WNL  Sleep:  Number of Hours: 8     Treatment Plan Summary: Daily contact with patient to assess and evaluate symptoms and progress in treatment and Medication management   Mr. Hippler is a 47 year old male with a history of bipolar disorder, hospitalized here no long ago for a manic episode, returns to the hospital depressed and suicidal.He has not improved much in one week. Started discussion about ECT.  #Suicidal ideation, passing thoughts of suicide -patient able to contract to safety in the hospital  #Mood,depressed, improving.  -continue Abilify 20 mg daily -continueDepakote 250 mg in AM, 1000 mg in HS -continue Effexor to 300 mg daily -continue Remeron 15 mg nightly -ECT consultis appreciated -patient not interested at present  #Insomnia, improved -continueRestoril 15 mg to nightly PRN  #Poor appetite -Ensure TID  #Labs completed recently  #Disposition -discharge with  family -follow up with [facility]    Orson Slick, MD 07/08/2018, 4:51 PM

## 2018-07-07 NOTE — [facility] Group Notes (Signed)
[place] Group Notes:  (Nursing/MHT/Case Management/Adjunct)  Date:  07/07/2018  Time:  9:47 PM  Type of Therapy:  Group Therapy  Participation Level:  Did Not Attend   Summary of Progress/Problems:  Steven Knight 07/07/2018, 9:47 PM

## 2018-07-07 NOTE — Progress Notes (Signed)
Received Steven Knight this AM after breakfast sitting in the day room. He was compliant with his medications. He denied all of the psychiatric symptoms. His affect is worried with a  Puzzled look. He remained OOB in the milieu at intervals. No change in his status this PM.

## 2018-07-07 NOTE — Progress Notes (Signed)
Nutrition Brief Note  Patient identified on the Malnutrition Screening Tool (MST) Report  47 year old male with a history of bipolar disorder   Wt Readings from Last 15 Encounters:  06/29/18 171 lb (77.6 kg)  06/28/18 183 lb (83 kg)  05/08/18 168 lb (76.2 kg)  05/06/18 169 lb (76.7 kg)    Body mass index is 21.96 kg/m. Patient meets criteria for normal weight based on current BMI. Per chart, pt is weight stable.   Current diet order is regular, patient is consuming approximately 100% of meals at this time. Labs and medications reviewed.   Pt order for Ensure Enlive po TID, each supplement provides 350 kcal and 20 grams of protein  No nutrition interventions warranted at this time. If nutrition issues arise, please consult RD.   Koleen Distance MS, RD, LDN Pager #- [phone] Office#- [phone] After Hours Pager: [phone]

## 2018-07-07 NOTE — Progress Notes (Signed)
Recreation Therapy Notes   Date: 07/07/2018  Time: 9:30 pm   Location: Craft Room   Behavioral response: N/A   Intervention Topic: Stress  Discussion/Intervention: Patient did not attend group.   Clinical Observations/Feedback:  Patient did not attend group.   Rylee Huestis LRT/CTRS        Gustave Lindeman 07/07/2018 12:06 PM

## 2018-07-07 NOTE — Progress Notes (Signed)
[facility] MD Progress Note  07/07/2018 3:10 PM Steven Knight  MRN:  [id number]  Subjective:    Steven Knight claims to feel better but his presentation has not changed much. He is in bed with lights off and curtains drawn. He is quicker to answer my questions but is not really engaging in conversation. He attempted to attend group but was unable to complete it. Tolerates medications well. No somatic complaints.  Spoke with the wife who visited last night but believes that the patient is far from baseline.   Principal Problem: Bipolar I disorder, current or most recent episode depressed, with psychotic features [facility]) Diagnosis:   Patient Active Problem List   Diagnosis Date Noted  . Bipolar I disorder, current or most recent episode depressed, with psychotic features ([place]) [F31.5] 06/28/2018    Priority: High  . Bipolar I disorder, current or most recent episode manic, with psychotic features ([place]) [F31.2]     Priority: High  . Suicidal ideation [R45.851] 06/28/2018   Total Time spent with patient: 20 minutes  Past Psychiatric History: bipolar disorder  Past Medical History:  Past Medical History:  Diagnosis Date  . Autism   . Autism    History reviewed. No pertinent surgical history. Family History: History reviewed. No pertinent family history. Family Psychiatric  History: none Social History:  Social History   Substance and Sexual Activity  Alcohol Use Never  . Frequency: Never     Social History   Substance and Sexual Activity  Drug Use Never    Social History   Socioeconomic History  . Marital status: Married    Spouse name: Not on file  . Number of children: Not on file  . Years of education: Not on file  . Highest education level: Not on file  Occupational History  . Not on file  Social Needs  . Financial resource strain: Not on file  . Food insecurity:    Worry: Not on file    Inability: Not on file  . Transportation needs:    Medical: Not on file     Non-medical: Not on file  Tobacco Use  . Smoking status: Never Smoker  . Smokeless tobacco: Never Used  Substance and Sexual Activity  . Alcohol use: Never    Frequency: Never  . Drug use: Never  . Sexual activity: Yes  Lifestyle  . Physical activity:    Days per week: Not on file    Minutes per session: Not on file  . Stress: Not on file  Relationships  . Social connections:    Talks on phone: Not on file    Gets together: Not on file    Attends religious service: Not on file    Active member of club or organization: Not on file    Attends meetings of clubs or organizations: Not on file    Relationship status: Not on file  Other Topics Concern  . Not on file  Social History Narrative  . Not on file   Additional Social History:                         Sleep: Poor  Appetite:  Fair  Current Medications: Current Facility-Administered Medications  Medication Dose Route Frequency Provider Last Rate Last Dose  . acetaminophen (TYLENOL) tablet 650 mg  650 mg Oral Q6H PRN Clapacs, John T, MD      . alum & mag hydroxide-simeth (MAALOX/MYLANTA) 200-200-20 MG/5ML suspension 30 mL  30 mL Oral Q4H PRN Clapacs, John T, MD      . ARIPiprazole (ABILIFY) tablet 20 mg  20 mg Oral Daily Clapacs, Madie Reno, MD   20 mg at 07/07/18 8938  . divalproex (DEPAKOTE) DR tablet 1,000 mg  1,000 mg Oral QHS Clapacs, John T, MD   1,000 mg at 07/06/18 2110  . divalproex (DEPAKOTE) DR tablet 250 mg  250 mg Oral Daily Clapacs, Madie Reno, MD   250 mg at 07/07/18 1017  . feeding supplement (ENSURE ENLIVE) (ENSURE ENLIVE) liquid 237 mL  237 mL Oral TID BM Zvi Duplantis B, MD   237 mL at 07/06/18 2000  . hydrOXYzine (ATARAX/VISTARIL) tablet 50 mg  50 mg Oral TID PRN Clapacs, John T, MD      . magnesium hydroxide (MILK OF MAGNESIA) suspension 30 mL  30 mL Oral Daily PRN Clapacs, John T, MD      . temazepam (RESTORIL) capsule 15 mg  15 mg Oral QHS PRN He, Jun, MD      . venlafaxine XR (EFFEXOR-XR)  24 hr capsule 300 mg  300 mg Oral Q breakfast Fradel Baldonado B, MD   300 mg at 07/07/18 5102    Lab Results: No results found for this or any previous visit (from the past 48 hour(s)).  Blood Alcohol level:  Lab Results  Component Value Date   ETH <10 06/28/2018   ETH <10 58/52/7782    Metabolic Disorder Labs: Lab Results  Component Value Date   HGBA1C 5.5 05/09/2018   MPG 111.15 05/09/2018   No results found for: PROLACTIN Lab Results  Component Value Date   CHOL 145 05/09/2018   TRIG 104 05/09/2018   HDL 35 (L) 05/09/2018   CHOLHDL 4.1 05/09/2018   VLDL 21 05/09/2018   LDLCALC 89 05/09/2018    Physical Findings: AIMS: Facial and Oral Movements Muscles of Facial Expression: None, normal Lips and Perioral Area: None, normal Jaw: None, normal Tongue: None, normal,Extremity Movements Upper (arms, wrists, hands, fingers): None, normal Lower (legs, knees, ankles, toes): None, normal, Trunk Movements Neck, shoulders, hips: None, normal, Overall Severity Severity of abnormal movements (highest score from questions above): None, normal Incapacitation due to abnormal movements: None, normal Patient's awareness of abnormal movements (rate only patient's report): No Awareness, Dental Status Current problems with teeth and/or dentures?: No Does patient usually wear dentures?: No  CIWA:    COWS:     Musculoskeletal: Strength & Muscle Tone: within normal limits Gait & Station: normal Patient leans: N/A  Psychiatric Specialty Exam: Physical Exam  Nursing note and vitals reviewed. Psychiatric: Judgment normal. His affect is blunt. His speech is delayed. He is slowed. Thought content is paranoid. Cognition and memory are impaired.    Review of Systems  Neurological: Negative.   Psychiatric/Behavioral: Positive for depression. The patient has insomnia.   All other systems reviewed and are negative.   Blood pressure 127/82, pulse 90, temperature 98.5 F (36.9 C),  temperature source Oral, resp. rate 18, height 6' 2"  (1.88 m), weight 77.6 kg (171 lb), SpO2 100 %.Body mass index is 21.96 kg/m.  General Appearance: Casual  Eye Contact:  Good  Speech:  Slow  Volume:  Decreased  Mood:  Depressed  Affect:  Blunt  Thought Process:  Disorganized, Linear and Descriptions of Associations: Tangential  Orientation:  Full (Time, Place, and Person)  Thought Content:  Paranoid Ideation  Suicidal Thoughts:  No  Homicidal Thoughts:  No  Memory:  Immediate;   Poor Recent;  Poor Remote;   Poor  Judgement:  Impaired  Insight:  Shallow  Psychomotor Activity:  Psychomotor Retardation  Concentration:  Concentration: Poor and Attention Span: Poor  Recall:  Poor  Fund of Knowledge:  Poor  Language:  Poor  Akathisia:  No  Handed:  Right  AIMS (if indicated):     Assets:  Communication Skills Desire for Improvement Financial Resources/Insurance Housing Intimacy Social Support Talents/Skills Vocational/Educational  ADL's:  Intact  Cognition:  Impaired,  Mild  Sleep:  Number of Hours: 8.3     Treatment Plan Summary: Daily contact with patient to assess and evaluate symptoms and progress in treatment and Medication management   Steven Knight is a 47 year old male with a history of bipolar disorder, hospitalized here no long ago for a manic episode, returns to the hospital depressed and suicidal.He has not improved much in one week. Started discussion about ECT.  #Suicidal ideation, passing thoughts of suicide -patient able to contract to safety in the hospital  #Mood,depressed, improving.  -continue Abilify 20 mg daily -continueDepakote 250 mg in AM, 1000 mg in HS -continue Effexor to 300 mg daily  -continue Remeron 15 mg nightly  -ECT consultis appreciated -patient not interested at present  #Insomnia, improved -continueRestoril 15 mg to nightly PRN  #Poor appetite -Ensure TID  #Labs completed recently  #Disposition -discharge  with family -follow up with [facility]     Orson Slick, MD 07/07/2018, 3:10 PM

## 2018-07-08 MED ORDER — MIRTAZAPINE 15 MG PO TABS
15.0000 mg | ORAL_TABLET | Freq: Every day | ORAL | Status: DC
  Administered 2018-07-08 – 2018-07-10 (×3): 15 mg via ORAL
  Filled 2018-07-08 (×3): qty 1

## 2018-07-08 NOTE — Progress Notes (Signed)
Received Steven Knight this AM during breakfast and after group this AM. He was compliant with his medication. He is OOB in the milieu with his peers. During our one to one encounter he verbalized feeling better, back to his old self and feels safe to go home. This writers assessment of him is a rigid walk and movements along with a flat, nonengaged, and preoccupied  Affect. No change in his status this PM.

## 2018-07-08 NOTE — B[facility] Group Notes (Signed)
LCSW Group Therapy Note  07/08/2018 1:00pm  Type of Therapy/Topic:  Group Therapy:  Balance in Life  Participation Level:  Did Not Attend  Description of Group:    This group will address the concept of balance and how it feels and looks when one is unbalanced. Patients will be encouraged to process areas in their lives that are out of balance and identify reasons for remaining unbalanced. Facilitators will guide patients in utilizing problem-solving interventions to address and correct the stressor making their life unbalanced. Understanding and applying boundaries will be explored and addressed for obtaining and maintaining a balanced life. Patients will be encouraged to explore ways to assertively make their unbalanced needs known to significant others in their lives, using other group members and facilitator for support and feedback.  Therapeutic Goals: 1. Patient will identify two or more emotions or situations they have that consume much of in their lives. 2. Patient will identify signs/triggers that life has become out of balance:  3. Patient will identify two ways to set boundaries in order to achieve balance in their lives:  4. Patient will demonstrate ability to communicate their needs through discussion and/or role plays  Summary of Patient Progress:  Orvis was invited to group, but chose not to attend.    Therapeutic Modalities:   Cognitive Behavioral Therapy Solution-Focused Therapy Assertiveness Training  Devona Konig, [place] 07/08/2018 4:19 PM

## 2018-07-08 NOTE — Plan of Care (Signed)
Pt isolative to his room all evening. Pt denies SI/HI. Pt is receptive to treatment and safety maintained on unit. Patient compliant with medication. Problem: Education: Goal: Knowledge of [place] General Education information/materials will improve Outcome: Progressing Goal: Emotional status will improve Outcome: Progressing Goal: Mental status will improve Outcome: Progressing Goal: Verbalization of understanding the information provided will improve Outcome: Progressing   Problem: Activity: Goal: Interest or engagement in activities will improve Outcome: Progressing   Problem: Health Behavior/Discharge Planning: Goal: Identification of resources available to assist in meeting health care needs will improve Outcome: Progressing   Problem: Safety: Goal: Ability to disclose and discuss suicidal ideas will improve Outcome: Progressing Goal: Ability to identify and utilize support systems that promote safety will improve Outcome: Progressing

## 2018-07-08 NOTE — Progress Notes (Signed)
Recreation Therapy Notes   Date: 07/08/2018  Time: 9:30 am  Location: Craft Room  Behavioral response: Not engaged  Intervention Topic: Self-care  Discussion/Intervention:  Group content today was focused on Self-Care. The group defined self-care and some positive ways they care for themselves. Individuals expressed ways and reasons why they neglected any self-care in the past. Patients described ways to improve self-care in the future. The group explained what could happen if they did not do any self-care activities at all. The group participated in the intervention "self-care assessment" where they had a chance to discover some of their weaknesses and strengths in self- care. Patient came up with a self-care plan to improve themselves in the future.  Clinical Observations/Feedback:  Patient came to group but was not engaged with peers or staff. Individual had a blank stare the entire group. Camryn Quesinberry LRT/CTRS          Arturo Sofranko 07/08/2018 11:18 AM

## 2018-07-08 NOTE — Plan of Care (Signed)
Expressing readiness for discharge

## 2018-07-08 NOTE — B[facility] Group Notes (Signed)
LCSW Group Therapy Note 07/08/2018 9:00 AM  Type of Therapy and Topic:  Group Therapy:  Setting Goals  Participation Level:  Minimal  Description of Group: In this process group, patients discussed using strengths to work toward goals and address challenges.  Patients identified two positive things about themselves and one goal they were working on.  Patients were given the opportunity to share openly and support each other's plan for self-empowerment.  The group discussed the value of gratitude and were encouraged to have a daily reflection of positive characteristics or circumstances.  Patients were encouraged to identify a plan to utilize their strengths to work on current challenges and goals.  Therapeutic Goals 1. Patient will verbalize personal strengths/positive qualities and relate how these can assist with achieving desired personal goals 2. Patients will verbalize affirmation of peers plans for personal change and goal setting 3. Patients will explore the value of gratitude and positive focus as related to successful achievement of goals 4. Patients will verbalize a plan for regular reinforcement of personal positive qualities and circumstances.  Summary of Patient Progress: Terrence was able to participate some in today's group discussion on setting goals using the SMART Model.  Trexton shared that he had a good understanding of how he can using the SMART Model to establish his own goals.  Trenton shared with group that his only goal when in the [facility] is to "get home soon".  Carlton did not wish to provide details to group on things that he is doing to help him accomplish this goal.      Therapeutic Modalities [place], LCSW 07/08/2018 12:44 PM

## 2018-07-08 NOTE — Progress Notes (Signed)
Patient was in room at the beginning of the shift. Alert and oriented, calm and cooperative. Denying thoughts of self harm. Expressing readiness for discharge, reporting that he has good spousal support. Currently in group, cooperative. Staff continue to monitor.

## 2018-07-09 MED ORDER — MIRTAZAPINE 15 MG PO TABS: 15.0000 mg | ORAL_TABLET | Freq: Every day | ORAL | 1 refills | Status: DC

## 2018-07-09 MED ORDER — DIVALPROEX SODIUM 250 MG PO DR TAB: 250.0000 mg | DELAYED_RELEASE_TABLET | Freq: Every day | ORAL | 1 refills | Status: DC

## 2018-07-09 MED ORDER — ARIPIPRAZOLE 20 MG PO TABS: 20.0000 mg | ORAL_TABLET | Freq: Every day | ORAL | 1 refills | Status: DC

## 2018-07-09 MED ORDER — TRAZODONE HCL 50 MG PO TABS: 50.0000 mg | ORAL_TABLET | Freq: Every evening | ORAL | 1 refills | Status: DC | PRN

## 2018-07-09 MED ORDER — VENLAFAXINE HCL ER 150 MG PO CP24: 300.0000 mg | ORAL_CAPSULE | Freq: Every day | ORAL | 1 refills | Status: DC

## 2018-07-09 MED ORDER — TEMAZEPAM 7.5 MG PO CAPS: 7.5000 mg | ORAL_CAPSULE | Freq: Every evening | ORAL | Status: DC | PRN

## 2018-07-09 MED ORDER — DIVALPROEX SODIUM 500 MG PO DR TAB: 1000.0000 mg | DELAYED_RELEASE_TABLET | Freq: Every day | ORAL | 1 refills | Status: DC

## 2018-07-09 NOTE — Progress Notes (Signed)
D: Pt denies SI/HI/AVH. Pt is pleasant and cooperative. Pt. has no Complaints.  Patient Interaction minimal, but appropriate. Pt. Denies all psychiatric and physical symptoms, but presents flat and blunted often. Pt. Tends to after activities feel the urge to retreat back to his room, but with redirection and encouragement participates in more activities.    A: Q x 15 minute observation checks were completed for safety. Patient was provided with education.  Patient was given/offered medications per orders. Patient  was encourage to attend groups, participate in unit activities and continue with plan of care. Pt. Chart and plans of care reviewed. Pt. Given support and encouragement.   R: Patient is complaint with medication and unit procedures. Pt. Attends groups and unit activities, comes out and attends meals. Pt. More visible around milieu.             Precautionary checks every 15 minutes for safety maintained, room free of safety hazards, patient sustains no injury or falls during this shift. Will endorse care to next shift.

## 2018-07-09 NOTE — Plan of Care (Signed)
Pt. More visible around the milieu and attending meals. Pt. More active and present in groups and unit activities. Pt. Compliant with medications and unit procedures. Pt. More receptive to drinking ensures. Pt. Verbalizes understanding of provided education. Pt. Denies SI/HI. Pt verbally is able to contract for safety.    Problem: Activity: Goal: Interest or engagement in activities will improve Outcome: Progressing   Problem: Health Behavior/Discharge Planning: Goal: Compliance with treatment plan for underlying cause of condition will improve Outcome: Progressing   Problem: Safety: Goal: Ability to disclose and discuss suicidal ideas will improve Outcome: Progressing   Problem: Education: Goal: Knowledge of General Education information will improve Description Including pain rating scale, medication(s)/side effects and non-pharmacologic comfort measures Outcome: Progressing

## 2018-07-09 NOTE — Tx Team (Signed)
Interdisciplinary Treatment and Diagnostic Plan Update  07/09/2018 Time of Session: [place] MRN: [id number]  Principal Diagnosis: Bipolar I disorder, current or most recent episode depressed, with psychotic features ([place])  Secondary Diagnoses: Principal Problem:   Bipolar I disorder, current or most recent episode depressed, with psychotic features ([place]) Active Problems:   Suicidal ideation   Current Medications:  Current Facility-Administered Medications  Medication Dose Route Frequency Provider Last Rate Last Dose  . acetaminophen (TYLENOL) tablet 650 mg  650 mg Oral Q6H PRN Clapacs, John T, MD      . alum & mag hydroxide-simeth (MAALOX/MYLANTA) 200-200-20 MG/5ML suspension 30 mL  30 mL Oral Q4H PRN Clapacs, John T, MD      . ARIPiprazole (ABILIFY) tablet 20 mg  20 mg Oral Daily Clapacs, Madie Reno, MD   20 mg at 07/09/18 0755  . divalproex (DEPAKOTE) DR tablet 1,000 mg  1,000 mg Oral QHS Clapacs, John T, MD   1,000 mg at 07/08/18 2130  . divalproex (DEPAKOTE) DR tablet 250 mg  250 mg Oral Daily Clapacs, Madie Reno, MD   250 mg at 07/09/18 0755  . feeding supplement (ENSURE ENLIVE) (ENSURE ENLIVE) liquid 237 mL  237 mL Oral TID BM Pucilowska, Jolanta B, MD   237 mL at 07/08/18 2011  . hydrOXYzine (ATARAX/VISTARIL) tablet 50 mg  50 mg Oral TID PRN Clapacs, John T, MD      . magnesium hydroxide (MILK OF MAGNESIA) suspension 30 mL  30 mL Oral Daily PRN Clapacs, John T, MD      . mirtazapine (REMERON) tablet 15 mg  15 mg Oral QHS Pucilowska, Jolanta B, MD   15 mg at 07/08/18 2130  . temazepam (RESTORIL) capsule 15 mg  15 mg Oral QHS PRN He, Jun, MD      . venlafaxine XR (EFFEXOR-XR) 24 hr capsule 300 mg  300 mg Oral Q breakfast Pucilowska, Jolanta B, MD   300 mg at 07/09/18 0755   PTA Medications: Medications Prior to Admission  Medication Sig Dispense Refill Last Dose  . ARIPiprazole (ABILIFY) 20 MG tablet Take 1 tablet (20 mg total) by mouth daily. 30 tablet 0 06/28/2018 at  0700  . divalproex (DEPAKOTE) 250 MG DR tablet Take 1 tablet (250 mg total) by mouth daily with breakfast. 30 tablet 0 06/28/2018 at 0700  . divalproex (DEPAKOTE) 500 MG DR tablet Take 2 tablets (1,000 mg total) by mouth at bedtime. 60 tablet 0 06/27/2018 at 2100  . traZODone (DESYREL) 50 MG tablet Take 1 tablet (50 mg total) by mouth at bedtime as needed for sleep. 30 tablet 0 06/27/2018 at 2100    Patient Stressors: Occupational concerns Traumatic event  Patient Strengths: Average or above average intelligence Communication skills Supportive family/friends  Treatment Modalities: Medication Management, Group therapy, Case management,  1 to 1 session with clinician, Psychoeducation, Recreational therapy.   Physician Treatment Plan for Primary Diagnosis: Bipolar I disorder, current or most recent episode depressed, with psychotic features ([place]) Long Term Goal(s): Improvement in symptoms so as ready for discharge NA   Short Term Goals: Ability to identify changes in lifestyle to reduce recurrence of condition will improve Ability to verbalize feelings will improve Ability to disclose and discuss suicidal ideas Ability to demonstrate self-control will improve Ability to identify and develop effective coping behaviors will improve Ability to maintain clinical measurements within normal limits will improve Ability to identify triggers associated with substance abuse/mental health issues will improve NA  Medication Management: Evaluate patient's  response, side effects, and tolerance of medication regimen.  Therapeutic Interventions: 1 to 1 sessions, Unit Group sessions and Medication administration.  Evaluation of Outcomes: Progressing  Physician Treatment Plan for Secondary Diagnosis: Principal Problem:   Bipolar I disorder, current or most recent episode depressed, with psychotic features ([place]) Active Problems:   Suicidal ideation  Long Term Goal(s): Improvement in symptoms so as  ready for discharge NA   Short Term Goals: Ability to identify changes in lifestyle to reduce recurrence of condition will improve Ability to verbalize feelings will improve Ability to disclose and discuss suicidal ideas Ability to demonstrate self-control will improve Ability to identify and develop effective coping behaviors will improve Ability to maintain clinical measurements within normal limits will improve Ability to identify triggers associated with substance abuse/mental health issues will improve NA     Medication Management: Evaluate patient's response, side effects, and tolerance of medication regimen.  Therapeutic Interventions: 1 to 1 sessions, Unit Group sessions and Medication administration.  Evaluation of Outcomes: Progressing   RN Treatment Plan for Primary Diagnosis: Bipolar I disorder, current or most recent episode depressed, with psychotic features ([place]) Long Term Goal(s): Knowledge of disease and therapeutic regimen to maintain health will improve  Short Term Goals: Ability to participate in decision making will improve, Ability to identify and develop effective coping behaviors will improve and Compliance with prescribed medications will improve  Medication Management: RN will administer medications as ordered by provider, will assess and evaluate patient's response and provide education to patient for prescribed medication. RN will report any adverse and/or side effects to prescribing provider.  Therapeutic Interventions: 1 on 1 counseling sessions, Psychoeducation, Medication administration, Evaluate responses to treatment, Monitor vital signs and CBGs as ordered, Perform/monitor CIWA, COWS, AIMS and Fall Risk screenings as ordered, Perform wound care treatments as ordered.  Evaluation of Outcomes: Progressing   LCSW Treatment Plan for Primary Diagnosis: Bipolar I disorder, current or most recent episode depressed, with psychotic features ([place]) Long Term  Goal(s): Safe transition to appropriate next level of care at discharge, Engage patient in therapeutic group addressing interpersonal concerns.  Short Term Goals: Engage patient in aftercare planning with referrals and resources, Increase emotional regulation and Increase skills for wellness and recovery  Therapeutic Interventions: Assess for all discharge needs, 1 to 1 time with Social worker, Explore available resources and support systems, Assess for adequacy in community support network, Educate family and significant other(s) on suicide prevention, Complete Psychosocial Assessment, Interpersonal group therapy.  Evaluation of Outcomes: Progressing   Progress in Treatment: Attending groups: No. Participating in groups: No. Taking medication as prescribed: Yes. Toleration medication: Yes. Family/Significant other contact made: Yes, individual(s) contacted:  Worthy Flank, patients wife Patient understands diagnosis: Yes. Discussing patient identified problems/goals with staff: Yes. Medical problems stabilized or resolved: Yes. Denies suicidal/homicidal ideation: Yes. Issues/concerns per patient self-inventory: No. Other: None at this time  New problem(s) identified: No, Describe:  None at this time.  New Short Term/Long Term Goal(s): Pt will demonstrate an improved mood by 48 hours prior to discharge, and will report 0 SI for at least 48 consecutive hours prior to discharge.   Patient Goals:  Pt reported his goal for treatment is, "to get better."   Discharge Plan or Barriers: Pt will be discharged home and will continue tx in the outpatient setting with [facility].   Reason for Continuation of Hospitalization: Depression Medication stabilization  Estimated Length of Stay: 2 days    Attendees: Patient:  07/09/2018 10:48 AM  Physician:  Dr. Bary Leriche, MD 07/09/2018 10:48 AM  Nursing: West Pugh RN 07/09/2018 10:48 AM  RN Care Manager: 07/09/2018 10:48 AM  Social Worker:   Darin Engels, [place] 07/09/2018 10:48 AM  Recreational Therapist:  07/09/2018 10:48 AM  Other: Derrek Gu, LCSW 07/09/2018 10:48 AM  Other:  07/09/2018 10:48 AM  Other:   07/09/2018 10:48 AM    Scribe for Treatment Team: Darin Engels, LCSW 07/09/2018 10:48 AM

## 2018-07-09 NOTE — Progress Notes (Addendum)
[facility] MD Progress Note  07/09/2018 8:54 PM Steven Knight  MRN:  [id number]  Subjective: Anticipated discharge on Sunday.  Steven Knight continues to be depressed with flat affects and tremendous cognitive difficulties. He does not comprehend simple concepts including ECT. He denies suicidal or homicidal ideation and claims to be improving.  Spoke with the wife who visited last night and is very worried about cognitive decline. She will pick him up on Sunday. He will continue current regimen for another week at home. He will follow up with [facility]. We encourage family to continue discuss ECT if he does not improve.    Principal Problem: Bipolar I disorder, current or most recent episode depressed, with psychotic features [facility]) Diagnosis:   Patient Active Problem List   Diagnosis Date Noted  . Bipolar I disorder, current or most recent episode depressed, with psychotic features ([place]) [F31.5] 06/28/2018    Priority: High  . Bipolar I disorder, current or most recent episode manic, with psychotic features ([place]) [F31.2]     Priority: High  . Suicidal ideation [R45.851] 06/28/2018   Total Time spent with patient: 20 minutes  Past Psychiatric History: bipolar disorder  Past Medical History:  Past Medical History:  Diagnosis Date  . Autism   . Autism    History reviewed. No pertinent surgical history. Family History: History reviewed. No pertinent family history. Family Psychiatric  History: none Social History:  Social History   Substance and Sexual Activity  Alcohol Use Never  . Frequency: Never     Social History   Substance and Sexual Activity  Drug Use Never    Social History   Socioeconomic History  . Marital status: Married    Spouse name: Not on file  . Number of children: Not on file  . Years of education: Not on file  . Highest education level: Not on file  Occupational History  . Not on file  Social Needs  . Financial resource strain: Not on file  . Food  insecurity:    Worry: Not on file    Inability: Not on file  . Transportation needs:    Medical: Not on file    Non-medical: Not on file  Tobacco Use  . Smoking status: Never Smoker  . Smokeless tobacco: Never Used  Substance and Sexual Activity  . Alcohol use: Never    Frequency: Never  . Drug use: Never  . Sexual activity: Yes  Lifestyle  . Physical activity:    Days per week: Not on file    Minutes per session: Not on file  . Stress: Not on file  Relationships  . Social connections:    Talks on phone: Not on file    Gets together: Not on file    Attends religious service: Not on file    Active member of club or organization: Not on file    Attends meetings of clubs or organizations: Not on file    Relationship status: Not on file  Other Topics Concern  . Not on file  Social History Narrative  . Not on file   Additional Social History:                         Sleep: Fair  Appetite:  Fair  Current Medications: Current Facility-Administered Medications  Medication Dose Route Frequency Provider Last Rate Last Dose  . acetaminophen (TYLENOL) tablet 650 mg  650 mg Oral Q6H PRN Clapacs, Madie Reno, MD      .  alum & mag hydroxide-simeth (MAALOX/MYLANTA) 200-200-20 MG/5ML suspension 30 mL  30 mL Oral Q4H PRN Clapacs, John T, MD      . ARIPiprazole (ABILIFY) tablet 20 mg  20 mg Oral Daily Clapacs, Madie Reno, MD   20 mg at 07/09/18 0755  . divalproex (DEPAKOTE) DR tablet 1,000 mg  1,000 mg Oral QHS Clapacs, John T, MD   1,000 mg at 07/08/18 2130  . divalproex (DEPAKOTE) DR tablet 250 mg  250 mg Oral Daily Clapacs, Madie Reno, MD   250 mg at 07/09/18 0755  . feeding supplement (ENSURE ENLIVE) (ENSURE ENLIVE) liquid 237 mL  237 mL Oral TID BM Lacey Dotson B, MD   237 mL at 07/09/18 1953  . hydrOXYzine (ATARAX/VISTARIL) tablet 50 mg  50 mg Oral TID PRN Clapacs, John T, MD      . magnesium hydroxide (MILK OF MAGNESIA) suspension 30 mL  30 mL Oral Daily PRN Clapacs, John T,  MD      . mirtazapine (REMERON) tablet 15 mg  15 mg Oral QHS Inge Waldroup B, MD   15 mg at 07/08/18 2130  . temazepam (RESTORIL) capsule 7.5 mg  7.5 mg Oral QHS PRN Delvis Kau B, MD      . venlafaxine XR (EFFEXOR-XR) 24 hr capsule 300 mg  300 mg Oral Q breakfast Dezzie Badilla B, MD   300 mg at 07/09/18 0755    Lab Results: No results found for this or any previous visit (from the past 48 hour(s)).  Blood Alcohol level:  Lab Results  Component Value Date   ETH <10 06/28/2018   ETH <10 40/98/1191    Metabolic Disorder Labs: Lab Results  Component Value Date   HGBA1C 5.5 05/09/2018   MPG 111.15 05/09/2018   No results found for: PROLACTIN Lab Results  Component Value Date   CHOL 145 05/09/2018   TRIG 104 05/09/2018   HDL 35 (L) 05/09/2018   CHOLHDL 4.1 05/09/2018   VLDL 21 05/09/2018   LDLCALC 89 05/09/2018    Physical Findings: AIMS: Facial and Oral Movements Muscles of Facial Expression: None, normal Lips and Perioral Area: None, normal Jaw: None, normal Tongue: None, normal,Extremity Movements Upper (arms, wrists, hands, fingers): None, normal Lower (legs, knees, ankles, toes): None, normal, Trunk Movements Neck, shoulders, hips: None, normal, Overall Severity Severity of abnormal movements (highest score from questions above): None, normal Incapacitation due to abnormal movements: None, normal Patient's awareness of abnormal movements (rate only patient's report): No Awareness, Dental Status Current problems with teeth and/or dentures?: No Does patient usually wear dentures?: No  CIWA:    COWS:     Musculoskeletal: Strength & Muscle Tone: within normal limits Gait & Station: normal Patient leans: N/A  Psychiatric Specialty Exam: Physical Exam  Nursing note and vitals reviewed. Psychiatric: Judgment and thought content normal. His affect is blunt. His speech is delayed. He is slowed and withdrawn. Cognition and memory are impaired.     Review of Systems  Neurological: Negative.   Psychiatric/Behavioral: Positive for depression.  All other systems reviewed and are negative.   Blood pressure 110/81, pulse (!) 102, temperature (!) 97.5 F (36.4 C), temperature source Oral, resp. rate 18, height 6' 2"  (1.88 m), weight 77.6 kg (171 lb), SpO2 98 %.Body mass index is 21.96 kg/m.  General Appearance: Casual  Eye Contact:  Good  Speech:  Slow  Volume:  Decreased  Mood:  Depressed  Affect:  Blunt  Thought Process:  Disorganized and Descriptions of Associations: Tangential  Orientation:  Full (Time, Place, and Person)  Thought Content:  WDL  Suicidal Thoughts:  No  Homicidal Thoughts:  No  Memory:  Immediate;   Poor Recent;   Poor Remote;   Poor  Judgement:  Fair  Insight:  Present  Psychomotor Activity:  Decreased  Concentration:  Concentration: Poor and Attention Span: Poor  Recall:  Poor  Fund of Knowledge:  Fair  Language:  Poor  Akathisia:  No  Handed:  Right  AIMS (if indicated):     Assets:  Desire for Improvement Financial Resources/Insurance Housing Intimacy Physical Health Resilience Social Support  ADL's:  Intact  Cognition:  WNL  Sleep:  Number of Hours: 8     Treatment Plan Summary: Daily contact with patient to assess and evaluate symptoms and progress in treatment and Medication management   Steven Knight is a 47 year old male with a history of bipolar disorder, hospitalized here no long ago for a manic episode, returns to the hospital depressed and suicidal.The patient has been improving rather slowly. Started discussion about ECT but the patient declines.  #Suicidal ideation, resolved -patient able to contract to safety in the hospital  #Mood, improving.  -continue Abilify 20 mg daily -continueDepakote 250 mg in AM, 1000 mg in HS -continueEffexor to 300 mg daily -continueRemeron 15 mg nightly -ECT consultis appreciated  #Insomnia, improved -continueRestoril 7.5 mg  nightly PRN  #Poor appetite -Ensure TID  #Labs completed recently  #Disposition -discharge with family -follow up with [facility]    Steven Slick, MD 07/09/2018, 8:54 PM

## 2018-07-09 NOTE — BHH Group Notes (Signed)

## 2018-07-10 NOTE — Plan of Care (Signed)
Patient able to verbalize understanding  of information received . Emotional and mental status improved . Limited participation with peers Continue to isolate to room . Voice no concerns around sleep. Working on Special educational needs teacher given . Voice of no safety concerns . Denies suicidal ideations    Problem: Education: Goal: Knowledge of [place] [facility] Education information/materials will improve Outcome: Progressing Goal: Emotional status will improve Outcome: Progressing Goal: Mental status will improve Outcome: Progressing Goal: Verbalization of understanding the information provided will improve Outcome: Progressing   Problem: Activity: Goal: Interest or engagement in activities will improve Outcome: Progressing Goal: Sleeping patterns will improve Outcome: Progressing   Problem: Health Behavior/Discharge Planning: Goal: Identification of resources available to assist in meeting health care needs will improve Outcome: Progressing Goal: Compliance with treatment plan for underlying cause of condition will improve Outcome: Progressing   Problem: Coping: Goal: Coping ability will improve Outcome: Progressing Goal: Will verbalize feelings Outcome: Progressing   Problem: Role Relationship: Goal: Will demonstrate positive changes in social behaviors and relationships Outcome: Progressing   Problem: Safety: Goal: Ability to disclose and discuss suicidal ideas will improve Outcome: Progressing Goal: Ability to identify and utilize support systems that promote safety will improve Outcome: Progressing   Problem: Education: Goal: Knowledge of General Education information will improve Description Including pain rating scale, medication(s)/side effects and non-pharmacologic comfort measures Outcome: Progressing

## 2018-07-10 NOTE — Progress Notes (Signed)
[facility] MD Progress Note  07/10/2018 1:47 PM Steven Knight  MRN:  [id number] Subjective: Follow-up for this patient with depression likely related to bipolar disorder possible autistic spectrum disorder.  Patient says he is feeling better.  Still feels anxious and a little overwhelmed especially if he thinks about going back to work but denies any suicidal thoughts or hallucinations.  He says he is looking forward to discharge tomorrow.  No physical complaints or new requests today. Principal Problem: Bipolar I disorder, current or most recent episode depressed, with psychotic features [facility]) Diagnosis:   Patient Active Problem List   Diagnosis Date Noted  . Bipolar I disorder, current or most recent episode depressed, with psychotic features ([place]) [F31.5] 06/28/2018  . Suicidal ideation [R45.851] 06/28/2018  . Bipolar I disorder, current or most recent episode manic, with psychotic features ([place]) [F31.2]    Total Time spent with patient: 20 minutes  Past Psychiatric History: Past history of more than one hospitalization in a row with suicidal ideation and what seems a recent breakdown in his coping skills.  Past Medical History:  Past Medical History:  Diagnosis Date  . Autism   . Autism    History reviewed. No pertinent surgical history. Family History: History reviewed. No pertinent family history. Family Psychiatric  History: See previous note Social History:  Social History   Substance and Sexual Activity  Alcohol Use Never  . Frequency: Never     Social History   Substance and Sexual Activity  Drug Use Never    Social History   Socioeconomic History  . Marital status: Married    Spouse name: Not on file  . Number of children: Not on file  . Years of education: Not on file  . Highest education level: Not on file  Occupational History  . Not on file  Social Needs  . Financial resource strain: Not on file  . Food insecurity:    Worry: Not on file    Inability:  Not on file  . Transportation needs:    Medical: Not on file    Non-medical: Not on file  Tobacco Use  . Smoking status: Never Smoker  . Smokeless tobacco: Never Used  Substance and Sexual Activity  . Alcohol use: Never    Frequency: Never  . Drug use: Never  . Sexual activity: Yes  Lifestyle  . Physical activity:    Days per week: Not on file    Minutes per session: Not on file  . Stress: Not on file  Relationships  . Social connections:    Talks on phone: Not on file    Gets together: Not on file    Attends religious service: Not on file    Active member of club or organization: Not on file    Attends meetings of clubs or organizations: Not on file    Relationship status: Not on file  Other Topics Concern  . Not on file  Social History Narrative  . Not on file   Additional Social History:                         Sleep: Fair  Appetite:  Fair  Current Medications: Current Facility-Administered Medications  Medication Dose Route Frequency Provider Last Rate Last Dose  . acetaminophen (TYLENOL) tablet 650 mg  650 mg Oral Q6H PRN Hera Celaya T, MD      . alum & mag hydroxide-simeth (MAALOX/MYLANTA) 200-200-20 MG/5ML suspension 30 mL  30  mL Oral Q4H PRN Timmia Cogburn T, MD      . ARIPiprazole (ABILIFY) tablet 20 mg  20 mg Oral Daily Kacy Conely, Madie Reno, MD   20 mg at 07/10/18 0826  . divalproex (DEPAKOTE) DR tablet 1,000 mg  1,000 mg Oral QHS Jesiel Garate T, MD   1,000 mg at 07/09/18 2111  . divalproex (DEPAKOTE) DR tablet 250 mg  250 mg Oral Daily Lior Hoen, Madie Reno, MD   250 mg at 07/10/18 0826  . feeding supplement (ENSURE ENLIVE) (ENSURE ENLIVE) liquid 237 mL  237 mL Oral TID BM Pucilowska, Jolanta B, MD   237 mL at 07/10/18 0945  . hydrOXYzine (ATARAX/VISTARIL) tablet 50 mg  50 mg Oral TID PRN Teresa Nicodemus T, MD      . magnesium hydroxide (MILK OF MAGNESIA) suspension 30 mL  30 mL Oral Daily PRN Jersee Winiarski T, MD      . mirtazapine (REMERON) tablet 15 mg  15  mg Oral QHS Pucilowska, Jolanta B, MD   15 mg at 07/09/18 2111  . temazepam (RESTORIL) capsule 7.5 mg  7.5 mg Oral QHS PRN Pucilowska, Jolanta B, MD      . venlafaxine XR (EFFEXOR-XR) 24 hr capsule 300 mg  300 mg Oral Q breakfast Pucilowska, Jolanta B, MD   300 mg at 07/10/18 8588    Lab Results: No results found for this or any previous visit (from the past 48 hour(s)).  Blood Alcohol level:  Lab Results  Component Value Date   ETH <10 06/28/2018   ETH <10 50/27/7412    Metabolic Disorder Labs: Lab Results  Component Value Date   HGBA1C 5.5 05/09/2018   MPG 111.15 05/09/2018   No results found for: PROLACTIN Lab Results  Component Value Date   CHOL 145 05/09/2018   TRIG 104 05/09/2018   HDL 35 (L) 05/09/2018   CHOLHDL 4.1 05/09/2018   VLDL 21 05/09/2018   LDLCALC 89 05/09/2018    Physical Findings: AIMS: Facial and Oral Movements Muscles of Facial Expression: None, normal Lips and Perioral Area: None, normal Jaw: None, normal Tongue: None, normal,Extremity Movements Upper (arms, wrists, hands, fingers): None, normal Lower (legs, knees, ankles, toes): None, normal, Trunk Movements Neck, shoulders, hips: None, normal, Overall Severity Severity of abnormal movements (highest score from questions above): None, normal Incapacitation due to abnormal movements: None, normal Patient's awareness of abnormal movements (rate only patient's report): No Awareness, Dental Status Current problems with teeth and/or dentures?: No Does patient usually wear dentures?: No  CIWA:    COWS:     Musculoskeletal: Strength & Muscle Tone: within normal limits Gait & Station: normal Patient leans: N/A  Psychiatric Specialty Exam: Physical Exam  Nursing note and vitals reviewed. Constitutional: He appears well-developed and well-nourished.  HENT:  Head: Normocephalic and atraumatic.  Eyes: Pupils are equal, round, and reactive to light. Conjunctivae are normal.  Neck: Normal range of  motion.  Cardiovascular: Regular rhythm and normal heart sounds.  Respiratory: Effort normal.  GI: Soft.  Musculoskeletal: Normal range of motion.  Neurological: He is alert.  Skin: Skin is warm and dry.  Psychiatric: His affect is blunt. His speech is delayed. He is slowed. Thought content is not paranoid. Cognition and memory are impaired. He expresses impulsivity. He expresses no homicidal and no suicidal ideation.    Review of Systems  Constitutional: Negative.   HENT: Negative.   Eyes: Negative.   Respiratory: Negative.   Cardiovascular: Negative.   Gastrointestinal: Negative.   Musculoskeletal: Negative.  Skin: Negative.   Neurological: Negative.   Psychiatric/Behavioral: Negative.     Blood pressure 111/83, pulse 84, temperature 97.7 F (36.5 C), temperature source Oral, resp. rate 16, height 6' 2"  (1.88 m), weight 77.6 kg (171 lb), SpO2 100 %.Body mass index is 21.96 kg/m.  General Appearance: Fairly Groomed  Eye Contact:  Good  Speech:  Slow  Volume:  Decreased  Mood:  Euthymic  Affect:  Constricted  Thought Process:  Goal Directed  Orientation:  Full (Time, Place, and Person)  Thought Content:  Logical  Suicidal Thoughts:  No  Homicidal Thoughts:  No  Memory:  Immediate;   Fair Recent;   Fair Remote;   Fair  Judgement:  Fair  Insight:  Fair  Psychomotor Activity:  Decreased  Concentration:  Concentration: Fair  Recall:  [employer] of Knowledge:  Fair  Language:  Fair  Akathisia:  No  Handed:  Right  AIMS (if indicated):     Assets:  Desire for Improvement Housing Physical Health Resilience  ADL's:  Intact  Cognition:  WNL  Sleep:  Number of Hours: 8.5     Treatment Plan Summary: Daily contact with patient to assess and evaluate symptoms and progress in treatment, Medication management and Plan Supportive counseling and review of discharge plan.  Review of the importance of keeping his stress under control and of making sure he has a good follow-up  plan.  No change of medication.  The tentative plan for tomorrow is discharged with follow-up to be arranged as per treatment team notes.  No change to medication today.  Alethia Berthold, MD 07/10/2018, 1:47 PM

## 2018-07-10 NOTE — [facility] Group Notes (Signed)
LCSW Group Therapy Note  07/10/2018 1:15pm  Type of Therapy and Topic: Group Therapy: Holding on to Grudges   Participation Level: Did Not Attend   Description of Group:  In this group patients will be asked to explore and define a grudge. Patients will be guided to discuss their thoughts, feelings, and reasons as to why people have grudges. Patients will process the impact grudges have on daily life and identify thoughts and feelings related to holding grudges. Facilitator will challenge patients to identify ways to let go of grudges and the benefits this provides. Patients will be confronted to address why one struggles letting go of grudges. Lastly, patients will identify feelings and thoughts related to what life would look like without grudges. This group will be process-oriented, with patients participating in exploration of their own experiences, giving and receiving support, and processing challenge from other group members.  Therapeutic Goals:  1. Patient will identify specific grudges related to their personal life.  2. Patient will identify feelings, thoughts, and beliefs around grudges.  3. Patient will identify how one releases grudges appropriately.  4. Patient will identify situations where they could have let go of the grudge, but instead chose to hold on.   Summary of Patient Progress: Pt was invited to attend group but chose not to attend. CSW will continue to encourage pt to attend group throughout their admission.    Therapeutic Modalities:  Cognitive Behavioral Therapy  Solution Focused Therapy  Motivational Interviewing  Brief Therapy   Dajae Kizer  CUEBAS-COLON, LCSW 07/10/2018 12:45 PM

## 2018-07-10 NOTE — Plan of Care (Addendum)
Patient found awake in bed upon my arrival. Patient is neither visible nor social this evening. Remains depressed and isolative. Affect is congruent. Minimally verbal. Cooperative and polite. Denies SI/HI/AVH. Denies pain. Reports eating and voiding adequately. Compliant with HS medications. Q 15 minute checks maintained. Will continue to monitor throughout the shift. Patient slept 8.5 hours. No apparent distress. Will endorse care to oncoming shift.   Problem: Education: Goal: Knowledge of [place] [facility] Education information/materials will improve Outcome: Progressing Goal: Verbalization of understanding the information provided will improve Outcome: Progressing   Problem: Health Behavior/Discharge Planning: Goal: Compliance with treatment plan for underlying cause of condition will improve Outcome: Progressing   Problem: Coping: Goal: Coping ability will improve Outcome: Progressing Goal: Will verbalize feelings Outcome: Progressing   Problem: Education: Goal: Emotional status will improve Outcome: Not Progressing Goal: Mental status will improve Outcome: Not Progressing   Problem: Activity: Goal: Interest or engagement in activities will improve Outcome: Not Progressing Goal: Sleeping patterns will improve Outcome: Not Progressing   Problem: Role Relationship: Goal: Will demonstrate positive changes in social behaviors and relationships Outcome: Not Progressing

## 2018-07-10 NOTE — Progress Notes (Addendum)
D: Patient stated slept good last night .Stated appetite  good and energy level  Is normal. Stated concentration is good . Stated on Depression scale 0 , hopeless 0 and anxiety 0 .( low 0-10 high) Denies suicidal  homicidal ideations  .  No auditory hallucinations  No pain concerns . Appropriate ADL'S. Interacting with peers and staff. Patient able to verbalize understanding  of information received . Emotional and mental status improved . Limited participation with peers Continue to isolate to room . Voice no concerns around sleep. Working on Special educational needs teacher given . Voice of no safety concerns . Denies suicidal ideations   A: Encourage patient participation with unit programming . Instruction  Given on  Medication , verbalize understanding.  R: Voice no other concerns. Staff continue to monitor

## 2018-07-11 NOTE — Plan of Care (Signed)
  Problem: Education: Goal: Mental status will improve Outcome: Progressing  Patient's thoughts are organized, alert and oriented x 4.

## 2018-07-11 NOTE — [facility] Suicide Risk Assessment (Signed)
[facility] Discharge Suicide Risk Assessment   Principal Problem: Bipolar I disorder, current or most recent episode depressed, with psychotic features [facility]) Discharge Diagnoses:  Patient Active Problem List   Diagnosis Date Noted  . Bipolar I disorder, current or most recent episode depressed, with psychotic features ([place]) [F31.5] 06/28/2018  . Suicidal ideation [R45.851] 06/28/2018  . Bipolar I disorder, current or most recent episode manic, with psychotic features ([place]) [F31.2]     Total Time spent with patient: 45 minutes  Musculoskeletal: Strength & Muscle Tone: within normal limits Gait & Station: normal Patient leans: N/A  Psychiatric Specialty Exam: Review of Systems  Constitutional: Negative.   HENT: Negative.   Eyes: Negative.   Respiratory: Negative.   Cardiovascular: Negative.   Gastrointestinal: Negative.   Musculoskeletal: Negative.   Skin: Negative.   Neurological: Negative.   Psychiatric/Behavioral: Negative for depression, hallucinations, memory loss, substance abuse and suicidal ideas. The patient is nervous/anxious. The patient does not have insomnia.     Blood pressure 99/74, pulse 93, temperature 98.4 F (36.9 C), temperature source Oral, resp. rate 19, height 6' 2"  (1.88 m), weight 77.6 kg (171 lb), SpO2 99 %.Body mass index is 21.96 kg/m.  General Appearance: Fairly Groomed  Engineer, water::  Fair  Speech:  [phone]  Volume:  Decreased  Mood:  Euthymic  Affect:  Constricted  Thought Process:  Coherent  Orientation:  Full (Time, Place, and Person)  Thought Content:  Logical  Suicidal Thoughts:  No  Homicidal Thoughts:  No  Memory:  Immediate;   Fair Recent;   Fair Remote;   Fair  Judgement:  Fair  Insight:  Fair  Psychomotor Activity:  Normal  Concentration:  Fair  Recall:  [employer] [place]  Language: Fair  Akathisia:  No  Handed:  Right  AIMS (if indicated):     Assets:  Communication Skills Housing Resilience Social Support   Sleep:  Number of Hours: 8.5  Cognition: WNL  ADL's:  Intact   Mental Status Per Nursing Assessment::   On Admission:  Self-harm thoughts  Demographic Factors:  Male and Caucasian  Loss Factors: Decrease in vocational status  Historical Factors: Impulsivity  Risk Reduction Factors:   Sense of responsibility to family, Religious beliefs about death and Positive social support  Continued Clinical Symptoms:  Bipolar Disorder:   Depressive phase Depression:   Anhedonia  Cognitive Features That Contribute To Risk:  Closed-mindedness    Suicide Risk:  Mild:  Suicidal ideation of limited frequency, intensity, duration, and specificity.  There are no identifiable plans, no associated intent, mild dysphoria and related symptoms, good self-control (both objective and subjective assessment), few other risk factors, and identifiable protective factors, including available and accessible social support.  Follow-up Information    [place] on 07/14/2018.   Why:  You are scheduled to follow up with [facility] on Wednesday July 14, 2018 at 9:30am. Thank you! Contact information: [place] [address] [phone]           Plan Of Care/Follow-up recommendations:  Activity:  as tolerated Diet:  regular Other:  follow up with outpatient mental health care  Alethia Berthold, MD 07/11/2018, 11:41 AM

## 2018-07-11 NOTE — Discharge Summary (Signed)
Physician Discharge Summary Note  Patient:  Steven Knight is an 47 y.o., male MRN:  [id number] DOB:  07/18/1971 Patient phone:  [phone] (home)  Patient address:   [place] [address],  Total Time spent with patient: 45 minutes  Date of Admission:  06/29/2018 Date of Discharge: July 11, 2018  Reason for Admission: Patient admitted through the emergency room for the emergence once again of suicidal ideation profound anxiety confusion inability to function at home.  History of bipolar type symptoms possible autistic spectrum disorder  Principal Problem: Bipolar I disorder, current or most recent episode depressed, with psychotic features [facility]) Discharge Diagnoses: Patient Active Problem List   Diagnosis Date Noted  . Bipolar I disorder, current or most recent episode depressed, with psychotic features ([place]) [F31.5] 06/28/2018  . Suicidal ideation [R45.851] 06/28/2018  . Bipolar I disorder, current or most recent episode manic, with psychotic features ([place]) [F31.2]     Past Psychiatric History: History of previous diagnosis of autistic spectrum disorder recent emergence of major mood problems with depression and psychotic features and agitation at times  Past Medical History:  Past Medical History:  Diagnosis Date  . Autism   . Autism    History reviewed. No pertinent surgical history. Family History: History reviewed. No pertinent family history. Family Psychiatric  History: None noted Social History:  Social History   Substance and Sexual Activity  Alcohol Use Never  . Frequency: Never     Social History   Substance and Sexual Activity  Drug Use Never    Social History   Socioeconomic History  . Marital status: Married    Spouse name: Not on file  . Number of children: Not on file  . Years of education: Not on file  . Highest education level: Not on file  Occupational History  . Not on file  Social Needs  . Financial resource  strain: Not on file  . Food insecurity:    Worry: Not on file    Inability: Not on file  . Transportation needs:    Medical: Not on file    Non-medical: Not on file  Tobacco Use  . Smoking status: Never Smoker  . Smokeless tobacco: Never Used  Substance and Sexual Activity  . Alcohol use: Never    Frequency: Never  . Drug use: Never  . Sexual activity: Yes  Lifestyle  . Physical activity:    Days per week: Not on file    Minutes per session: Not on file  . Stress: Not on file  Relationships  . Social connections:    Talks on phone: Not on file    Gets together: Not on file    Attends religious service: Not on file    Active member of club or organization: Not on file    Attends meetings of clubs or organizations: Not on file    Relationship status: Not on file  Other Topics Concern  . Not on file  Social History Narrative  . Not on file    Hospital Course: Patient was admitted to the psychiatric ward.  15-minute checks.  Medications were adjusted.  Patient was engaged in groups and individual therapy regularly.  He was observed to continue to have a somewhat blunted affect and to be anxious much of the time but he gradually improved with his mood.  Patient was offered the opportunity to consider ECT but did not want to consider it at this point.  Condition improved to the  point that he no longer appeared to be acutely dangerous and he is being discharged with a plan for follow-up with his outpatient provider on current medication.  Physical Findings: AIMS: Facial and Oral Movements Muscles of Facial Expression: None, normal Lips and Perioral Area: None, normal Jaw: None, normal Tongue: None, normal,Extremity Movements Upper (arms, wrists, hands, fingers): None, normal Lower (legs, knees, ankles, toes): None, normal, Trunk Movements Neck, shoulders, hips: None, normal, Overall Severity Severity of abnormal movements (highest score from questions above): None,  normal Incapacitation due to abnormal movements: None, normal Patient's awareness of abnormal movements (rate only patient's report): No Awareness, Dental Status Current problems with teeth and/or dentures?: No Does patient usually wear dentures?: No  CIWA:    COWS:     Musculoskeletal: Strength & Muscle Tone: within normal limits Gait & Station: normal Patient leans: N/A  Psychiatric Specialty Exam: Physical Exam  Nursing note and vitals reviewed. Constitutional: He appears well-developed and well-nourished.  HENT:  Head: Normocephalic and atraumatic.  Eyes: Pupils are equal, round, and reactive to light. Conjunctivae are normal.  Neck: Normal range of motion.  Cardiovascular: Regular rhythm and normal heart sounds.  Respiratory: Effort normal. No respiratory distress.  GI: Soft.  Musculoskeletal: Normal range of motion.  Neurological: He is alert.  Skin: Skin is warm and dry.  Psychiatric: Judgment normal. His affect is blunt. His speech is delayed. He is slowed. Thought content is not paranoid. Cognition and memory are normal. He expresses no homicidal and no suicidal ideation.    Review of Systems  Constitutional: Negative.   HENT: Negative.   Eyes: Negative.   Respiratory: Negative.   Cardiovascular: Negative.   Gastrointestinal: Negative.   Musculoskeletal: Negative.   Skin: Negative.   Neurological: Negative.   Psychiatric/Behavioral: Negative.     Blood pressure 99/74, pulse 93, temperature 98.4 F (36.9 C), temperature source Oral, resp. rate 19, height 6' 2"  (1.88 m), weight 77.6 kg (171 lb), SpO2 99 %.Body mass index is 21.96 kg/m.  General Appearance: Fairly Groomed  Eye Contact:  Good  Speech:  Clear and Coherent  Volume:  Decreased  Mood:  Euthymic  Affect:  Constricted  Thought Process:  Goal Directed  Orientation:  Full (Time, Place, and Person)  Thought Content:  Logical  Suicidal Thoughts:  No  Homicidal Thoughts:  No  Memory:  Immediate;    Fair Recent;   Fair Remote;   Fair  Judgement:  Fair  Insight:  Fair  Psychomotor Activity:  Normal  Concentration:  Concentration: Fair  Recall:  [employer] [place]:  Fair  Language:  Fair  Akathisia:  No  Handed:  Right  AIMS (if indicated):     Assets:  Desire for Improvement Housing Physical Health Resilience Social Support  ADL's:  Intact  Cognition:  WNL  Sleep:  Number of Hours: 8.5     Have you used any form of tobacco in the last 30 days? (Cigarettes, Smokeless Tobacco, Cigars, and/or Pipes): No  Has this patient used any form of tobacco in the last 30 days? (Cigarettes, Smokeless Tobacco, Cigars, and/or Pipes) Yes, No  Blood Alcohol level:  Lab Results  Component Value Date   ETH <10 06/28/2018   ETH <10 03/54/6568    Metabolic Disorder Labs:  Lab Results  Component Value Date   HGBA1C 5.5 05/09/2018   MPG 111.15 05/09/2018   No results found for: PROLACTIN Lab Results  Component Value Date   CHOL 145 05/09/2018   TRIG  104 05/09/2018   HDL 35 (L) 05/09/2018   CHOLHDL 4.1 05/09/2018   VLDL 21 05/09/2018   LDLCALC 89 05/09/2018    See Psychiatric Specialty Exam and Suicide Risk Assessment completed by Attending Physician prior to discharge.  Discharge destination:  Home  Is patient on multiple antipsychotic therapies at discharge:  No   Has Patient had three or more failed trials of antipsychotic monotherapy by history:  No  Recommended Plan for Multiple Antipsychotic Therapies: NA  Discharge Instructions    Diet - low sodium heart healthy   Complete by:  As directed    Increase activity slowly   Complete by:  As directed      Allergies as of 07/11/2018   No Known Allergies     Medication List    TAKE these medications     Indication  ARIPiprazole 20 MG tablet Commonly known as:  ABILIFY Take 1 tablet (20 mg total) by mouth daily.  Indication:  Manic Phase of Manic-Depression   divalproex 250 MG DR tablet Commonly known as:   DEPAKOTE Take 1 tablet (250 mg total) by mouth daily with breakfast.  Indication:  Manic Phase of Manic-Depression   divalproex 500 MG DR tablet Commonly known as:  DEPAKOTE Take 2 tablets (1,000 mg total) by mouth at bedtime.  Indication:  Manic Phase of Manic-Depression   mirtazapine 15 MG tablet Commonly known as:  REMERON Take 1 tablet (15 mg total) by mouth at bedtime.  Indication:  Major Depressive Disorder   traZODone 50 MG tablet Commonly known as:  DESYREL Take 1 tablet (50 mg total) by mouth at bedtime as needed for sleep.  Indication:  Trouble Sleeping   venlafaxine XR 150 MG 24 hr capsule Commonly known as:  EFFEXOR-XR Take 2 capsules (300 mg total) by mouth daily with breakfast.  Indication:  Major Depressive Disorder      Follow-up Information    [place] on 07/14/2018.   Why:  You are scheduled to follow up with [facility] on Wednesday July 14, 2018 at 9:30am. Thank you! Contact information: [place] [address] [phone]           Follow-up recommendations:  Activity:  Activity as tolerated Diet:  Regular diet Other:  Continue antidepressant medicine follow-up A with [facility]  Comments: Patient interviewed.  Discharge plan reviewed.  Patient understands that it will probably be to his benefit to stay out of work for a little while longer while he continues to recover.  Continue current medicines at discharge follow-up with [facility].  Signed: Alethia Berthold, MD 07/11/2018, 1:34 PM

## 2018-07-11 NOTE — Progress Notes (Signed)
  [facility] Adult Case Management Discharge Plan :  Will you be returning to the same living situation after discharge:  Yes,  pt is going back home with his wife At discharge, do you have transportation home?: Yes,  pt's wife will pick him up Do you have the ability to pay for your medications: Yes,  pt has private insurance  Release of information consent forms completed and in the chart;  Patient's signature needed at discharge.  Patient to Follow up at: Follow-up Information    [place] on 07/14/2018.   Why:  You are scheduled to follow up with [facility] on Wednesday July 14, 2018 at 9:30am. Thank you! Contact information: [place] [address] [phone]           Next level of care provider has access to [place] and Suicide Prevention discussed: Yes,  CSW discussed with pt  Have you used any form of tobacco in the last 30 days? (Cigarettes, Smokeless Tobacco, Cigars, and/or Pipes): No  Has patient been referred to the Quitline?: N/A patient is not a smoker  Patient has been referred for addiction treatment: N/A  Kay Ricciuti  CUEBAS-COLON, LCSW 07/11/2018, 11:03 AM

## 2018-07-11 NOTE — Progress Notes (Signed)
D: Pt denies SI/HI/AVH, affect is flat but brightens upon approach. Pt is pleasant and cooperative, his thoughts are organized he appears less anxious and he is interacting with peers and staff appropriately.  A: Pt was offered support and encouragement, given scheduled medications, and was was encouraged to attend groups. Q 15 minute checks were done for safety.  R:Pt attends groups and interacts well with peers and staff. Pt is taking medication. Pt has no complaints.Pt receptive to treatment and safety maintained on unit.

## 2018-08-04 ENCOUNTER — Inpatient Hospital Stay: Payer: BLUE CROSS/BLUE SHIELD

## 2018-08-04 ENCOUNTER — Other Ambulatory Visit: Payer: Self-pay

## 2018-08-04 ENCOUNTER — Encounter: Payer: Self-pay | Admitting: Oncology

## 2018-08-04 ENCOUNTER — Inpatient Hospital Stay: Payer: BLUE CROSS/BLUE SHIELD | Attending: Oncology | Admitting: Oncology

## 2018-08-04 VITALS — BP 114/79 | HR 90 | Temp 97.2°F | Resp 18 | Ht 74.0 in | Wt 188.5 lb

## 2018-08-04 DIAGNOSIS — D696 Thrombocytopenia, unspecified: Secondary | ICD-10-CM | POA: Diagnosis not present

## 2018-08-04 DIAGNOSIS — F319 Bipolar disorder, unspecified: Secondary | ICD-10-CM | POA: Insufficient documentation

## 2018-08-04 DIAGNOSIS — Z79899 Other long term (current) drug therapy: Secondary | ICD-10-CM | POA: Diagnosis not present

## 2018-08-04 DIAGNOSIS — F840 Autistic disorder: Secondary | ICD-10-CM | POA: Insufficient documentation

## 2018-08-04 LAB — CBC WITH DIFFERENTIAL/PLATELET
Basophils Absolute: 0 10*3/uL (ref 0–0.1)
Basophils Relative: 1 %
Eosinophils Absolute: 0.3 10*3/uL (ref 0–0.7)
Eosinophils Relative: 3 %
HCT: 43.9 % (ref 40.0–52.0)
Hemoglobin: 15.2 g/dL (ref 13.0–18.0)
Lymphocytes Relative: 19 %
Lymphs Abs: 2 10*3/uL (ref 1.0–3.6)
MCH: 30 pg (ref 26.0–34.0)
MCHC: 34.6 g/dL (ref 32.0–36.0)
MCV: 86.9 fL (ref 80.0–100.0)
Monocytes Absolute: 1 10*3/uL (ref 0.2–1.0)
Monocytes Relative: 9 %
Neutro Abs: 7.2 10*3/uL — ABNORMAL HIGH (ref 1.4–6.5)
Neutrophils Relative %: 68 %
Platelets: 47 10*3/uL — ABNORMAL LOW (ref 150–440)
RBC: 5.06 MIL/uL (ref 4.40–5.90)
RDW: 14.1 % (ref 11.5–14.5)
WBC: 10.5 10*3/uL (ref 3.8–10.6)

## 2018-08-04 LAB — LACTATE DEHYDROGENASE: LDH: 114 U/L (ref 98–192)

## 2018-08-04 LAB — FOLATE: Folate: 14.9 ng/mL (ref >5.9)

## 2018-08-04 LAB — VITAMIN B12: Vitamin B-12: 772 pg/mL (ref 180–914)

## 2018-08-04 LAB — TECHNOLOGIST SMEAR REVIEW

## 2018-08-04 NOTE — Progress Notes (Signed)
Patient here for initial visit. °

## 2018-08-04 NOTE — Progress Notes (Signed)
Hematology/Oncology Consult note [facility] Telephone:([phone] [phone]   Patient Care Team: Cletis Athens, MD as PCP - General (Internal Medicine)  REFERRING PROVIDER: Cletis Athens, MD CHIEF COMPLAINTS/REASON FOR VISIT:  Evaluation of thrombocytopenia  HISTORY OF PRESENTING ILLNESS:  Steven Knight is a  47 y.o.  male with PMH listed below who was referred to me for evaluation of thrombocytopenia  Patient recently had lab work done on 07/22/2018 which revealed thrombocytopenia, platelet counts 81 with comments at 2 platelet counts may be somewhat higher than reported due to aggregation of platelets in December. WBC 10.2, hemoglobin 14.9, MCV 86, neutrophil absolute 7.3 [normal ref 1.4-7.0] Creatinine 1.13, sodium 144, potassium 4.3, calcium 9.6, albumin 4.5, bilirubin 0.3, alkaline phosphatase 46, AST ALT normal.  Per patient's wife, patient's thrombocytopenia has been chronic.  They were told that patient had thrombocytopenia for about 1 to 2 years.  No bleeding events. No aggravating or improving factors.  Associated symptoms: Patient denies fatigue, weight loss, easy bruising, hema tochezia, hemoptysis.  History hepatitis or HIV infection.  Denies History of chronic liver disease denies Alcohol consumption denies  Patient has autism and bipolar disease.  Poor historian He was recently admitted at the behavior center for bipolar disease.  Depakote was started in June.   Review of Systems  Unable to perform ROS: Psychiatric disorder  Constitutional: Negative for fever, malaise/fatigue and weight loss.  HENT: Negative for hearing loss.   Respiratory: Negative for cough.   Cardiovascular: Negative for leg swelling.  Gastrointestinal: Negative for abdominal pain, nausea and vomiting.  Neurological: Negative for dizziness.    MEDICAL HISTORY:  Past Medical History:  Diagnosis Date  . Autism   . Autism   . Bipolar 1 disorder  ([place])     SURGICAL HISTORY: History reviewed. No pertinent surgical history.  SOCIAL HISTORY: Social History   Socioeconomic History  . Marital status: Married    Spouse name: Not on file  . Number of children: Not on file  . Years of education: Not on file  . Highest education level: Not on file  Occupational History  . Not on file  Social Needs  . Financial resource strain: Not on file  . Food insecurity:    Worry: Not on file    Inability: Not on file  . Transportation needs:    Medical: Not on file    Non-medical: Not on file  Tobacco Use  . Smoking status: Never Smoker  . Smokeless tobacco: Never Used  Substance and Sexual Activity  . Alcohol use: Never    Frequency: Never  . Drug use: Never  . Sexual activity: Yes  Lifestyle  . Physical activity:    Days per week: Not on file    Minutes per session: Not on file  . Stress: Not on file  Relationships  . Social connections:    Talks on phone: Not on file    Gets together: Not on file    Attends religious service: Not on file    Active member of club or organization: Not on file    Attends meetings of clubs or organizations: Not on file    Relationship status: Not on file  . Intimate partner violence:    Fear of current or ex partner: Not on file    Emotionally abused: Not on file    Physically abused: Not on file    Forced sexual activity: Not on file  Other Topics Concern  . Not on file  Social History Narrative  . Not on file    FAMILY HISTORY: Family History  Problem Relation Age of Onset  . Diabetes Mother     ALLERGIES:  has No Known Allergies.  MEDICATIONS:  Current Outpatient Medications  Medication Sig Dispense Refill  . ARIPiprazole (ABILIFY) 20 MG tablet Take 1 tablet (20 mg total) by mouth daily. 30 tablet 1  . divalproex (DEPAKOTE) 250 MG DR tablet Take 1 tablet (250 mg total) by mouth daily with breakfast. 30 tablet 1  . divalproex (DEPAKOTE) 500 MG DR tablet Take 2 tablets (1,000  mg total) by mouth at bedtime. 60 tablet 1  . mirtazapine (REMERON) 15 MG tablet Take 1 tablet (15 mg total) by mouth at bedtime. 30 tablet 1  . venlafaxine XR (EFFEXOR-XR) 150 MG 24 hr capsule Take 2 capsules (300 mg total) by mouth daily with breakfast. 30 capsule 1  . traZODone (DESYREL) 50 MG tablet Take 1 tablet (50 mg total) by mouth at bedtime as needed for sleep. (Patient not taking: Reported on 08/04/2018) 30 tablet 1   No current facility-administered medications for this visit.      PHYSICAL EXAMINATION: ECOG PERFORMANCE STATUS: 0 - Asymptomatic Vitals:   08/04/18 1500  BP: 114/79  Pulse: 90  Resp: 18  Temp: (!) 97.2 F (36.2 C)   Filed Weights   08/04/18 1500  Weight: 188 lb 8 oz (85.5 kg)    Physical Exam  Constitutional: He is oriented to person, place, and time. No distress.  HENT:  Head: Normocephalic and atraumatic.  Right Ear: External ear normal.  Left Ear: External ear normal.  Mouth/Throat: Oropharynx is clear and moist.  Eyes: Pupils are equal, round, and reactive to light. EOM are normal. No scleral icterus.  Neck: Normal range of motion. Neck supple.  Cardiovascular: Normal rate, regular rhythm and normal heart sounds.  Pulmonary/Chest: Effort normal. No respiratory distress. He has no wheezes.  Abdominal: Bowel sounds are normal.  Patient remains quite anxious and nervous during the entire examination.  Not able to relax abdomen.  Limited abdominal examination.  Musculoskeletal: Normal range of motion. He exhibits no edema or deformity.  Neurological: He is alert and oriented to person, place, and time. No cranial nerve deficit. Coordination normal.  Skin: Skin is warm and dry. No rash noted. No erythema.  Psychiatric:  Flat affect, does not interact very well.  Able to answer questions     LABORATORY DATA:  I have reviewed the data as listed Lab Results  Component Value Date   WBC 9.5 06/28/2018   HGB 15.5 06/28/2018   HCT 45.2 06/28/2018    MCV 86.8 06/28/2018   PLT 120 (L) 06/28/2018   Recent Labs    05/06/18 1025 05/07/18 2321 06/28/18 0847  NA 138 139 143  K 3.1* 3.9 4.1  CL 104 102 108  CO2 _0 GLUCOSE 159* 95 114*  BUN _1 CREATININE 0.89 0.94 0.90  CALCIUM 8.4* 8.7* 9.0  GFRNONAA >60 >60 >60  GFRAA >60 >60 >60  PROT 6.8 7.1 7.4  ALBUMIN 4.0 4.2 4.4  AST 35 24 19  ALT 38 32 22  ALKPHOS 68 69 45  BILITOT 1.0 0.6 0.8   Iron/TIBC/Ferritin/ %Sat No results found for: IRON, TIBC, FERRITIN, IRONPCTSAT      ASSESSMENT & PLAN:  1. Thrombocytopenia ([place])    For the work up of patient's thrombocytopenia, I recommend checking CBC;CMP, LDH; smear review, folate, Vitamin B12, hepatitis, HIV,  ANA,  flowcytometry and monoclonal gammopathy workup. Will also check ultrasound of the abdomen. Depakote can also exacerbate his thrombocytopenia.  Also, discussed with the patient that if no clear etiology found- bone marrow biopsy would be suggested. Currently await for the above workup.   # Patient follow-up with me in approximately 2 weeks to review the above results.  Orders Placed This Encounter  Procedures  . US SPLEEN (ABDOMEN LIMITED)    Standing Status:   Future    Standing Expiration Date:   10/05/2019    Order Specific Question:   Reason for Exam (SYMPTOM  OR DIAGNOSIS REQUIRED)    Answer:   thrombocytopenia    Order Specific Question:   Preferred imaging location?    Answer:   [facility]  . CBC with Differential/Platelet    Standing Status:   Future    Number of Occurrences:   1    Standing Expiration Date:   08/05/2019  . Technologist smear review    Standing Status:   Future    Number of Occurrences:   1    Standing Expiration Date:   08/05/2019  . Lactate dehydrogenase    Standing Status:   Future    Number of Occurrences:   1    Standing Expiration Date:   08/05/2019  . Hepatitis panel, acute    Standing Status:   Future    Number of Occurrences:   1    Standing Expiration  Date:   08/05/2019  . HIV antibody    Standing Status:   Future    Number of Occurrences:   1    Standing Expiration Date:   08/05/2019  . Vitamin B12    Standing Status:   Future    Number of Occurrences:   1    Standing Expiration Date:   08/05/2019  . Folate    Standing Status:   Future    Number of Occurrences:   1    Standing Expiration Date:   08/05/2019  . Flow cytometry panel-leukemia/lymphoma work-up    Standing Status:   Future    Number of Occurrences:   1    Standing Expiration Date:   08/05/2019    All questions were answered. The patient knows to call the clinic with any problems questions or concerns.  Return of visit: 2 weeks Thank you for this kind referral and the opportunity to participate in the care of this patient. A copy of today's note is routed to referring provider  Total face to face encounter time for this patient visit was 53mn. >50% of the time was  spent in counseling and coordination of care.    ZEarlie Server MD, PhD Hematology Oncology [facility] APhysicians Care Surgical HospitalPager- [phone]/28/2019

## 2018-08-05 LAB — HIV ANTIBODY (ROUTINE TESTING W REFLEX): HIV Screen 4th Generation wRfx: NONREACTIVE

## 2018-08-05 LAB — HEPATITIS PANEL, ACUTE
HCV Ab: <0.1 s/co ratio (ref 0.0–0.9)
Hep A IgM: NEGATIVE
Hep B C IgM: NEGATIVE
Hepatitis B Surface Ag: NEGATIVE

## 2018-08-10 LAB — COMP PANEL: LEUKEMIA/LYMPHOMA

## 2018-08-11 ENCOUNTER — Ambulatory Visit
Admission: RE | Admit: 2018-08-11 | Discharge: 2018-08-11 | Disposition: A | Discharge status: Home / Self Care | Payer: BLUE CROSS/BLUE SHIELD | Source: Ambulatory Visit | Attending: Oncology | Admitting: Oncology

## 2018-08-11 DIAGNOSIS — D696 Thrombocytopenia, unspecified: Secondary | ICD-10-CM | POA: Diagnosis present

## 2018-08-18 ENCOUNTER — Other Ambulatory Visit: Payer: Self-pay

## 2018-08-18 ENCOUNTER — Inpatient Hospital Stay: Payer: BLUE CROSS/BLUE SHIELD | Attending: Oncology | Admitting: Oncology

## 2018-08-18 ENCOUNTER — Inpatient Hospital Stay: Payer: BLUE CROSS/BLUE SHIELD

## 2018-08-18 ENCOUNTER — Encounter: Payer: Self-pay | Admitting: Oncology

## 2018-08-18 VITALS — BP 114/76 | HR 89 | Temp 97.6°F | Resp 18 | Wt 190.5 lb

## 2018-08-18 DIAGNOSIS — F319 Bipolar disorder, unspecified: Secondary | ICD-10-CM | POA: Diagnosis not present

## 2018-08-18 DIAGNOSIS — D6959 Other secondary thrombocytopenia: Secondary | ICD-10-CM

## 2018-08-18 DIAGNOSIS — F840 Autistic disorder: Secondary | ICD-10-CM | POA: Diagnosis not present

## 2018-08-18 DIAGNOSIS — T50905A Adverse effect of unspecified drugs, medicaments and biological substances, initial encounter: Secondary | ICD-10-CM | POA: Diagnosis not present

## 2018-08-18 DIAGNOSIS — T426X5A Adverse effect of other antiepileptic and sedative-hypnotic drugs, initial encounter: Secondary | ICD-10-CM

## 2018-08-18 DIAGNOSIS — D696 Thrombocytopenia, unspecified: Secondary | ICD-10-CM

## 2018-08-18 LAB — CBC WITH DIFFERENTIAL/PLATELET
Basophils Absolute: 0.1 10*3/uL (ref 0–0.1)
Basophils Relative: 1 %
Eosinophils Absolute: 0.4 10*3/uL (ref 0–0.7)
Eosinophils Relative: 4 %
HCT: 46 % (ref 40.0–52.0)
Hemoglobin: 15.7 g/dL (ref 13.0–18.0)
Lymphocytes Relative: 28 %
Lymphs Abs: 2.6 10*3/uL (ref 1.0–3.6)
MCH: 29.8 pg (ref 26.0–34.0)
MCHC: 34.2 g/dL (ref 32.0–36.0)
MCV: 87.4 fL (ref 80.0–100.0)
Monocytes Absolute: 1 10*3/uL (ref 0.2–1.0)
Monocytes Relative: 10 %
Neutro Abs: 5.3 10*3/uL (ref 1.4–6.5)
Neutrophils Relative %: 57 %
Platelets: 33 10*3/uL — ABNORMAL LOW (ref 150–400)
RBC: 5.27 MIL/uL (ref 4.40–5.90)
RDW: 14.3 % (ref 11.5–14.5)
WBC: 9.3 10*3/uL (ref 3.8–10.6)

## 2018-08-18 NOTE — Progress Notes (Signed)
Patient here for follow up. Per patients wife, patient has been having incontinence issues that started over the summer, resolved for a couple of weeks and restarted last week.

## 2018-08-18 NOTE — Progress Notes (Signed)
Hematology/Oncology Consult note [facility] Telephone:([phone] [phone]   Patient Care Team: Cletis Athens, MD as PCP - General (Internal Medicine)  REFERRING PROVIDER: Cletis Athens, MD CHIEF COMPLAINTS/REASON FOR VISIT:  Evaluation of thrombocytopenia  HISTORY OF PRESENTING ILLNESS:  Steven Knight is a  47 y.o.  male with PMH listed below who was referred to me for evaluation of thrombocytopenia  Patient recently had lab work done on 07/22/2018 which revealed thrombocytopenia, platelet counts 81 with comments at 2 platelet counts may be somewhat higher than reported due to aggregation of platelets in December. WBC 10.2, hemoglobin 14.9, MCV 86, neutrophil absolute 7.3 [normal ref 1.4-7.0] Creatinine 1.13, sodium 144, potassium 4.3, calcium 9.6, albumin 4.5, bilirubin 0.3, alkaline phosphatase 46, AST ALT normal.  Per patient's wife, patient's thrombocytopenia has been chronic.  They were told that patient had thrombocytopenia for about 1 to 2 years.  No bleeding events. No aggravating or improving factors.  Associated symptoms: Patient denies fatigue, weight loss, easy bruising, hema tochezia, hemoptysis.  History hepatitis or HIV infection.  Denies History of chronic liver disease denies Alcohol consumption denies  Patient has autism and bipolar disease.  Poor historian He was recently admitted at the behavior center for bipolar disease.  Depakote was started in June.  INTERVAL HISTORY Steven Knight is a 47 y.o. male who has above history reviewed by me today presents for follow up visit for management of thrombocytopenia.  During the interval, he has had lab work up and presents to discuss lab results. Accompanied by wife No reports of active bleeding. He does have easy bruising.    Review of Systems  Unable to perform ROS: Psychiatric disorder  Constitutional: Negative for fever, malaise/fatigue and weight loss.    HENT: Negative for hearing loss.   Respiratory: Negative for cough.   Cardiovascular: Negative for leg swelling.  Gastrointestinal: Negative for abdominal pain, nausea and vomiting.  Neurological: Negative for dizziness.    MEDICAL HISTORY:  Past Medical History:  Diagnosis Date  . Autism   . Autism   . Bipolar 1 disorder ([place])     SURGICAL HISTORY: History reviewed. No pertinent surgical history.  SOCIAL HISTORY: Social History   Socioeconomic History  . Marital status: Married    Spouse name: Not on file  . Number of children: Not on file  . Years of education: Not on file  . Highest education level: Not on file  Occupational History  . Not on file  Social Needs  . Financial resource strain: Not on file  . Food insecurity:    Worry: Not on file    Inability: Not on file  . Transportation needs:    Medical: Not on file    Non-medical: Not on file  Tobacco Use  . Smoking status: Never Smoker  . Smokeless tobacco: Never Used  Substance and Sexual Activity  . Alcohol use: Never    Frequency: Never  . Drug use: Never  . Sexual activity: Yes  Lifestyle  . Physical activity:    Days per week: Not on file    Minutes per session: Not on file  . Stress: Not on file  Relationships  . Social connections:    Talks on phone: Not on file    Gets together: Not on file    Attends religious service: Not on file    Active member of club or organization: Not on file    Attends meetings of clubs or organizations: Not on file  Relationship status: Not on file  . Intimate partner violence:    Fear of current or ex partner: Not on file    Emotionally abused: Not on file    Physically abused: Not on file    Forced sexual activity: Not on file  Other Topics Concern  . Not on file  Social History Narrative  . Not on file    FAMILY HISTORY: Family History  Problem Relation Age of Onset  . Diabetes Mother     ALLERGIES:  has No Known Allergies.  MEDICATIONS:   Current Outpatient Medications  Medication Sig Dispense Refill  . ARIPiprazole (ABILIFY) 20 MG tablet Take 1 tablet (20 mg total) by mouth daily. 30 tablet 1  . divalproex (DEPAKOTE) 250 MG DR tablet Take 1 tablet (250 mg total) by mouth daily with breakfast. 30 tablet 1  . divalproex (DEPAKOTE) 500 MG DR tablet Take 2 tablets (1,000 mg total) by mouth at bedtime. 60 tablet 1  . mirtazapine (REMERON) 15 MG tablet Take 1 tablet (15 mg total) by mouth at bedtime. 30 tablet 1  . venlafaxine XR (EFFEXOR-XR) 150 MG 24 hr capsule Take 2 capsules (300 mg total) by mouth daily with breakfast. 30 capsule 1  . traZODone (DESYREL) 50 MG tablet Take 1 tablet (50 mg total) by mouth at bedtime as needed for sleep. (Patient not taking: Reported on 08/04/2018) 30 tablet 1   No current facility-administered medications for this visit.      PHYSICAL EXAMINATION: ECOG PERFORMANCE STATUS: 0 - Asymptomatic Vitals:   08/18/18 1013  BP: 114/76  Pulse: 89  Resp: 18  Temp: 97.6 F (36.4 C)   Filed Weights   08/18/18 1013  Weight: 190 lb 8 oz (86.4 kg)    Physical Exam  Constitutional: He is oriented to person, place, and time. He appears well-developed and well-nourished. No distress.  HENT:  Head: Normocephalic and atraumatic.  Right Ear: External ear normal.  Left Ear: External ear normal.  Mouth/Throat: Oropharynx is clear and moist.  Eyes: Pupils are equal, round, and reactive to light. EOM are normal. No scleral icterus.  Neck: Normal range of motion. Neck supple.  Cardiovascular: Normal rate, regular rhythm and normal heart sounds.  Pulmonary/Chest: Effort normal. No respiratory distress. He has no wheezes.  Abdominal: Soft. Bowel sounds are normal. He exhibits no distension and no mass. There is no tenderness.  Patient remains quite anxious and nervous during the entire examination.  Not able to relax abdomen.  Limited abdominal examination.  Musculoskeletal: Normal range of motion. He  exhibits no edema or deformity.  Neurological: He is alert and oriented to person, place, and time. No cranial nerve deficit. Coordination normal.  Skin: Skin is warm and dry. No rash noted. No erythema.  Psychiatric: He has a normal mood and affect. His behavior is normal. Thought content normal.  Flat affect, does not interact very well.  Able to answer questions     LABORATORY DATA:  I have reviewed the data as listed Lab Results  Component Value Date   WBC 9.3 08/18/2018   HGB 15.7 08/18/2018   HCT 46.0 08/18/2018   MCV 87.4 08/18/2018   PLT 33 (L) 08/18/2018   Recent Labs    05/06/18 1025 05/07/18 2321 06/28/18 0847  NA 138 139 143  K 3.1* 3.9 4.1  CL 104 102 108  CO2 _0 GLUCOSE 159* 95 114*  BUN _1 CREATININE 0.89 0.94 0.90  CALCIUM 8.4* 8.7*  9.0  GFRNONAA >60 >60 >60  GFRAA >60 >60 >60  PROT 6.8 7.1 7.4  ALBUMIN 4.0 4.2 4.4  AST 35 24 19  ALT 38 32 22  ALKPHOS 68 69 45  BILITOT 1.0 0.6 0.8   Iron/TIBC/Ferritin/ %Sat No results found for: IRON, TIBC, FERRITIN, IRONPCTSAT      ASSESSMENT & PLAN:  1. Drug-induced thrombocytopenia   2. Valproic acid causing adverse effects in therapeutic use, initial encounter    Labs were reviewed and discussed with patient and his mother,  Normal LDH, negative hepatitis and HIV, normal B12 and folate level, normal spleen size. Negative flowcytometry.   Patient was started on Depakote this June and his chronic thrombocytopenia further declined. Depakote can cause thrombocytopenia.  I called and talked his psychiatrist Dr.Litz and discussed my concern of possible drug induced thrombocytopenia. Dr.Litz agrees with discontinuing Depakote and we will have patient repeat CBC weekly to see if platelet counts recovers. Patient has upcoming appointment with psychiatry and Dr.Litz wants patient/family to call if patient experiences any mood problems prior to his appointment  RN called wife today and advise patient to  discontinue medication and repeat lab [cbc] next week.    Other etiology includes ITP. If drug induced thrombocytopenia is ruled out, can try a course of steroids.  Discussed with the patient that if no clear etiology found- bone marrow biopsy would be suggested. Currently await for the above workup.   Orders Placed This Encounter  Procedures  . CBC with Differential/Platelet    Standing Status:   Future    Standing Expiration Date:   08/19/2019  . CBC with Differential/Platelet    Standing Status:   Future    Number of Occurrences:   1    Standing Expiration Date:   08/19/2019    All questions were answered. The patient knows to call the clinic with any problems questions or concerns.  Return of visit: CBC weekly, see me in 2 weeks Total face to face encounter time for this patient visit was  45 min. >50% of the time was  spent in counseling and coordination of care.   Earlie Server, MD, PhD Hematology Oncology [facility] at [facility] Pager- [phone] 08/18/2018

## 2018-08-19 ENCOUNTER — Telehealth: Payer: Self-pay | Admitting: Oncology

## 2018-08-19 NOTE — Telephone Encounter (Signed)
Called pt and wife to notify about low platelet count and to stop Depakote. Also notified them that Dr. Tasia Catchings wants to see him for labs next week. Notified that if there are any bleeding problems to call office and let us know. Pt will keep scheduled appt with psychiatrist and Dr. Tasia Catchings.

## 2018-08-24 ENCOUNTER — Other Ambulatory Visit: Payer: Self-pay

## 2018-08-25 ENCOUNTER — Other Ambulatory Visit: Payer: Self-pay

## 2018-08-25 ENCOUNTER — Inpatient Hospital Stay: Payer: BLUE CROSS/BLUE SHIELD

## 2018-08-25 DIAGNOSIS — T50905A Adverse effect of unspecified drugs, medicaments and biological substances, initial encounter: Secondary | ICD-10-CM

## 2018-08-25 DIAGNOSIS — T426X5A Adverse effect of other antiepileptic and sedative-hypnotic drugs, initial encounter: Secondary | ICD-10-CM

## 2018-08-25 DIAGNOSIS — D6959 Other secondary thrombocytopenia: Secondary | ICD-10-CM

## 2018-08-25 LAB — CBC WITH DIFFERENTIAL/PLATELET
Basophils Absolute: 0.1 10*3/uL (ref 0–0.1)
Basophils Relative: 1 %
Eosinophils Absolute: 0.4 10*3/uL (ref 0–0.7)
Eosinophils Relative: 4 %
HCT: 43.6 % (ref 40.0–52.0)
Hemoglobin: 15.1 g/dL (ref 13.0–18.0)
Lymphocytes Relative: 28 %
Lymphs Abs: 2.5 10*3/uL (ref 1.0–3.6)
MCH: 30.4 pg (ref 26.0–34.0)
MCHC: 34.6 g/dL (ref 32.0–36.0)
MCV: 87.7 fL (ref 80.0–100.0)
Monocytes Absolute: 0.7 10*3/uL (ref 0.2–1.0)
Monocytes Relative: 8 %
Neutro Abs: 5.1 10*3/uL (ref 1.4–6.5)
Neutrophils Relative %: 59 %
Platelets: 50 10*3/uL — ABNORMAL LOW (ref 150–440)
RBC: 4.98 MIL/uL (ref 4.40–5.90)
RDW: 14 % (ref 11.5–14.5)
WBC: 8.7 10*3/uL (ref 3.8–10.6)

## 2018-09-01 ENCOUNTER — Other Ambulatory Visit: Payer: Self-pay

## 2018-09-01 ENCOUNTER — Inpatient Hospital Stay: Payer: BLUE CROSS/BLUE SHIELD

## 2018-09-01 ENCOUNTER — Inpatient Hospital Stay (HOSPITAL_BASED_OUTPATIENT_CLINIC_OR_DEPARTMENT_OTHER): Payer: BLUE CROSS/BLUE SHIELD | Admitting: Oncology

## 2018-09-01 ENCOUNTER — Encounter: Payer: Self-pay | Admitting: Oncology

## 2018-09-01 VITALS — BP 123/87 | HR 87 | Temp 96.6°F | Resp 18 | Wt 193.5 lb

## 2018-09-01 DIAGNOSIS — T50905A Adverse effect of unspecified drugs, medicaments and biological substances, initial encounter: Secondary | ICD-10-CM

## 2018-09-01 DIAGNOSIS — D696 Thrombocytopenia, unspecified: Secondary | ICD-10-CM

## 2018-09-01 DIAGNOSIS — D6959 Other secondary thrombocytopenia: Secondary | ICD-10-CM

## 2018-09-01 LAB — CBC WITH DIFFERENTIAL/PLATELET
Basophils Absolute: 0.1 10*3/uL (ref 0–0.1)
Basophils Relative: 1 %
Eosinophils Absolute: 0.3 10*3/uL (ref 0–0.7)
Eosinophils Relative: 4 %
HCT: 44.8 % (ref 40.0–52.0)
Hemoglobin: 15.7 g/dL (ref 13.0–18.0)
Lymphocytes Relative: 29 %
Lymphs Abs: 2.6 10*3/uL (ref 1.0–3.6)
MCH: 30.5 pg (ref 26.0–34.0)
MCHC: 35.1 g/dL (ref 32.0–36.0)
MCV: 86.9 fL (ref 80.0–100.0)
Monocytes Absolute: 0.8 10*3/uL (ref 0.2–1.0)
Monocytes Relative: 8 %
Neutro Abs: 5.3 10*3/uL (ref 1.4–6.5)
Neutrophils Relative %: 58 %
Platelets: 52 10*3/uL — ABNORMAL LOW (ref 150–440)
RBC: 5.15 MIL/uL (ref 4.40–5.90)
RDW: 13.8 % (ref 11.5–14.5)
WBC: 9.2 10*3/uL (ref 3.8–10.6)

## 2018-09-01 NOTE — Progress Notes (Signed)
Patient here for follow up. He states he has been doing fine, even while being off of depakote.

## 2018-09-01 NOTE — Progress Notes (Signed)
Hematology/Oncology follow up  note [facility] [phone] Fax:(336) [phone]   Patient Care Team: Cletis Athens, MD as PCP - General (Internal Medicine)  REFERRING PROVIDER: Cletis Athens, MD REASON FOR VISIT Follow up for treatment of thrombocytopenia  HISTORY OF PRESENTING ILLNESS:  Steven Knight is a  47 y.o.  male with PMH listed below who was referred to me for evaluation of thrombocytopenia  Patient recently had lab work done on 07/22/2018 which revealed thrombocytopenia, platelet counts 81 with comments at 2 platelet counts may be somewhat higher than reported due to aggregation of platelets in December. WBC 10.2, hemoglobin 14.9, MCV 86, neutrophil absolute 7.3 [normal ref 1.4-7.0] Creatinine 1.13, sodium 144, potassium 4.3, calcium 9.6, albumin 4.5, bilirubin 0.3, alkaline phosphatase 46, AST ALT normal.  Per patient's wife, patient's thrombocytopenia has been chronic.  They were told that patient had thrombocytopenia for about 1 to 2 years.  No bleeding events. No aggravating or improving factors.  Associated symptoms: Patient denies fatigue, weight loss, easy bruising, hema tochezia, hemoptysis.  History hepatitis or HIV infection.  Denies History of chronic liver disease denies Alcohol consumption denies  Patient has autism and bipolar disease.  Poor historian He was recently admitted at the behavior center for bipolar disease.  Depakote was started in June.  INTERVAL HISTORY Steven Knight is a 47 y.o. male who has above history reviewed by me today presents for management of thrombocytopenia.  During the interval, I have communicated with his psychiatrist and Depakote has been stopped.  He denies any bleeding events. Easy bruising is also better.    Review of Systems  Unable to perform ROS: Psychiatric disorder  Constitutional: Negative for fever, malaise/fatigue and weight loss.  HENT: Negative for hearing  loss.   Respiratory: Negative for cough.   Cardiovascular: Negative for leg swelling.  Gastrointestinal: Negative for abdominal pain, nausea and vomiting.  Neurological: Negative for dizziness.    MEDICAL HISTORY:  Past Medical History:  Diagnosis Date  . Autism   . Autism   . Bipolar 1 disorder ([place])     SURGICAL HISTORY: History reviewed. No pertinent surgical history. No surgery SOCIAL HISTORY: Social History   Socioeconomic History  . Marital status: Married    Spouse name: Not on file  . Number of children: Not on file  . Years of education: Not on file  . Highest education level: Not on file  Occupational History  . Not on file  Social Needs  . Financial resource strain: Not on file  . Food insecurity:    Worry: Not on file    Inability: Not on file  . Transportation needs:    Medical: Not on file    Non-medical: Not on file  Tobacco Use  . Smoking status: Never Smoker  . Smokeless tobacco: Never Used  Substance and Sexual Activity  . Alcohol use: Never    Frequency: Never  . Drug use: Never  . Sexual activity: Yes  Lifestyle  . Physical activity:    Days per week: Not on file    Minutes per session: Not on file  . Stress: Not on file  Relationships  . Social connections:    Talks on phone: Not on file    Gets together: Not on file    Attends religious service: Not on file    Active member of club or organization: Not on file    Attends meetings of clubs or organizations: Not on file    Relationship  status: Not on file  . Intimate partner violence:    Fear of current or ex partner: Not on file    Emotionally abused: Not on file    Physically abused: Not on file    Forced sexual activity: Not on file  Other Topics Concern  . Not on file  Social History Narrative  . Not on file    FAMILY HISTORY: Family History  Problem Relation Age of Onset  . Diabetes Mother     ALLERGIES:  has No Known Allergies.  MEDICATIONS:  Current Outpatient  Medications  Medication Sig Dispense Refill  . mirtazapine (REMERON) 15 MG tablet Take 1 tablet (15 mg total) by mouth at bedtime. 30 tablet 1  . traZODone (DESYREL) 50 MG tablet Take 1 tablet (50 mg total) by mouth at bedtime as needed for sleep. 30 tablet 1  . venlafaxine XR (EFFEXOR-XR) 150 MG 24 hr capsule Take 2 capsules (300 mg total) by mouth daily with breakfast. 30 capsule 1  . ARIPiprazole (ABILIFY) 20 MG tablet Take 1 tablet (20 mg total) by mouth daily. 30 tablet 1  . divalproex (DEPAKOTE) 250 MG DR tablet Take 1 tablet (250 mg total) by mouth daily with breakfast. (Patient not taking: Reported on 09/01/2018) 30 tablet 1  . divalproex (DEPAKOTE) 500 MG DR tablet Take 2 tablets (1,000 mg total) by mouth at bedtime. (Patient not taking: Reported on 09/01/2018) 60 tablet 1   No current facility-administered medications for this visit.      PHYSICAL EXAMINATION: ECOG PERFORMANCE STATUS: 0 - Asymptomatic Vitals:   09/01/18 0954  BP: 123/87  Pulse: 87  Resp: 18  Temp: (!) 96.6 F (35.9 C)   Filed Weights   09/01/18 0954  Weight: 193 lb 8 oz (87.8 kg)    Physical Exam  Constitutional: He is oriented to person, place, and time. No distress.  HENT:  Head: Normocephalic and atraumatic.  Mouth/Throat: Oropharynx is clear and moist.  Eyes: Pupils are equal, round, and reactive to light. EOM are normal. No scleral icterus.  Neck: Normal range of motion. Neck supple.  Cardiovascular: Normal rate, regular rhythm and normal heart sounds.  Pulmonary/Chest: Effort normal. No respiratory distress. He has no wheezes.  Abdominal: Soft. Bowel sounds are normal. He exhibits no distension and no mass. There is no tenderness.  Patient remains quite anxious and nervous during the entire examination.  Not able to relax abdomen.  Limited abdominal examination.  Musculoskeletal: Normal range of motion. He exhibits no edema or deformity.  Neurological: He is alert and oriented to person, place,  and time. No cranial nerve deficit. Coordination normal.  Skin: Skin is warm and dry. No rash noted. No erythema.  Psychiatric: He has a normal mood and affect. His behavior is normal. Thought content normal.  Flat affect, does not interact very well.  Able to answer questions     LABORATORY DATA:  I have reviewed the data as listed Lab Results  Component Value Date   WBC 9.2 09/01/2018   HGB 15.7 09/01/2018   HCT 44.8 09/01/2018   MCV 86.9 09/01/2018   PLT 52 (L) 09/01/2018   Recent Labs    05/06/18 1025 05/07/18 2321 06/28/18 0847  NA 138 139 143  K 3.1* 3.9 4.1  CL 104 102 108  CO2 24 29 28   GLUCOSE 159* 95 114*  BUN 14 17 15   CREATININE 0.89 0.94 0.90  CALCIUM 8.4* 8.7* 9.0  GFRNONAA >60 >60 >60  GFRAA >60 >60 >60  PROT 6.8 7.1 7.4  ALBUMIN 4.0 4.2 4.4  AST 35 24 19  ALT 38 32 22  ALKPHOS 68 69 45  BILITOT 1.0 0.6 0.8   Iron/TIBC/Ferritin/ %Sat No results found for: IRON, TIBC, FERRITIN, IRONPCTSAT    Normal LDH, negative hepatitis and HIV, normal B12 and folate level, normal spleen size. Negative flowcytometry.  ASSESSMENT & PLAN:  1. Drug-induced thrombocytopenia   2. Thrombocytopenia ([place])    Labs are reviewed and discussed with patient.  Depakote has been discontinued.  Platelet counts improved to 52,000.  Continue hold Depakote. Follow up with psychiatry.  Check CBC in 4 weeks. And follow up in clinic with me in 8 weeks.  With patient's permission, I instruct CMA/RN to call patient's wife and update today's recommendation.   All questions were answered. The patient knows to call the clinic with any problems questions or concerns.  Return of visit: 8 weeks.  Total face to face encounter time for this patient visit was 15 min. >50% of the time was  spent in counseling and coordination of care.   Earlie Server, MD, PhD Hematology Oncology [facility] at [facility] Pager- [phone] 09/01/2018

## 2018-09-29 ENCOUNTER — Inpatient Hospital Stay: Payer: BLUE CROSS/BLUE SHIELD | Attending: Oncology

## 2018-09-29 ENCOUNTER — Other Ambulatory Visit: Payer: Self-pay

## 2018-09-29 DIAGNOSIS — D6959 Other secondary thrombocytopenia: Secondary | ICD-10-CM | POA: Insufficient documentation

## 2018-09-29 DIAGNOSIS — T50905A Adverse effect of unspecified drugs, medicaments and biological substances, initial encounter: Secondary | ICD-10-CM

## 2018-09-29 DIAGNOSIS — T426X5A Adverse effect of other antiepileptic and sedative-hypnotic drugs, initial encounter: Secondary | ICD-10-CM

## 2018-09-29 LAB — CBC WITH DIFFERENTIAL/PLATELET
Abs Immature Granulocytes: 0.04 10*3/uL (ref 0.00–0.07)
Basophils Absolute: 0.1 10*3/uL (ref 0.0–0.1)
Basophils Relative: 1 %
Eosinophils Absolute: 0.2 10*3/uL (ref 0.0–0.5)
Eosinophils Relative: 3 %
HCT: 44.2 % (ref 39.0–52.0)
Hemoglobin: 14.6 g/dL (ref 13.0–17.0)
Immature Granulocytes: 1 %
Lymphocytes Relative: 33 %
Lymphs Abs: 2.4 10*3/uL (ref 0.7–4.0)
MCH: 28.9 pg (ref 26.0–34.0)
MCHC: 33 g/dL (ref 30.0–36.0)
MCV: 87.5 fL (ref 80.0–100.0)
Monocytes Absolute: 0.6 10*3/uL (ref 0.1–1.0)
Monocytes Relative: 9 %
Neutro Abs: 3.8 10*3/uL (ref 1.7–7.7)
Neutrophils Relative %: 53 %
Platelets: 47 10*3/uL — ABNORMAL LOW (ref 150–400)
RBC: 5.05 MIL/uL (ref 4.22–5.81)
RDW: 12 % (ref 11.5–15.5)
WBC: 7.2 10*3/uL (ref 4.0–10.5)
nRBC: 0 % (ref 0.0–0.2)

## 2018-10-27 ENCOUNTER — Inpatient Hospital Stay: Payer: BLUE CROSS/BLUE SHIELD | Attending: Oncology

## 2018-10-27 ENCOUNTER — Inpatient Hospital Stay (HOSPITAL_BASED_OUTPATIENT_CLINIC_OR_DEPARTMENT_OTHER): Payer: BLUE CROSS/BLUE SHIELD | Admitting: Oncology

## 2018-10-27 ENCOUNTER — Other Ambulatory Visit: Payer: Self-pay

## 2018-10-27 ENCOUNTER — Encounter: Payer: Self-pay | Admitting: Oncology

## 2018-10-27 VITALS — BP 116/77 | HR 74 | Temp 96.4°F | Resp 14 | Wt 179.4 lb

## 2018-10-27 DIAGNOSIS — T50905A Adverse effect of unspecified drugs, medicaments and biological substances, initial encounter: Secondary | ICD-10-CM | POA: Diagnosis not present

## 2018-10-27 DIAGNOSIS — F319 Bipolar disorder, unspecified: Secondary | ICD-10-CM

## 2018-10-27 DIAGNOSIS — F840 Autistic disorder: Secondary | ICD-10-CM | POA: Insufficient documentation

## 2018-10-27 DIAGNOSIS — D6959 Other secondary thrombocytopenia: Secondary | ICD-10-CM

## 2018-10-27 DIAGNOSIS — Z79899 Other long term (current) drug therapy: Secondary | ICD-10-CM | POA: Insufficient documentation

## 2018-10-27 DIAGNOSIS — T426X5A Adverse effect of other antiepileptic and sedative-hypnotic drugs, initial encounter: Secondary | ICD-10-CM

## 2018-10-27 LAB — CBC WITH DIFFERENTIAL/PLATELET
Abs Immature Granulocytes: 0.04 K/uL (ref 0.00–0.07)
Basophils Absolute: 0.1 K/uL (ref 0.0–0.1)
Basophils Relative: 1 %
Eosinophils Absolute: 0.2 K/uL (ref 0.0–0.5)
Eosinophils Relative: 2 %
HCT: 44.9 % (ref 39.0–52.0)
Hemoglobin: 14.9 g/dL (ref 13.0–17.0)
Immature Granulocytes: 1 %
Lymphocytes Relative: 29 %
Lymphs Abs: 2.2 K/uL (ref 0.7–4.0)
MCH: 29 pg (ref 26.0–34.0)
MCHC: 33.2 g/dL (ref 30.0–36.0)
MCV: 87.5 fL (ref 80.0–100.0)
Monocytes Absolute: 0.7 K/uL (ref 0.1–1.0)
Monocytes Relative: 10 %
Neutro Abs: 4.3 K/uL (ref 1.7–7.7)
Neutrophils Relative %: 57 %
Platelets: 100 K/uL — ABNORMAL LOW (ref 150–400)
RBC: 5.13 MIL/uL (ref 4.22–5.81)
RDW: 11.5 % (ref 11.5–15.5)
WBC: 7.4 K/uL (ref 4.0–10.5)
nRBC: 0 % (ref 0.0–0.2)

## 2018-10-27 NOTE — Progress Notes (Signed)
Patient here today for follow up.  Patient states no new concerns today  

## 2018-10-27 NOTE — Progress Notes (Signed)
Hematology/Oncology follow up  note [facility] Telephone:([phone] Fax:(336) [phone]   Patient Care Team: Cletis Athens, MD as PCP - General (Internal Medicine)  REFERRING PROVIDER: Cletis Athens, MD REASON FOR VISIT Follow up for treatment of thrombocytopenia  HISTORY OF PRESENTING ILLNESS:  Steven Knight is a  47 y.o.  male with PMH listed below who was referred to me for evaluation of thrombocytopenia  Patient recently had lab work done on 07/22/2018 which revealed thrombocytopenia, platelet counts 81 with comments at 2 platelet counts may be somewhat higher than reported due to aggregation of platelets in December. WBC 10.2, hemoglobin 14.9, MCV 86, neutrophil absolute 7.3 [normal ref 1.4-7.0] Creatinine 1.13, sodium 144, potassium 4.3, calcium 9.6, albumin 4.5, bilirubin 0.3, alkaline phosphatase 46, AST ALT normal.  Per patient's wife, patient's thrombocytopenia has been chronic.  They were told that patient had thrombocytopenia for about 1 to 2 years.  No bleeding events. No aggravating or improving factors.  Associated symptoms: Patient denies fatigue, weight loss, easy bruising, hema tochezia, hemoptysis.  History hepatitis or HIV infection.  Denies History of chronic liver disease denies Alcohol consumption denies  Patient has autism and bipolar disease.  Poor historian He was recently admitted at the behavior center for bipolar disease.  Depakote was started in June.  INTERVAL HISTORY Steven Knight is a 47 y.o. male who has above history reviewed by me today presents for follow-up of thrombocytopenia. Per patient, he has come off Depakote.  Denies any active bleeding events.  Easy bruising has resolved.  Review of Systems  Unable to perform ROS: Psychiatric disorder  Constitutional: Negative for fever, malaise/fatigue and weight loss.  HENT: Negative for hearing loss and sore throat.   Respiratory: Negative for cough.     Cardiovascular: Negative for palpitations and leg swelling.  Gastrointestinal: Negative for abdominal pain, nausea and vomiting.  Genitourinary: Negative for urgency.  Skin: Negative for rash.  Neurological: Negative for dizziness, focal weakness and weakness.  Endo/Heme/Allergies: Does not bruise/bleed easily.  Psychiatric/Behavioral: The patient is nervous/anxious.     MEDICAL HISTORY:  Past Medical History:  Diagnosis Date  . Autism   . Autism   . Bipolar 1 disorder ([place])     SURGICAL HISTORY: History reviewed. No pertinent surgical history. No surgery SOCIAL HISTORY: Social History   Socioeconomic History  . Marital status: Married    Spouse name: Not on file  . Number of children: Not on file  . Years of education: Not on file  . Highest education level: Not on file  Occupational History  . Not on file  Social Needs  . Financial resource strain: Not on file  . Food insecurity:    Worry: Not on file    Inability: Not on file  . Transportation needs:    Medical: Not on file    Non-medical: Not on file  Tobacco Use  . Smoking status: Never Smoker  . Smokeless tobacco: Never Used  Substance and Sexual Activity  . Alcohol use: Never    Frequency: Never  . Drug use: Never  . Sexual activity: Yes  Lifestyle  . Physical activity:    Days per week: Not on file    Minutes per session: Not on file  . Stress: Not on file  Relationships  . Social connections:    Talks on phone: Not on file    Gets together: Not on file    Attends religious service: Not on file    Active member  of club or organization: Not on file    Attends meetings of clubs or organizations: Not on file    Relationship status: Not on file  . Intimate partner violence:    Fear of current or ex partner: Not on file    Emotionally abused: Not on file    Physically abused: Not on file    Forced sexual activity: Not on file  Other Topics Concern  . Not on file  Social History Narrative  . Not  on file    FAMILY HISTORY: Family History  Problem Relation Age of Onset  . Diabetes Mother     ALLERGIES:  has No Known Allergies.  MEDICATIONS:  Current Outpatient Medications  Medication Sig Dispense Refill  . ARIPiprazole (ABILIFY) 20 MG tablet Take 1 tablet (20 mg total) by mouth daily. 30 tablet 1  . mirtazapine (REMERON) 15 MG tablet Take 1 tablet (15 mg total) by mouth at bedtime. 30 tablet 1  . traZODone (DESYREL) 50 MG tablet Take 1 tablet (50 mg total) by mouth at bedtime as needed for sleep. 30 tablet 1  . venlafaxine XR (EFFEXOR-XR) 150 MG 24 hr capsule Take 2 capsules (300 mg total) by mouth daily with breakfast. 30 capsule 1   No current facility-administered medications for this visit.      PHYSICAL EXAMINATION: ECOG PERFORMANCE STATUS: 0 - Asymptomatic Vitals:   10/27/18 0842  BP: 116/77  Pulse: 74  Resp: 14  Temp: (!) 96.4 F (35.8 C)   Filed Weights   10/27/18 0842  Weight: 179 lb 7 oz (81.4 kg)    Physical Exam  Constitutional: He is oriented to person, place, and time. No distress.  HENT:  Head: Normocephalic and atraumatic.  Mouth/Throat: Oropharynx is clear and moist.  Eyes: Pupils are equal, round, and reactive to light. EOM are normal. No scleral icterus.  Neck: Normal range of motion. Neck supple.  Cardiovascular: Normal rate, regular rhythm and normal heart sounds.  Pulmonary/Chest: Effort normal. No respiratory distress. He has no wheezes.  Abdominal: Soft. Bowel sounds are normal.  Musculoskeletal: Normal range of motion. He exhibits no edema or deformity.  Neurological: He is alert and oriented to person, place, and time. No cranial nerve deficit. Coordination normal.  Skin: Skin is warm and dry. No rash noted. No erythema.  Psychiatric:  Able to answer questions, anxious     LABORATORY DATA:  I have reviewed the data as listed Lab Results  Component Value Date   WBC 7.4 10/27/2018   HGB 14.9 10/27/2018   HCT 44.9 10/27/2018     MCV 87.5 10/27/2018   PLT 100 (L) 10/27/2018   Recent Labs    05/06/18 1025 05/07/18 2321 06/28/18 0847  NA 138 139 143  K 3.1* 3.9 4.1  CL 104 102 108  CO2 24 29 28   GLUCOSE 159* 95 114*  BUN 14 17 15   CREATININE 0.89 0.94 0.90  CALCIUM 8.4* 8.7* 9.0  GFRNONAA >60 >60 >60  GFRAA >60 >60 >60  PROT 6.8 7.1 7.4  ALBUMIN 4.0 4.2 4.4  AST 35 24 19  ALT 38 32 22  ALKPHOS 68 69 45  BILITOT 1.0 0.6 0.8   Iron/TIBC/Ferritin/ %Sat No results found for: IRON, TIBC, FERRITIN, IRONPCTSAT    Normal LDH, negative hepatitis and HIV, normal B12 and folate level, normal spleen size. Negative flowcytometry.  ASSESSMENT & PLAN:  1. Drug-induced thrombocytopenia    Labs were reviewed and discussed with patient. Since the discontinuation of Depakote,  his platelet counts have improved to 100,000. Continue follow-up with psychiatry. Recommend repeat CBC in 6 months.  The patient knows to call the clinic with any problems questions or concerns.  Return of visit: 6 months Total face to face encounter time for this patient visit was 15 min. >50% of the time was  spent in counseling and coordination of care.   Earlie Server, MD, PhD Hematology Oncology [facility] at [facility] Pager- [phone] 10/27/2018

## 2018-11-24 ENCOUNTER — Other Ambulatory Visit: Payer: Self-pay | Admitting: Acute Care

## 2018-11-24 ENCOUNTER — Ambulatory Visit
Admission: RE | Admit: 2018-11-24 | Discharge: 2018-11-24 | Disposition: A | Discharge status: Home / Self Care | Payer: BLUE CROSS/BLUE SHIELD | Source: Ambulatory Visit | Attending: Acute Care | Admitting: Acute Care

## 2018-11-24 DIAGNOSIS — I639 Cerebral infarction, unspecified: Secondary | ICD-10-CM | POA: Insufficient documentation

## 2018-11-24 MED ORDER — GADOBUTROL 1 MMOL/ML IV SOLN
7.5000 mL | Freq: Once | INTRAVENOUS | Status: AC | PRN
  Administered 2018-11-24: 7.5 mL via INTRAVENOUS

## 2019-01-05 DIAGNOSIS — R569 Unspecified convulsions: Secondary | ICD-10-CM | POA: Insufficient documentation

## 2019-02-07 DIAGNOSIS — R4189 Other symptoms and signs involving cognitive functions and awareness: Secondary | ICD-10-CM | POA: Insufficient documentation

## 2019-02-18 ENCOUNTER — Encounter: Payer: Self-pay | Admitting: Speech Pathology

## 2019-02-18 ENCOUNTER — Other Ambulatory Visit: Payer: Self-pay

## 2019-02-18 ENCOUNTER — Ambulatory Visit: Payer: BLUE CROSS/BLUE SHIELD | Attending: Neurology | Admitting: Speech Pathology

## 2019-02-18 DIAGNOSIS — R41841 Cognitive communication deficit: Secondary | ICD-10-CM

## 2019-02-18 NOTE — Therapy (Signed)
[place] MAIN [facility] SERVICES [address] [place], [place], [address] Phone: [phone]   Fax:  [phone]  Speech Language Pathology Evaluation  Patient Details  Name: Steven Knight MRN: [id number] Date of Birth: [birth year] Referring Provider (SLP): Gurney Maxin   Encounter Date: 02/18/2019  End of Session - 02/18/19 1603    Visit Number  1    Number of Visits  9    Date for SLP Re-Evaluation  03/18/19    SLP Start Time  0800    SLP Stop Time   0900    SLP Time Calculation (min)  60 min    Activity Tolerance  Patient tolerated treatment well       Past Medical History:  Diagnosis Date  . Autism   . Autism   . Bipolar 1 disorder ([place])     History reviewed. No pertinent surgical history.  There were no vitals filed for this visit.      SLP Evaluation [facility] - 02/18/19 0001      SLP Visit Information   SLP Received On  02/18/19    Referring Provider (SLP)  Gurney Maxin    Onset Date  01/26/2019    Medical Diagnosis  Functional speech disorder      Subjective   Subjective  "basically, I'm not talking"    Patient/Family Stated Goal  "Communicate better"      General Information   HPI  The patient is a 48 year old man with cognitive /behavioral changes since December 2019.   The patient has been diagnosed with Asperger's and bipolar disorder.  Per neurology, the patient's complaints could be caused by stress, conversion disorder, or functional speech disorder.      Prior Functional Status   Cognitive/Linguistic Baseline  Within functional limits      Cognition   Overall Cognitive Status  Impaired/Different from baseline      Auditory Comprehension   Overall Auditory Comprehension  Appears within functional limits for tasks assessed      Reading Comprehension   Reading Status  Within funtional limits      Expression   Primary Mode of Expression  Verbal      Verbal Expression   Overall Verbal  Expression  Impaired    Other Verbal Expression Comments  Reduced verbal output      Written Expression   Written Expression  Exceptions to [facility]    Self Formulation Ability  Paragraph;Sentence      Oral Motor/Sensory Function   Overall Oral Motor/Sensory Function  Appears within functional limits for tasks assessed      Motor Speech   Overall Motor Speech  Appears within functional limits for tasks assessed      Standardized Assessments   Standardized Assessments   Montreal Cognitive Assessment (MOCA);Western Aphasia Battery revised      Montreal Cognitive Assessment (MOCA) Version: 8.1 Visuospatieal/Executive Alternating trail making       0/1 Visuoconstruction Skills (copy 3-d design) 1/1 Draw a clock     2/3 Naming     3/3 Attention Forward digit span    1/1 Backward digit span    1/1 Vigilance     1/1 Serial 7's     1/3 Language  Verbal Fluency     0/1 Repetition     0/2 Abstraction     2/2 Delayed Recall    0/5  Memory Index Score  7/15 Orientation     6/6 TOTAL      18/30  Normal  ? 26/30       Western Aphasia Battery- Screening  Spontaneous Speech      Information content   9/10       Fluency    9/10     Comprehension     Yes/No questions   10/10          Sequential Commands  10/10     Repetition    10/10     Naming    Object Naming   10/10       Screening Aphasia Quotient  97/100  Reading    9/10  Writing    9/10 Screening Language Quotient  97/100   SLP Education - 02/18/19 1603    Education Details  Goals of speech therapy    Person(s) Educated  Patient    Methods  Explanation    Comprehension  Verbalized understanding         SLP Long Term Goals - 02/18/19 1605      SLP LONG TERM GOAL #1   Title  Patient will generate grammatical, fluent, and cogent sentences to complete abstract/complex linguistic task with 80% accuracy.    Time  4    Period  Weeks    Status  New    Target Date  03/18/19      SLP LONG TERM GOAL #2   Title   Patient will complete functional writing tasks with 80% accuracy.     Time  4    Period  Weeks    Target Date  03/18/19      SLP LONG TERM GOAL #3   Title  Patient will demonstrate functional cognitive-communication skills for independent completion of personal responsibilities and leisure activities.    Time  4    Period  Weeks    Status  New    Target Date  03/18/19       Plan - 02/18/19 1604    Clinical Impression Statement  The patient is presenting with mild-moderate cognitive communication deficits characterized by reduced verbal/written output, short term memory loss, reduced attention and executive skills.  The patient's stated goal is to improve communication.  He will benefit from skilled speech therapy for restorative and compensatory treatment of communication deficits.    Speech Therapy Frequency  2x / week    Duration  4 weeks    Treatment/Interventions  Language facilitation;Patient/family education    Potential to Achieve Goals  Good    Potential Considerations  Ability to learn/carryover information;Pain level;Family/community support;Co-morbidities;Previous level of function;Cooperation/participation level;Severity of impairments;Medical prognosis    Consulted and Agree with Plan of Care  Patient       Patient will benefit from skilled therapeutic intervention in order to improve the following deficits and impairments:   Cognitive communication deficit - Plan: SLP plan of care cert/re-cert    Problem List Patient Active Problem List   Diagnosis Date Noted  . Bipolar I disorder, current or most recent episode depressed, with psychotic features ([place]) 06/28/2018  . Suicidal ideation 06/28/2018  . Bipolar I disorder, current or most recent episode manic, with psychotic features ([place])    Leroy Sea, MS/CCC- SLP  Lou Miner 02/18/2019, 4:10 PM  [place] [address] [place], [place],  [address] Phone: [phone]   Fax:  [phone]  Name: Steven Knight MRN: [id number] Date of Birth: [birth year]

## 2019-02-21 ENCOUNTER — Ambulatory Visit: Payer: BLUE CROSS/BLUE SHIELD | Admitting: Speech Pathology

## 2019-02-21 ENCOUNTER — Other Ambulatory Visit: Payer: Self-pay

## 2019-02-21 DIAGNOSIS — R41841 Cognitive communication deficit: Secondary | ICD-10-CM | POA: Diagnosis not present

## 2019-02-22 ENCOUNTER — Encounter: Payer: Self-pay | Admitting: Speech Pathology

## 2019-02-22 NOTE — Therapy (Signed)
[place] MAIN [facility] SERVICES [address] [place], [place], [address] Phone: [phone]   Fax:  [phone]  Speech Language Pathology Treatment  Patient Details  Name: Steven Knight MRN: [id number] Date of Birth: 04-17-1971 Referring Provider (SLP): Gurney Maxin   Encounter Date: 02/21/2019  End of Session - 02/22/19 1327    Visit Number  2    Number of Visits  9    Date for SLP Re-Evaluation  03/18/19    SLP Start Time  87    SLP Stop Time   1648    SLP Time Calculation (min)  48 min    Activity Tolerance  Patient tolerated treatment well       Past Medical History:  Diagnosis Date  . Autism   . Autism   . Bipolar 1 disorder ([place])     History reviewed. No pertinent surgical history.  There were no vitals filed for this visit.  Subjective Assessment - 02/22/19 1327    Subjective  The patient is quiet until spoken to            ADULT SLP TREATMENT - 02/22/19 0001      General Information   Behavior/Cognition  Alert;Cooperative;Pleasant mood    HPI  The patient is a 48 year old man with cognitive /behavioral changes since December 2019.   The patient has been diagnosed with Asperger's and bipolar disorder.  Per neurology, the patient's complaints could be caused by stress, conversion disorder, or functional speech disorder.       Treatment Provided   Treatment provided  Cognitive-Linquistic      Pain Assessment   Pain Assessment  No/denies pain      Cognitive-Linquistic Treatment   Treatment focused on  Aphasia;Patient/family/caregiver education    Skilled Treatment  WORD FINDING:  Easily names concrete category given 3 members.  Completes word deduction exercise with 75% accuracy.  EXPRESSIVE LANUGAGE: Generate meaningful sentences to complete problem solving and cause & effect exercises with mod cues to be complete and identify salient information.      Assessment / Recommendations / Plan   Plan   Continue with current plan of care      Progression Toward Goals   Progression toward goals  Progressing toward goals       SLP Education - 02/22/19 1327    Education Details  rationale for selected tasks    Person(s) Educated  Patient    Methods  Explanation    Comprehension  Verbalized understanding         SLP Long Term Goals - 02/18/19 1605      SLP LONG TERM GOAL #1   Title  Patient will generate grammatical, fluent, and cogent sentences to complete abstract/complex linguistic task with 80% accuracy.    Time  4    Period  Weeks    Status  New    Target Date  03/18/19      SLP LONG TERM GOAL #2   Title  Patient will complete functional writing tasks with 80% accuracy.     Time  4    Period  Weeks    Target Date  03/18/19      SLP LONG TERM GOAL #3   Title  Patient will demonstrate functional cognitive-communication skills for independent completion of personal responsibilities and leisure activities.    Time  4    Period  Weeks    Status  New    Target Date  03/18/19  Plan - 02/22/19 1328    Clinical Impression Statement  The patient is completing word finding task well.  He requires more cues to be complete in connected speech tasks.    Speech Therapy Frequency  2x / week    Duration  4 weeks    Treatment/Interventions  Language facilitation;Patient/family education    Potential to Achieve Goals  Good    Potential Considerations  Ability to learn/carryover information;Pain level;Family/community support;Co-morbidities;Previous level of function;Cooperation/participation level;Severity of impairments;Medical prognosis    Consulted and Agree with Plan of Care  Patient       Patient will benefit from skilled therapeutic intervention in order to improve the following deficits and impairments:   Cognitive communication deficit    Problem List Patient Active Problem List   Diagnosis Date Noted  . Bipolar I disorder, current or most recent episode  depressed, with psychotic features ([place]) 06/28/2018  . Suicidal ideation 06/28/2018  . Bipolar I disorder, current or most recent episode manic, with psychotic features ([place])    Leroy Sea, MS/CCC- SLP  Lou Miner 02/22/2019, 1:29 PM  [facility] [address] [place], [place], [address] Phone: [phone]   Fax:  [phone]   Name: Steven Knight MRN: [id number] Date of Birth: [birth year]

## 2019-02-23 ENCOUNTER — Other Ambulatory Visit: Payer: Self-pay

## 2019-02-23 ENCOUNTER — Ambulatory Visit: Payer: BLUE CROSS/BLUE SHIELD | Admitting: Speech Pathology

## 2019-02-23 DIAGNOSIS — R41841 Cognitive communication deficit: Secondary | ICD-10-CM | POA: Diagnosis not present

## 2019-02-24 ENCOUNTER — Encounter: Payer: Self-pay | Admitting: Speech Pathology

## 2019-02-24 NOTE — Therapy (Signed)
[place] MAIN [facility] SERVICES [address] [place], [place], [address] Phone: [phone]   Fax:  [phone]  Speech Language Pathology Treatment  Patient Details  Name: ESPEN BETHEL MRN: [id number] Date of Birth: 08/14/1971 Referring Provider (SLP): Gurney Maxin   Encounter Date: 02/23/2019  End of Session - 02/24/19 0904    Visit Number  3    Number of Visits  9    Date for SLP Re-Evaluation  03/18/19    SLP Start Time  1500    SLP Stop Time   1545    SLP Time Calculation (min)  45 min    Activity Tolerance  Patient tolerated treatment well       Past Medical History:  Diagnosis Date  . Autism   . Autism   . Bipolar 1 disorder ([place])     History reviewed. No pertinent surgical history.  There were no vitals filed for this visit.  Subjective Assessment - 02/24/19 0904    Subjective  The patient is quiet until spoken to            ADULT SLP TREATMENT - 02/24/19 0001      General Information   Behavior/Cognition  Alert;Cooperative;Pleasant mood    HPI  The patient is a 48 year old man with cognitive /behavioral changes since December 2019.   The patient has been diagnosed with Asperger's and bipolar disorder.  Per neurology, the patient's complaints could be caused by stress, conversion disorder, or functional speech disorder.       Treatment Provided   Treatment provided  Cognitive-Linquistic      Pain Assessment   Pain Assessment  No/denies pain      Cognitive-Linquistic Treatment   Treatment focused on  Aphasia;Patient/family/caregiver education    Skilled Treatment  WORD FINDING:  Names concrete category given 3 members with 100% accuracy given cues to clarify vague language.  EXPRESSIVE LANUGAGE: Identify wrong item and explain using specific language with 90% accuracy.  Generate meaningful sentences to complete making inferences exercises with mod cues to be complete and identify salient information.       Assessment / Recommendations / Plan   Plan  Continue with current plan of care      Progression Toward Goals   Progression toward goals  Progressing toward goals       SLP Education - 02/24/19 0904    Education Details  rationale for selected tasks    Person(s) Educated  Patient    Methods  Explanation    Comprehension  Verbalized understanding         SLP Long Term Goals - 02/18/19 1605      SLP LONG TERM GOAL #1   Title  Patient will generate grammatical, fluent, and cogent sentences to complete abstract/complex linguistic task with 80% accuracy.    Time  4    Period  Weeks    Status  New    Target Date  03/18/19      SLP LONG TERM GOAL #2   Title  Patient will complete functional writing tasks with 80% accuracy.     Time  4    Period  Weeks    Target Date  03/18/19      SLP LONG TERM GOAL #3   Title  Patient will demonstrate functional cognitive-communication skills for independent completion of personal responsibilities and leisure activities.    Time  4    Period  Weeks    Status  New  Target Date  03/18/19       Plan - 02/24/19 0131    Clinical Impression Statement  The patient is completing word finding task well.  He requires fewer cues to be complete in connected speech tasks.    Speech Therapy Frequency  2x / week    Duration  4 weeks    Treatment/Interventions  Language facilitation;Patient/family education    Potential to Achieve Goals  Good    Potential Considerations  Ability to learn/carryover information;Pain level;Family/community support;Co-morbidities;Previous level of function;Cooperation/participation level;Severity of impairments;Medical prognosis    Consulted and Agree with Plan of Care  Patient       Patient will benefit from skilled therapeutic intervention in order to improve the following deficits and impairments:   Cognitive communication deficit    Problem List Patient Active Problem List   Diagnosis Date Noted  .  Bipolar I disorder, current or most recent episode depressed, with psychotic features ([place]) 06/28/2018  . Suicidal ideation 06/28/2018  . Bipolar I disorder, current or most recent episode manic, with psychotic features ([place])    Leroy Sea, MS/CCC- SLP  Lou Miner 02/24/2019, 9:06 AM  [facility] [address] [place], [place], [address] Phone: [phone]   Fax:  [phone]   Name: IHAN PAT MRN: [id number] Date of Birth: 1971/08/31

## 2019-03-02 ENCOUNTER — Ambulatory Visit: Payer: BLUE CROSS/BLUE SHIELD | Admitting: Speech Pathology

## 2019-03-04 ENCOUNTER — Encounter: Payer: Self-pay | Admitting: Speech Pathology

## 2019-03-06 ENCOUNTER — Encounter: Payer: Self-pay | Admitting: Speech Pathology

## 2019-03-07 ENCOUNTER — Encounter: Payer: Self-pay | Admitting: Speech Pathology

## 2019-03-08 ENCOUNTER — Encounter: Payer: Self-pay | Admitting: Speech Pathology

## 2019-03-08 NOTE — Therapy (Signed)
[place] MAIN [facility] SERVICES [address] [place], [place], [address] Phone: [phone]   Fax:  [phone]  Patient Details  Name: Steven Knight MRN: [id number] Date of Birth: August 05, 1971 Referring Provider:  No ref. provider found  Encounter Date: 03/08/2019   SLP called patient 02/28/2019 to inform him that the clinic was closed due to the virus.  SLP assured his that he is talking well and should believe in himself.  He was assured that he could call if he has any questions.  Leroy Sea, MS/CCC- SLP  Lou Miner 03/08/2019, 3:40 PM  [place] MAIN [facility] SERVICES [address] [place], [place], [address] Phone: [phone]   Fax:  [phone]

## 2019-03-09 ENCOUNTER — Encounter: Payer: Self-pay | Admitting: Speech Pathology

## 2019-03-14 ENCOUNTER — Encounter: Payer: Self-pay | Admitting: Speech Pathology

## 2019-03-16 ENCOUNTER — Encounter: Payer: Self-pay | Admitting: Speech Pathology

## 2019-03-17 ENCOUNTER — Encounter: Payer: Self-pay | Admitting: Speech Pathology

## 2019-03-17 NOTE — Therapy (Signed)
[place] MAIN [facility] SERVICES [address] [place], [place], [address] Phone: [phone]   Fax:  [phone]  Patient Details  Name: Steven Knight MRN: [id number] Date of Birth: 07/10/1971 Referring Provider:  No ref. provider found  Encounter Date: 03/17/2019   The [facility] outpatient clinics are closed at this time due to the COVID-19 epidemic. The patient was contacted in regards to their therapy services. The patient did not answer the phone.  A message was left to call our office to talk about therapy options.  Leroy Sea, MS/CCC- SLP   Lou Miner 03/17/2019, 3:45 PM  [place] MAIN [facility] SERVICES [address] [place], [place], [address] Phone: [phone]   Fax:  [phone]

## 2019-03-21 ENCOUNTER — Encounter: Payer: Self-pay | Admitting: Speech Pathology

## 2019-03-23 ENCOUNTER — Encounter: Payer: Self-pay | Admitting: Speech Pathology

## 2019-03-28 ENCOUNTER — Encounter: Payer: Self-pay | Admitting: Speech Pathology

## 2019-03-30 ENCOUNTER — Encounter: Payer: Self-pay | Admitting: Speech Pathology

## 2019-04-11 ENCOUNTER — Encounter: Payer: Self-pay | Admitting: Speech Pathology

## 2019-04-13 ENCOUNTER — Encounter: Payer: Self-pay | Admitting: Speech Pathology

## 2019-08-29 ENCOUNTER — Other Ambulatory Visit: Payer: Self-pay | Admitting: Neurology

## 2019-08-29 DIAGNOSIS — G35 Multiple sclerosis: Secondary | ICD-10-CM

## 2019-08-29 NOTE — Progress Notes (Signed)
Patient was interviewed by Myra Gianotti on 9/21./20  Patient instructed on arrival time and procedure instructions.  (Patient does not take blood thinners.)

## 2019-09-01 ENCOUNTER — Ambulatory Visit
Admission: RE | Admit: 2019-09-01 | Discharge: 2019-09-01 | Disposition: A | Discharge status: Home / Self Care | Payer: BC Managed Care – PPO | Source: Ambulatory Visit | Attending: Neurology | Admitting: Neurology

## 2019-09-01 ENCOUNTER — Other Ambulatory Visit: Payer: Self-pay

## 2019-09-01 DIAGNOSIS — G35 Multiple sclerosis: Secondary | ICD-10-CM

## 2019-09-01 DIAGNOSIS — R4189 Other symptoms and signs involving cognitive functions and awareness: Secondary | ICD-10-CM | POA: Insufficient documentation

## 2019-09-01 LAB — CBC
HCT: 47.3 % (ref 39.0–52.0)
Hemoglobin: 15.4 g/dL (ref 13.0–17.0)
MCH: 28.3 pg (ref 26.0–34.0)
MCHC: 32.6 g/dL (ref 30.0–36.0)
MCV: 86.8 fL (ref 80.0–100.0)
Platelets: 106 10*3/uL — ABNORMAL LOW (ref 150–400)
RBC: 5.45 MIL/uL (ref 4.22–5.81)
RDW: 12.9 % (ref 11.5–15.5)
WBC: 7.8 10*3/uL (ref 4.0–10.5)
nRBC: 0 % (ref 0.0–0.2)

## 2019-09-01 LAB — CSF CELL COUNT WITH DIFFERENTIAL
Tube #: 1
Tube #: 4

## 2019-09-01 LAB — PROTIME-INR
INR: 1 (ref 0.8–1.2)
Prothrombin Time: 12.8 seconds (ref 11.4–15.2)

## 2019-09-01 LAB — PROTEIN, CSF: Total  Protein, CSF: 29 mg/dL (ref 15–45)

## 2019-09-01 LAB — GLUCOSE, CSF: Glucose, CSF: 59 mg/dL (ref 40–70)

## 2019-09-01 LAB — APTT: aPTT: 30 seconds (ref 24–36)

## 2019-09-01 LAB — ALBUMIN: Albumin: 4.7 g/dL (ref 3.5–5.0)

## 2019-09-01 MED ORDER — ACETAMINOPHEN 325 MG PO TABS
650.0000 mg | ORAL_TABLET | ORAL | Status: DC | PRN
  Filled 2019-09-01: qty 2

## 2019-09-02 LAB — IGG CSF INDEX
Albumin CSF-mCnc: 16 mg/dL (ref 11–48)
Albumin: 4.6 g/dL (ref 4.0–5.0)
CSF IgG Index: 0.6 (ref 0.0–0.7)
IgG (Immunoglobin G), Serum: 1027 mg/dL (ref 603–1613)
IgG, CSF: 2 mg/dL (ref 0.0–8.6)
IgG/Alb Ratio, CSF: 0.13 (ref 0.00–0.25)

## 2019-09-04 LAB — CSF CULTURE W GRAM STAIN
Culture: NO GROWTH
Gram Stain: NONE SEEN

## 2019-09-04 LAB — HSV(HERPES SMPLX VRS)ABS-I+II(IGG)-CSF: HSV Type I/II Ab, IgG CSF: <0.34 IV (ref <=0.89)

## 2019-09-05 LAB — OLIGOCLONAL BANDS, CSF + SERM

## 2019-09-14 DIAGNOSIS — R768 Other specified abnormal immunological findings in serum: Secondary | ICD-10-CM | POA: Insufficient documentation

## 2019-10-20 DIAGNOSIS — F845 Asperger's syndrome: Secondary | ICD-10-CM | POA: Insufficient documentation

## 2019-10-20 DIAGNOSIS — F061 Catatonic disorder due to known physiological condition: Secondary | ICD-10-CM | POA: Insufficient documentation

## 2019-10-20 DIAGNOSIS — F3170 Bipolar disorder, currently in remission, most recent episode unspecified: Secondary | ICD-10-CM | POA: Insufficient documentation

## 2019-10-24 ENCOUNTER — Encounter: Payer: Self-pay | Admitting: Oncology

## 2019-10-28 ENCOUNTER — Ambulatory Visit: Payer: Self-pay | Admitting: Oncology

## 2019-10-28 ENCOUNTER — Other Ambulatory Visit: Payer: Self-pay

## 2019-11-10 ENCOUNTER — Other Ambulatory Visit: Payer: BC Managed Care – PPO

## 2019-11-10 ENCOUNTER — Ambulatory Visit: Payer: BC Managed Care – PPO | Admitting: Oncology

## 2019-12-19 DIAGNOSIS — F419 Anxiety disorder, unspecified: Secondary | ICD-10-CM | POA: Insufficient documentation

## 2020-02-09 ENCOUNTER — Other Ambulatory Visit: Payer: Self-pay

## 2020-02-09 ENCOUNTER — Ambulatory Visit: Payer: BLUE CROSS/BLUE SHIELD | Attending: Internal Medicine

## 2020-02-09 ENCOUNTER — Ambulatory Visit: Payer: Self-pay

## 2020-02-09 DIAGNOSIS — Z23 Encounter for immunization: Secondary | ICD-10-CM | POA: Insufficient documentation

## 2020-02-09 NOTE — Progress Notes (Signed)
   Covid-19 Vaccination Clinic  Name:  KATRINA BROSH    MRN: [id number] DOB: 1971/11/23  02/09/2020  Mr. Hue was observed post Covid-19 immunization for 15 minutes without incident. He was provided with Vaccine Information Sheet and instruction to access the V-Safe system.   Mr. Krager was instructed to call 911 with any severe reactions post vaccine: Marland Kitchen Difficulty breathing  . Swelling of face and throat  . A fast heartbeat  . A bad rash all over body  . Dizziness and weakness   Immunizations Administered    Name Date Dose VIS Date Route   Pfizer COVID-19 Vaccine 02/09/2020 10:29 AM 0.3 mL 11/18/2019 Intramuscular   Manufacturer: [place]   Lot: [id number]   [place]: [id number]

## 2020-03-01 ENCOUNTER — Ambulatory Visit: Payer: Self-pay | Attending: Internal Medicine

## 2020-03-01 DIAGNOSIS — Z23 Encounter for immunization: Secondary | ICD-10-CM

## 2020-03-01 NOTE — Progress Notes (Signed)
   Covid-19 Vaccination Clinic  Name:  WOLF BOULAY    MRN: [id number] DOB: 05/12/1971  03/01/2020  Mr. Faries was observed post Covid-19 immunization for 15 minutes without incident. He was provided with Vaccine Information Sheet and instruction to access the V-Safe system.   Mr. Leventhal was instructed to call 911 with any severe reactions post vaccine: Marland Kitchen Difficulty breathing  . Swelling of face and throat  . A fast heartbeat  . A bad rash all over body  . Dizziness and weakness   Immunizations Administered    Name Date Dose VIS Date Route   Pfizer COVID-19 Vaccine 03/01/2020  9:22 AM 0.3 mL 11/18/2019 Intramuscular   Manufacturer: [employer]   Lot: [id number]   [place]: [id number]

## 2020-09-04 ENCOUNTER — Telehealth: Payer: Self-pay

## 2020-09-04 NOTE — Telephone Encounter (Signed)
Is it okay to schedule as a new pt?  Thanks,   -Mickel Baas

## 2020-09-04 NOTE — Telephone Encounter (Signed)
Sure

## 2020-09-04 NOTE — Telephone Encounter (Signed)
Copied from [place] [phone]. Topic: General - Other >> Sep 04, 2020  4:02 PM Celene Kras wrote: Reason for CRM: Pts wife called and is requesting to have the pt seen by Fenton Malling due to her being a pt as well. Please advise .

## 2020-09-06 NOTE — Telephone Encounter (Signed)
Appt. Made 09/24/20 at 1:20 p.m.  New patient paperwork mailed.

## 2020-09-24 ENCOUNTER — Encounter: Payer: Self-pay | Admitting: Physician Assistant

## 2020-09-24 ENCOUNTER — Ambulatory Visit (INDEPENDENT_AMBULATORY_CARE_PROVIDER_SITE_OTHER): Payer: BC Managed Care – PPO | Admitting: Physician Assistant

## 2020-09-24 ENCOUNTER — Other Ambulatory Visit: Payer: Self-pay

## 2020-09-24 VITALS — BP 114/72 | HR 75 | Temp 97.7°F | Resp 16 | Ht 73.0 in | Wt 169.2 lb

## 2020-09-24 DIAGNOSIS — F3132 Bipolar disorder, current episode depressed, moderate: Secondary | ICD-10-CM

## 2020-09-24 DIAGNOSIS — F845 Asperger's syndrome: Secondary | ICD-10-CM

## 2020-09-24 DIAGNOSIS — D696 Thrombocytopenia, unspecified: Secondary | ICD-10-CM | POA: Insufficient documentation

## 2020-09-24 DIAGNOSIS — R413 Other amnesia: Secondary | ICD-10-CM

## 2020-09-24 DIAGNOSIS — Z1211 Encounter for screening for malignant neoplasm of colon: Secondary | ICD-10-CM | POA: Diagnosis not present

## 2020-09-24 DIAGNOSIS — R768 Other specified abnormal immunological findings in serum: Secondary | ICD-10-CM

## 2020-09-24 DIAGNOSIS — Z0000 Encounter for general adult medical examination without abnormal findings: Secondary | ICD-10-CM

## 2020-09-24 DIAGNOSIS — Z125 Encounter for screening for malignant neoplasm of prostate: Secondary | ICD-10-CM

## 2020-09-24 DIAGNOSIS — D693 Immune thrombocytopenic purpura: Secondary | ICD-10-CM | POA: Insufficient documentation

## 2020-09-24 NOTE — Progress Notes (Signed)
New patient visit   Patient: Steven Knight   DOB: 03/23/1971   49 y.o. Male  MRN: [id number] Visit Date: 09/24/2020  Today's healthcare provider: Mar Daring, PA-C   Chief Complaint  Patient presents with  . New Patient (Initial Visit)   Subjective    Steven Knight is a 49 y.o. male who presents today as a new patient to establish care. He is establishing from Dr. Lavera Guise. 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  HPI  Patient wants a physical. His wife accompanies him today in the office and is his historian. Patient has a complicated history. Up until 2 years ago patient was healthy and working outside of the home. He does have a history of Asperger's, but had been very high functioning. There is family history of schizophrenia. His wife and daughter had been in a very bad car accident in 2016 and his wife had to go through multiple surgeries and took over 2 years to learn to walk again. He had missed multiple days helping her and in 2019 was let go from his job. He took another job, but the transition did not go well and he became very stressed. He then started having suicidal and paranoid thoughts. He was hospitalized multiple times in behavioral health through the summer of 2019 for medication management. He is still followed by [facility]. They manage his medications. He has a working diagnosis of Bipolar I. He has been stable per his wife, but he has no affect, no emotion. He is responsive with asking questions but is limited with yes and no answers.   He has also been undergoing neurological evaluation as he has had progressive mental decline and progressive dementia since 2019. He has had MRI, EEG, and LP to rule out MS and seizures. He was seeing Dr. Melrose Nakayama, [facility]. He is now seen by Neurology on an as needed basis.   There has been no clear diagnosis and others have been more stumped  due to his young age and aggressive mental decline.   He also has a history of thrombocytopenia. He was followed by Hematology, Dr. Tasia Catchings. Was found to be secondary to Depakote. Depakote was stopped and platelets returned to normal readings.   Has also been evaluated by Rheumatology and Endocrinology for elevated anti-TPO antibodies. He has had normal TSH readings so it is felt he is part of the 20% of population that have elevated antibodies but remain euthyroid.   Past Medical History:  Diagnosis Date  . Anxiety   . Autism   . Autism   . Bipolar 1 disorder ([place])   . Depression    Past Surgical History:  Procedure Laterality Date  . EYE SURGERY     Family Status  Relation Name Status  . Mother  Alive  . Father  Alive  . PGF  (Not Specified)   Family History  Problem Relation Age of Onset  . Diabetes Mother   . Dementia Paternal Grandfather    Social History   Socioeconomic History  . Marital status: Married    Spouse name: Not on file  . Number of children: Not on file  . Years of education: Not on file  . Highest education level: Not on file  Occupational History  . Not on file  Tobacco Use  . Smoking status: Never Smoker  . Smokeless tobacco: Never Used  Vaping Use  .  Vaping Use: Never used  Substance and Sexual Activity  . Alcohol use: Never  . Drug use: Never  . Sexual activity: Yes  Other Topics Concern  . Not on file  Social History Narrative  . Not on file   Social Determinants of Health   Financial Resource Strain:   . Difficulty of Paying Living Expenses: Not on file  Food Insecurity:   . Worried About Charity fundraiser in the Last Year: Not on file  . Ran Out of Food in the Last Year: Not on file  Transportation Needs:   . Lack of Transportation (Medical): Not on file  . Lack of Transportation (Non-Medical): Not on file  Physical Activity:   . Days of Exercise per Week: Not on file  . Minutes of Exercise per Session: Not on file  Stress:     . Feeling of Stress : Not on file  Social Connections:   . Frequency of Communication with Friends and Family: Not on file  . Frequency of Social Gatherings with Friends and Family: Not on file  . Attends Religious Services: Not on file  . Active Member of Clubs or Organizations: Not on file  . Attends Archivist Meetings: Not on file  . Marital Status: Not on file   Outpatient Medications Prior to Visit  Medication Sig  . ARIPiprazole (ABILIFY) 20 MG tablet Take 1 tablet (20 mg total) by mouth daily.  Marland Kitchen LORazepam (ATIVAN) 0.5 MG tablet Take 1 tablet (0.5 mg total) by mouth every 8 (eight) hours as needed for anxiety.  . mirtazapine (REMERON) 30 MG tablet Take 1 tablet (30 mg total) by mouth at bedtime.  . propranolol (INDERAL) 10 MG tablet Take 1 tablet (10 mg total) by mouth 2 (two) times daily.  . [DISCONTINUED] mirtazapine (REMERON) 15 MG tablet Take 1 tablet (15 mg total) by mouth at bedtime.  . [DISCONTINUED] traZODone (DESYREL) 50 MG tablet Take 1 tablet (50 mg total) by mouth at bedtime as needed for sleep.  . [DISCONTINUED] venlafaxine XR (EFFEXOR-XR) 150 MG 24 hr capsule Take 2 capsules (300 mg total) by mouth daily with breakfast.   No facility-administered medications prior to visit.   No Known Allergies  Immunization History  Administered Date(s) Administered  . Influenza-Unspecified 09/02/2018, 08/22/2020  . PFIZER SARS-COV-2 Vaccination 02/09/2020, 03/01/2020    Health Maintenance  Topic Date Due  . TETANUS/TDAP  Never done  . INFLUENZA VACCINE  Completed  . COVID-19 Vaccine  Completed  . Hepatitis C Screening  Completed  . HIV Screening  Completed    Patient Care Team: Mar Daring, PA-C as PCP - General (Family Medicine)  Review of Systems  Constitutional: Positive for appetite change and unexpected weight change.  HENT: Positive for drooling.   Eyes: Negative.   Respiratory: Negative.   Cardiovascular: Negative.   Gastrointestinal:  Negative.   Endocrine: Negative.   Genitourinary: Negative.   Musculoskeletal: Negative.   Skin: Negative.   Allergic/Immunologic: Negative.   Neurological: Negative.   Hematological: Negative.   Psychiatric/Behavioral: Positive for confusion, hallucinations, self-injury (" in the past"), sleep disturbance and suicidal ideas ("in the past"). The patient is nervous/anxious.     Last CBC Lab Results  Component Value Date   WBC 8.2 09/24/2020   HGB 14.1 09/24/2020   HCT 41.3 09/24/2020   MCV 82 09/24/2020   MCH 28.0 09/24/2020   RDW 12.7 09/24/2020   PLT 14 (LL) 98/92/1194   Last metabolic panel Lab Results  Component Value Date   GLUCOSE 87 09/24/2020   NA 144 09/24/2020   K 3.9 09/24/2020   CL 103 09/24/2020   CO2 27 09/24/2020   BUN 13 09/24/2020   CREATININE 0.84 09/24/2020   GFRNONAA 104 09/24/2020   GFRAA 121 09/24/2020   CALCIUM 9.4 09/24/2020   PROT 7.0 09/24/2020   ALBUMIN 4.5 09/24/2020   LABGLOB 2.5 09/24/2020   AGRATIO 1.8 09/24/2020   BILITOT 0.6 09/24/2020   ALKPHOS 78 09/24/2020   AST 16 09/24/2020   ALT 20 09/24/2020   ANIONGAP 7 06/28/2018      Objective    BP 114/72 (BP Location: Left Arm, Patient Position: Sitting, Cuff Size: Normal)   Pulse 75   Temp 97.7 F (36.5 C) (Oral)   Resp 16   Ht 6' 1"  (1.854 m)   Wt 169 lb 3.2 oz (76.7 kg)   BMI 22.32 kg/m  Physical Exam Constitutional:      General: He is not in acute distress.    Appearance: Normal appearance. He is well-developed and normal weight. He is not ill-appearing.  HENT:     Head: Normocephalic and atraumatic.     Right Ear: Tympanic membrane, ear canal and external ear normal.     Left Ear: Tympanic membrane, ear canal and external ear normal.  Eyes:     General: No scleral icterus.       Right eye: No discharge.        Left eye: No discharge.     Extraocular Movements: Extraocular movements intact.     Conjunctiva/sclera: Conjunctivae normal.     Pupils: Pupils are equal,  round, and reactive to light.  Neck:     Thyroid: No thyromegaly.     Trachea: No tracheal deviation.  Cardiovascular:     Rate and Rhythm: Normal rate and regular rhythm.     Pulses: Normal pulses.     Heart sounds: Normal heart sounds. No murmur heard.   Pulmonary:     Effort: Pulmonary effort is normal. No respiratory distress.     Breath sounds: Normal breath sounds. No wheezing or rales.  Chest:     Chest wall: No tenderness.  Abdominal:     General: Abdomen is flat. Bowel sounds are normal. There is no distension.     Palpations: Abdomen is soft. There is no mass.     Tenderness: There is no abdominal tenderness. There is no guarding or rebound.  Musculoskeletal:        General: No tenderness. Normal range of motion.     Cervical back: Normal range of motion and neck supple. No tenderness.     Right lower leg: No edema.     Left lower leg: No edema.  Lymphadenopathy:     Cervical: No cervical adenopathy.  Skin:    General: Skin is warm and dry.     Capillary Refill: Capillary refill takes less than 2 seconds.     Findings: No erythema or rash.  Neurological:     General: No focal deficit present.     Mental Status: He is alert and oriented to person, place, and time. Mental status is at baseline.     Cranial Nerves: No cranial nerve deficit.     Motor: No weakness or abnormal muscle tone.     Coordination: Coordination normal.     Gait: Gait normal.     Deep Tendon Reflexes: Reflexes are normal and symmetric. Reflexes normal.  Psychiatric:  Mood and Affect: Mood normal.        Behavior: Behavior normal.        Thought Content: Thought content normal.        Judgment: Judgment normal.     Depression Screen PHQ 2/9 Scores 09/24/2020  PHQ - 2 Score 6  PHQ- 9 Score 19   Results for orders placed or performed in visit on 09/24/20  CBC w/Diff/Platelet  Result Value Ref Range   WBC 8.2 3.4 - 10.8 x10E3/uL   RBC 5.03 4.14 - 5.80 x10E6/uL   Hemoglobin 14.1  13.0 - 17.7 g/dL   Hematocrit 41.3 37.5 - 51.0 %   MCV 82 79 - 97 fL   MCH 28.0 26.6 - 33.0 pg   MCHC 34.1 31 - 35 g/dL   RDW 12.7 11.6 - 15.4 %   Platelets 14 (LL) 150 - 450 x10E3/uL   Neutrophils 53 Not Estab. %   Lymphs 35 Not Estab. %   Monocytes 8 Not Estab. %   Eos 2 Not Estab. %   Basos 1 Not Estab. %   Neutrophils Absolute 4.4 1.40 - 7.00 x10E3/uL   Lymphocytes Absolute 2.9 0 - 3 x10E3/uL   Monocytes Absolute 0.7 0 - 0 x10E3/uL   EOS (ABSOLUTE) 0.1 0.0 - 0.4 x10E3/uL   Basophils Absolute 0.1 0 - 0 x10E3/uL   Immature Granulocytes 1 Not Estab. %   Immature Grans (Abs) 0.1 0.0 - 0.1 x10E3/uL   Hematology Comments: Note:   Comprehensive Metabolic Panel (CMET)  Result Value Ref Range   Glucose 87 65 - 99 mg/dL   BUN 13 6 - 24 mg/dL   Creatinine, Ser 0.84 0.76 - 1.27 mg/dL   GFR calc non Af Amer 104 >59 mL/min/1.73   GFR calc Af Amer 121 >59 mL/min/1.73   BUN/Creatinine Ratio 15 9 - 20   Sodium 144 134 - 144 mmol/L   Potassium 3.9 3.5 - 5.2 mmol/L   Chloride 103 96 - 106 mmol/L   CO2 27 20 - 29 mmol/L   Calcium 9.4 8.7 - 10.2 mg/dL   Total Protein 7.0 6.0 - 8.5 g/dL   Albumin 4.5 4.0 - 5.0 g/dL   Globulin, Total 2.5 1.5 - 4.5 g/dL   Albumin/Globulin Ratio 1.8 1.2 - 2.2   Bilirubin Total 0.6 0.0 - 1.2 mg/dL   Alkaline Phosphatase 78 44 - 121 IU/L   AST 16 0 - 40 IU/L   ALT 20 0 - 44 IU/L  HgB A1c  Result Value Ref Range   Hgb A1c MFr Bld 5.4 4.8 - 5.6 %   Est. average glucose Bld gHb Est-mCnc 108 mg/dL  Urinalysis, microscopic only  Result Value Ref Range   WBC, UA None seen 0 - 5 /hpf   RBC 0-2 0 - 2 /hpf   Epithelial Cells (non renal) None seen 0 - 10 /hpf   Casts None seen None seen /lpf   Bacteria, UA None seen None seen/Few  Thyroid Panel With TSH  Result Value Ref Range   TSH 1.150 0.450 - 4.500 uIU/mL   T4, Total 9.7 4.5 - 12.0 ug/dL   T3 Uptake Ratio 32 24 - 39 %   Free Thyroxine Index 3.1 1.2 - 4.9  Lipid Panel With LDL/HDL Ratio  Result Value Ref  Range   Cholesterol, Total 123 100 - 199 mg/dL   Triglycerides 254 (H) 0 - 149 mg/dL   HDL 19 (L) >39 mg/dL   VLDL Cholesterol Cal 41 (H)  5 - 40 mg/dL   LDL Chol Calc (NIH) 63 0 - 99 mg/dL   LDL/HDL Ratio 3.3 0.0 - 3.6 ratio  PSA  Result Value Ref Range   Prostate Specific Ag, Serum 1.2 0.0 - 4.0 ng/mL    Assessment & Plan      1. Annual physical exam Normal physical exam today. Will check labs as below and f/u pending lab results. If labs are stable and WNL he will not need to have these rechecked for one year at his next annual physical exam. He is to call the office in the meantime if he has any acute issue, questions or concerns.  2. Colon cancer screening Due for colon cancer screen. No family history of colon cancer. Cologuard ordered.  - Cologuard  3. Prostate cancer screening No symptoms or urinary changes. Will check labs as below and f/u pending results. - PSA  4. Asperger syndrome Stable.  5. Bipolar I disorder, moderate, current or most recent episode depressed, with melancholic features ([place]) Will check labs as below and f/u pending results. Followed by [facility].  - CBC w/Diff/Platelet - Comprehensive Metabolic Panel (CMET) - HgB A1c - Urinalysis, microscopic only - Thyroid Panel With TSH - Lipid Panel With LDL/HDL Ratio  6. Thrombocytopenia ([place]) H/O this due to Depakote. Will check labs as below and f/u pending results.  Labs revealed recurrent thrombocytopenia. Referral placed back to Hematology.  - CBC w/Diff/Platelet - Comprehensive Metabolic Panel (CMET) - HgB A1c - Urinalysis, microscopic only - Thyroid Panel With TSH - Lipid Panel With LDL/HDL Ratio - Ambulatory referral to Hematology  7. Memory changes No distinct diagnosis. Sees Neurology prn.   8. Anti-TPO antibodies present Noted during work up in 2020. Has seen Rheumatology and Endocrinology. Patient is euthyroid. Will monitor labs annually.    Return in about 1 year (around  09/24/2021).     Reynolds Bowl, PA-C, have reviewed all documentation for this visit. The documentation on 09/25/20 for the exam, diagnosis, procedures, and orders are all accurate and complete.   Rubye Beach  [facility] [phone] (phone) [phone] (fax)  [place]

## 2020-09-24 NOTE — Patient Instructions (Signed)
Preventive Care 40-47 Years Old, Male Preventive care refers to lifestyle choices and visits with your health care provider that can promote health and wellness. This includes:  A yearly physical exam. This is also called an annual well check.  Regular dental and eye exams.  Immunizations.  Screening for certain conditions.  Healthy lifestyle choices, such as eating a healthy diet, getting regular exercise, not using drugs or products that contain nicotine and tobacco, and limiting alcohol use. What can I expect for my preventive care visit? Physical exam Your health care provider will check:  Height and weight. These may be used to calculate body mass index (BMI), which is a measurement that tells if you are at a healthy weight.  Heart rate and blood pressure.  Your skin for abnormal spots. Counseling Your health care provider may ask you questions about:  Alcohol, tobacco, and drug use.  Emotional well-being.  Home and relationship well-being.  Sexual activity.  Eating habits.  Work and work environment. What immunizations do I need?  Influenza (flu) vaccine  This is recommended every year. Tetanus, diphtheria, and pertussis (Tdap) vaccine  You may need a Td booster every 10 years. Varicella (chickenpox) vaccine  You may need this vaccine if you have not already been vaccinated. Zoster (shingles) vaccine  You may need this after age 60. Measles, mumps, and rubella (MMR) vaccine  You may need at least one dose of MMR if you were born in 1957 or later. You may also need a second dose. Pneumococcal conjugate (PCV13) vaccine  You may need this if you have certain conditions and were not previously vaccinated. Pneumococcal polysaccharide (PPSV23) vaccine  You may need one or two doses if you smoke cigarettes or if you have certain conditions. Meningococcal conjugate (MenACWY) vaccine  You may need this if you have certain conditions. Hepatitis A vaccine   You may need this if you have certain conditions or if you travel or work in places where you may be exposed to hepatitis A. Hepatitis B vaccine  You may need this if you have certain conditions or if you travel or work in places where you may be exposed to hepatitis B. Haemophilus influenzae type b (Hib) vaccine  You may need this if you have certain risk factors. Human papillomavirus (HPV) vaccine  If recommended by your health care provider, you may need three doses over 6 months. You may receive vaccines as individual doses or as more than one vaccine together in one shot (combination vaccines). Talk with your health care provider about the risks and benefits of combination vaccines. What tests do I need? Blood tests  Lipid and cholesterol levels. These may be checked every 5 years, or more frequently if you are over 50 years old.  Hepatitis C test.  Hepatitis B test. Screening  Lung cancer screening. You may have this screening every year starting at age 55 if you have a 30-pack-year history of smoking and currently smoke or have quit within the past 15 years.  Prostate cancer screening. Recommendations will vary depending on your family history and other risks.  Colorectal cancer screening. All adults should have this screening starting at age 50 and continuing until age 75. Your health care provider may recommend screening at age 45 if you are at increased risk. You will have tests every 1-10 years, depending on your results and the type of screening test.  Diabetes screening. This is done by checking your blood sugar (glucose) after you have not eaten   for a while (fasting). You may have this done every 1-3 years.  Sexually transmitted disease (STD) testing. Follow these instructions at home: Eating and drinking  Eat a diet that includes fresh fruits and vegetables, whole grains, lean protein, and low-fat dairy products.  Take vitamin and mineral supplements as  recommended by your health care provider.  Do not drink alcohol if your health care provider tells you not to drink.  If you drink alcohol: ? Limit how much you have to 0-2 drinks a day. ? Be aware of how much alcohol is in your drink. In the [place] one drink equals one 12 oz bottle of beer (355 mL), one 5 oz glass of wine (148 mL), or one 1 oz glass of hard liquor (44 mL). Lifestyle  Take daily care of your teeth and gums.  Stay active. Exercise for at least 30 minutes on 5 or more days each week.  Do not use any products that contain nicotine or tobacco, such as cigarettes, e-cigarettes, and chewing tobacco. If you need help quitting, ask your health care provider.  If you are sexually active, practice safe sex. Use a condom or other form of protection to prevent STIs (sexually transmitted infections).  Talk with your health care provider about taking a low-dose aspirin every day starting at age 50. What's next?  Go to your health care provider once a year for a well check visit.  Ask your health care provider how often you should have your eyes and teeth checked.  Stay up to date on all vaccines. This information is not intended to replace advice given to you by your health care provider. Make sure you discuss any questions you have with your health care provider. Document Revised: 11/18/2018 Document Reviewed: 11/18/2018 Elsevier Patient Education  2020 Elsevier Inc.  

## 2020-09-25 ENCOUNTER — Telehealth: Payer: Self-pay

## 2020-09-25 DIAGNOSIS — F845 Asperger's syndrome: Secondary | ICD-10-CM | POA: Insufficient documentation

## 2020-09-25 DIAGNOSIS — F3132 Bipolar disorder, current episode depressed, moderate: Secondary | ICD-10-CM | POA: Insufficient documentation

## 2020-09-25 DIAGNOSIS — R413 Other amnesia: Secondary | ICD-10-CM | POA: Insufficient documentation

## 2020-09-25 DIAGNOSIS — R768 Other specified abnormal immunological findings in serum: Secondary | ICD-10-CM | POA: Insufficient documentation

## 2020-09-25 LAB — PSA: Prostate Specific Ag, Serum: 1.2 ng/mL (ref 0.0–4.0)

## 2020-09-25 LAB — COMPREHENSIVE METABOLIC PANEL
ALT: 20 IU/L (ref 0–44)
AST: 16 IU/L (ref 0–40)
Albumin/Globulin Ratio: 1.8 (ref 1.2–2.2)
Albumin: 4.5 g/dL (ref 4.0–5.0)
Alkaline Phosphatase: 78 IU/L (ref 44–121)
BUN/Creatinine Ratio: 15 (ref 9–20)
BUN: 13 mg/dL (ref 6–24)
Bilirubin Total: 0.6 mg/dL (ref 0.0–1.2)
CO2: 27 mmol/L (ref 20–29)
Calcium: 9.4 mg/dL (ref 8.7–10.2)
Chloride: 103 mmol/L (ref 96–106)
Creatinine, Ser: 0.84 mg/dL (ref 0.76–1.27)
GFR calc Af Amer: 121 mL/min/{1.73_m2} (ref >59)
GFR calc non Af Amer: 104 mL/min/{1.73_m2} (ref >59)
Globulin, Total: 2.5 g/dL (ref 1.5–4.5)
Glucose: 87 mg/dL (ref 65–99)
Potassium: 3.9 mmol/L (ref 3.5–5.2)
Sodium: 144 mmol/L (ref 134–144)
Total Protein: 7 g/dL (ref 6.0–8.5)

## 2020-09-25 LAB — CBC WITH DIFFERENTIAL/PLATELET
Basophils Absolute: 0.1 10*3/uL (ref 0.0–0.2)
Basos: 1 %
EOS (ABSOLUTE): 0.1 10*3/uL (ref 0.0–0.4)
Eos: 2 %
Hematocrit: 41.3 % (ref 37.5–51.0)
Hemoglobin: 14.1 g/dL (ref 13.0–17.7)
Immature Grans (Abs): 0.1 10*3/uL (ref 0.0–0.1)
Immature Granulocytes: 1 %
Lymphocytes Absolute: 2.9 10*3/uL (ref 0.7–3.1)
Lymphs: 35 %
MCH: 28 pg (ref 26.6–33.0)
MCHC: 34.1 g/dL (ref 31.5–35.7)
MCV: 82 fL (ref 79–97)
Monocytes Absolute: 0.7 10*3/uL (ref 0.1–0.9)
Monocytes: 8 %
Neutrophils Absolute: 4.4 10*3/uL (ref 1.4–7.0)
Neutrophils: 53 %
Platelets: 14 10*3/uL — CL (ref 150–450)
RBC: 5.03 x10E6/uL (ref 4.14–5.80)
RDW: 12.7 % (ref 11.6–15.4)
WBC: 8.2 10*3/uL (ref 3.4–10.8)

## 2020-09-25 LAB — THYROID PANEL WITH TSH
Free Thyroxine Index: 3.1 (ref 1.2–4.9)
T3 Uptake Ratio: 32 % (ref 24–39)
T4, Total: 9.7 ug/dL (ref 4.5–12.0)
TSH: 1.15 u[IU]/mL (ref 0.450–4.500)

## 2020-09-25 LAB — LIPID PANEL WITH LDL/HDL RATIO
Cholesterol, Total: 123 mg/dL (ref 100–199)
HDL: 19 mg/dL — ABNORMAL LOW (ref >39)
LDL Chol Calc (NIH): 63 mg/dL (ref 0–99)
LDL/HDL Ratio: 3.3 ratio (ref 0.0–3.6)
Triglycerides: 254 mg/dL — ABNORMAL HIGH (ref 0–149)
VLDL Cholesterol Cal: 41 mg/dL — ABNORMAL HIGH (ref 5–40)

## 2020-09-25 LAB — URINALYSIS, MICROSCOPIC ONLY
Bacteria, UA: NONE SEEN
Casts: NONE SEEN /lpf
Epithelial Cells (non renal): NONE SEEN /hpf (ref 0–10)
WBC, UA: NONE SEEN /hpf (ref 0–5)

## 2020-09-25 LAB — HEMOGLOBIN A1C
Est. average glucose Bld gHb Est-mCnc: 108 mg/dL
Hgb A1c MFr Bld: 5.4 % (ref 4.8–5.6)

## 2020-09-25 NOTE — Telephone Encounter (Signed)
Pt returned call, spoke to pt who then asked to relay message to wife, pt remained present. Message read, wife states she will consult with Dr. Tasia Catchings. States one of his medications lowers his PLT count. Read message again to pt and wife to ensure understanding. Verbalizes understanding ,states they will message Dr. Tasia Catchings now. Advised to CB if needed.

## 2020-09-25 NOTE — Telephone Encounter (Signed)
Left another message for patient to call back

## 2020-09-25 NOTE — Telephone Encounter (Signed)
[facility] called office to inform PCP of critical value.  Patient has platelet count of 14.  PCP for patient is out of office today, message given verbally to Dr B and Dr B advises patient is to go directly to the ER and let them know he has platelet count of 14.  He has had low platelets before but never at this critical level.  Triage nurse may give patient message

## 2020-09-26 ENCOUNTER — Telehealth: Payer: Self-pay | Admitting: Oncology

## 2020-09-26 ENCOUNTER — Other Ambulatory Visit: Payer: Self-pay

## 2020-09-26 DIAGNOSIS — D6959 Other secondary thrombocytopenia: Secondary | ICD-10-CM

## 2020-09-26 DIAGNOSIS — T50905A Adverse effect of unspecified drugs, medicaments and biological substances, initial encounter: Secondary | ICD-10-CM

## 2020-09-26 NOTE — Telephone Encounter (Signed)
Per Dr. Tasia Catchings, schedule lab/MD tomorrow and advise pt to go to ER if bleeding. STAT labs cbc,cmp,imm plt fr, b12,folate, LDH, path smer, HT.   Called and spoke to pt's wife, Curly [place], who will be coming with pt tomorrow and she accepts appt for 8:45am lab and MD 9:15. She states that patient is not bleeding.

## 2020-09-26 NOTE — Telephone Encounter (Signed)
Patient last seen on 10/27/2018 for thrombocytopenia and was suppose to f/u in 1 year but he cancelled the f/u appts.  Will get him scheduled.  What labs would you like to order?

## 2020-09-27 ENCOUNTER — Inpatient Hospital Stay: Payer: BC Managed Care – PPO | Attending: Oncology

## 2020-09-27 ENCOUNTER — Other Ambulatory Visit: Payer: Self-pay

## 2020-09-27 ENCOUNTER — Inpatient Hospital Stay (HOSPITAL_BASED_OUTPATIENT_CLINIC_OR_DEPARTMENT_OTHER): Payer: BC Managed Care – PPO | Admitting: Oncology

## 2020-09-27 ENCOUNTER — Other Ambulatory Visit: Payer: Self-pay | Admitting: Oncology

## 2020-09-27 ENCOUNTER — Encounter: Payer: Self-pay | Admitting: Oncology

## 2020-09-27 VITALS — BP 97/67 | HR 70 | Temp 97.0°F | Resp 18 | Wt 168.8 lb

## 2020-09-27 DIAGNOSIS — F319 Bipolar disorder, unspecified: Secondary | ICD-10-CM | POA: Insufficient documentation

## 2020-09-27 DIAGNOSIS — T50905A Adverse effect of unspecified drugs, medicaments and biological substances, initial encounter: Secondary | ICD-10-CM

## 2020-09-27 DIAGNOSIS — D472 Monoclonal gammopathy: Secondary | ICD-10-CM | POA: Insufficient documentation

## 2020-09-27 DIAGNOSIS — D696 Thrombocytopenia, unspecified: Secondary | ICD-10-CM

## 2020-09-27 DIAGNOSIS — D6959 Other secondary thrombocytopenia: Secondary | ICD-10-CM

## 2020-09-27 DIAGNOSIS — Z862 Personal history of diseases of the blood and blood-forming organs and certain disorders involving the immune mechanism: Secondary | ICD-10-CM | POA: Diagnosis not present

## 2020-09-27 DIAGNOSIS — F840 Autistic disorder: Secondary | ICD-10-CM | POA: Insufficient documentation

## 2020-09-27 DIAGNOSIS — R161 Splenomegaly, not elsewhere classified: Secondary | ICD-10-CM

## 2020-09-27 DIAGNOSIS — R778 Other specified abnormalities of plasma proteins: Secondary | ICD-10-CM

## 2020-09-27 DIAGNOSIS — N2889 Other specified disorders of kidney and ureter: Secondary | ICD-10-CM | POA: Insufficient documentation

## 2020-09-27 LAB — CBC WITH DIFFERENTIAL/PLATELET
Abs Immature Granulocytes: 0.11 10*3/uL — ABNORMAL HIGH (ref 0.00–0.07)
Basophils Absolute: 0.1 10*3/uL (ref 0.0–0.1)
Basophils Relative: 1 %
Eosinophils Absolute: 0.1 10*3/uL (ref 0.0–0.5)
Eosinophils Relative: 1 %
HCT: 44 % (ref 39.0–52.0)
Hemoglobin: 14.9 g/dL (ref 13.0–17.0)
Immature Granulocytes: 1 %
Lymphocytes Relative: 41 %
Lymphs Abs: 3.6 10*3/uL (ref 0.7–4.0)
MCH: 28.4 pg (ref 26.0–34.0)
MCHC: 33.9 g/dL (ref 30.0–36.0)
MCV: 84 fL (ref 80.0–100.0)
Monocytes Absolute: 0.7 10*3/uL (ref 0.1–1.0)
Monocytes Relative: 8 %
Neutro Abs: 4.2 10*3/uL (ref 1.7–7.7)
Neutrophils Relative %: 48 %
Platelets: 20 10*3/uL — CL (ref 150–400)
RBC: 5.24 MIL/uL (ref 4.22–5.81)
RDW: 12.6 % (ref 11.5–15.5)
WBC: 8.8 10*3/uL (ref 4.0–10.5)
nRBC: 0 % (ref 0.0–0.2)

## 2020-09-27 LAB — HIV ANTIBODY (ROUTINE TESTING W REFLEX): HIV Screen 4th Generation wRfx: NONREACTIVE

## 2020-09-27 LAB — COMPREHENSIVE METABOLIC PANEL
ALT: 20 U/L (ref 0–44)
AST: 15 U/L (ref 15–41)
Albumin: 4.2 g/dL (ref 3.5–5.0)
Alkaline Phosphatase: 61 U/L (ref 38–126)
Anion gap: 6 (ref 5–15)
BUN: 23 mg/dL — ABNORMAL HIGH (ref 6–20)
CO2: 31 mmol/L (ref 22–32)
Calcium: 9.2 mg/dL (ref 8.9–10.3)
Chloride: 104 mmol/L (ref 98–111)
Creatinine, Ser: 0.93 mg/dL (ref 0.61–1.24)
GFR, Estimated: >60 mL/min (ref >60)
Glucose, Bld: 65 mg/dL — ABNORMAL LOW (ref 70–99)
Potassium: 4.4 mmol/L (ref 3.5–5.1)
Sodium: 141 mmol/L (ref 135–145)
Total Bilirubin: 0.7 mg/dL (ref 0.3–1.2)
Total Protein: 7.4 g/dL (ref 6.5–8.1)

## 2020-09-27 LAB — SAMPLE TO BLOOD BANK

## 2020-09-27 LAB — IMMATURE PLATELET FRACTION: Immature Platelet Fraction: 30.8 % — ABNORMAL HIGH (ref 1.2–8.6)

## 2020-09-27 LAB — FOLATE: Folate: 9.6 ng/mL (ref >5.9)

## 2020-09-27 LAB — PATHOLOGIST SMEAR REVIEW

## 2020-09-27 LAB — LACTATE DEHYDROGENASE: LDH: 129 U/L (ref 98–192)

## 2020-09-27 LAB — VITAMIN B12: Vitamin B-12: 386 pg/mL (ref 180–914)

## 2020-09-27 LAB — HEPATITIS PANEL, ACUTE
HCV Ab: NONREACTIVE
Hep A IgM: NONREACTIVE
Hep B C IgM: NONREACTIVE
Hepatitis B Surface Ag: NONREACTIVE

## 2020-09-27 LAB — C-REACTIVE PROTEIN: CRP: 0.6 mg/dL (ref <1.0)

## 2020-09-27 NOTE — Progress Notes (Signed)
Hematology/Oncology follow up  note [facility] Telephone:(336) [phone] [phone]   Patient Care Team: Rubye Beach as PCP - General (Family Medicine)  REFERRING PROVIDER: Mar Daring, PA-C REASON FOR VISIT Follow up for treatment of thrombocytopenia  HISTORY OF PRESENTING ILLNESS:  Steven Knight is a  49 y.o.  male with PMH listed below who was referred to me for evaluation of thrombocytopenia  Patient recently had lab work done on 07/22/2018 which revealed thrombocytopenia, platelet counts 81 with comments at 2 platelet counts may be somewhat higher than reported due to aggregation of platelets in December. WBC 10.2, hemoglobin 14.9, MCV 86, neutrophil absolute 7.3 [normal ref 1.4-7.0] Creatinine 1.13, sodium 144, potassium 4.3, calcium 9.6, albumin 4.5, bilirubin 0.3, alkaline phosphatase 46, AST ALT normal.  Per patient's wife, patient's thrombocytopenia has been chronic.  They were told that patient had thrombocytopenia for about 1 to 2 years.  No bleeding events. No aggravating or improving factors.  Associated symptoms: Patient denies fatigue, weight loss, easy bruising, hema tochezia, hemoptysis.  History hepatitis or HIV infection.  Denies History of chronic liver disease denies Alcohol consumption denies  Patient has autism and bipolar disease.  Poor historian He was recently admitted at the behavior center for bipolar disease.  Depakote was started in June.  INTERVAL HISTORY Steven Knight is a 49 y.o. male who has above history reviewed by me today presents for follow-up of thrombocytopenia. Patient was last seen by me in 2019 for thrombocytopenia and at that time, his thrombocytopenia was considered to be secondary to Depakote.  He lost follow-up.  Was referred back to reestablish care due to acute drop of platelet counts.  Patient was accompanied by his wife.  He is a poor historian.  He has  cognitive impairment bipolar disease.   Patientis now on Abilify . for about a year.  Off Depakote.   Patient recently had blood work done which showed acute drop of platelet counts to 14,000.  No recent new medication, alcohol use, herbal supplementation. Denies any acute bleeding events.  Patient's wife noticed a small area of bruising on right elbow.    Review of Systems  Unable to perform ROS: Psychiatric disorder  Constitutional: Positive for weight loss. Negative for fever and malaise/fatigue.  Respiratory: Negative for cough.   Cardiovascular: Negative for palpitations and leg swelling.  Gastrointestinal: Negative for nausea and vomiting.  Genitourinary: Negative for urgency.  Skin: Negative for rash.  Neurological: Negative for dizziness.  Endo/Heme/Allergies: Bruises/bleeds easily.  Psychiatric/Behavioral: The patient is not nervous/anxious.     MEDICAL HISTORY:  Past Medical History:  Diagnosis Date  . Anxiety   . Autism   . Autism   . Bipolar 1 disorder ([place])   . Depression     SURGICAL HISTORY: Past Surgical History:  Procedure Laterality Date  . EYE SURGERY     No surgery SOCIAL HISTORY: Social History   Socioeconomic History  . Marital status: Married    Spouse name: Not on file  . Number of children: Not on file  . Years of education: Not on file  . Highest education level: Not on file  Occupational History  . Not on file  Tobacco Use  . Smoking status: Never Smoker  . Smokeless tobacco: Never Used  Vaping Use  . Vaping Use: Never used  Substance and Sexual Activity  . Alcohol use: Never  . Drug use: Never  . Sexual activity: Yes  Other Topics Concern  .  Not on file  Social History Narrative  . Not on file   Social Determinants of Health   Financial Resource Strain:   . Difficulty of Paying Living Expenses: Not on file  Food Insecurity:   . Worried About Charity fundraiser in the Last Year: Not on file  . Ran Out of Food in the Last  Year: Not on file  Transportation Needs:   . Lack of Transportation (Medical): Not on file  . Lack of Transportation (Non-Medical): Not on file  Physical Activity:   . Days of Exercise per Week: Not on file  . Minutes of Exercise per Session: Not on file  Stress:   . Feeling of Stress : Not on file  Social Connections:   . Frequency of Communication with Friends and Family: Not on file  . Frequency of Social Gatherings with Friends and Family: Not on file  . Attends Religious Services: Not on file  . Active Member of Clubs or Organizations: Not on file  . Attends Archivist Meetings: Not on file  . Marital Status: Not on file  Intimate Partner Violence:   . Fear of Current or Ex-Partner: Not on file  . Emotionally Abused: Not on file  . Physically Abused: Not on file  . Sexually Abused: Not on file    FAMILY HISTORY: Family History  Problem Relation Age of Onset  . Diabetes Mother   . Dementia Paternal Grandfather     ALLERGIES:  has No Known Allergies.  MEDICATIONS:  Current Outpatient Medications  Medication Sig Dispense Refill  . ARIPiprazole (ABILIFY) 20 MG tablet Take 1 tablet (20 mg total) by mouth daily. 30 tablet 1  . LORazepam (ATIVAN) 0.5 MG tablet Take 1 tablet (0.5 mg total) by mouth every 8 (eight) hours as needed for anxiety. 90 tablet 1  . mirtazapine (REMERON) 30 MG tablet Take 1 tablet (30 mg total) by mouth at bedtime. 90 tablet 1  . propranolol (INDERAL) 10 MG tablet Take 1 tablet (10 mg total) by mouth 2 (two) times daily.     No current facility-administered medications for this visit.     PHYSICAL EXAMINATION: ECOG PERFORMANCE STATUS: 0 - Asymptomatic Vitals:   09/27/20 0957  BP: 97/67  Pulse: 70  Resp: 18  Temp: (!) 97 F (36.1 C)  SpO2: 96%   Filed Weights   09/27/20 0957  Weight: 168 lb 12.8 oz (76.6 kg)    Physical Exam Constitutional:      General: He is not in acute distress. HENT:     Head: Normocephalic and  atraumatic.  Eyes:     General: No scleral icterus.    Pupils: Pupils are equal, round, and reactive to light.  Cardiovascular:     Rate and Rhythm: Normal rate and regular rhythm.     Heart sounds: Normal heart sounds.  Pulmonary:     Effort: Pulmonary effort is normal. No respiratory distress.     Breath sounds: No wheezing.  Abdominal:     General: Bowel sounds are normal.     Palpations: Abdomen is soft.  Musculoskeletal:        General: No deformity. Normal range of motion.     Cervical back: Normal range of motion and neck supple.  Skin:    General: Skin is warm and dry.     Findings: No erythema or rash.  Neurological:     Mental Status: He is alert and oriented to person, place, and time.  Cranial Nerves: No cranial nerve deficit.     Coordination: Coordination normal.  Psychiatric:     Comments: Able to answer questions, anxious      LABORATORY DATA:  I have reviewed the data as listed Lab Results  Component Value Date   WBC 8.8 09/27/2020   HGB 14.9 09/27/2020   HCT 44.0 09/27/2020   MCV 84.0 09/27/2020   PLT 20 (LL) 09/27/2020   Recent Labs    09/24/20 1420 09/27/20 0850  NA 144 141  K 3.9 4.4  CL 103 104  CO2 27 31  GLUCOSE 87 65*  BUN 13 23*  CREATININE 0.84 0.93  CALCIUM 9.4 9.2  GFRNONAA 104 >60  GFRAA 121  --   PROT 7.0 7.4  ALBUMIN 4.5 4.2  AST 16 15  ALT 20 20  ALKPHOS 78 61  BILITOT 0.6 0.7   Iron/TIBC/Ferritin/ %Sat No results found for: IRON, TIBC, FERRITIN, IRONPCTSAT    Normal LDH, negative hepatitis and HIV, normal B12 and folate level, normal spleen size. Negative flowcytometry. Patient has history of positive anti-TPO.  Patient was seen by rheumatologist last year.  ASSESSMENT & PLAN:  1. Thrombocytopenia (HCC)   2. Splenomegaly   3. Left kidney mass   4. Abnormal SPEP    Labs reviewed and discussed with patient and his wife.  Patient has had previous negative thrombocytopenia work-up in 2019.  At that time  thrombocytopenia was considered to be likely due to Depakote.  He is currently off Depakote and Abilify for about a year.  No palpable lymphadenopathy or splenomegaly on physical examination I recommend to check CBC, CMP, pathology smear, immature platelet fraction, flow cytometry, hepatitis, HIV, B12, folate level.  SPEP, ANA, CRP. Immature platelet fraction was elevated, consistent with an adequate bone marrow response. Suspect peripheral destruction.  Check abdomen ultrasound. Recommend empiric treatment for trials of dexamethasone 66m daily x 4. Repeat cbc.   # Splenomegaly, etiology unknown.  # Left kidney mass, 2.2cm solid appearing mass, recommend MRI abdomen. # Monoclonal B cell lymphocytosis of unknown significance.  # Abnormal SPEP, will need to check immunofixation in the future. Hold off checking off   Return of visit: To be determined  ZEarlie Server MD, PhD Hematology Oncology C[facility] ATexoma Medical CenterPager- [phone]/21/2021

## 2020-09-27 NOTE — Progress Notes (Signed)
Patient here for oncology follow-up appointment, expresses concerns of bruising.

## 2020-09-28 ENCOUNTER — Ambulatory Visit
Admission: RE | Admit: 2020-09-28 | Discharge: 2020-09-28 | Disposition: A | Discharge status: Home / Self Care | Payer: BC Managed Care – PPO | Source: Ambulatory Visit | Attending: Oncology | Admitting: Oncology

## 2020-09-28 ENCOUNTER — Other Ambulatory Visit: Payer: Self-pay

## 2020-09-28 DIAGNOSIS — D696 Thrombocytopenia, unspecified: Secondary | ICD-10-CM | POA: Diagnosis present

## 2020-09-28 LAB — ANA W/REFLEX: Anti Nuclear Antibody (ANA): NEGATIVE

## 2020-09-28 MED ORDER — DEXAMETHASONE 4 MG PO TABS
40.0000 mg | ORAL_TABLET | Freq: Every day | ORAL | 0 refills | Status: DC
Start: 2020-09-28 — End: 2021-11-05

## 2020-09-29 LAB — PROTEIN ELECTROPHORESIS, SERUM
A/G Ratio: 1.4 (ref 0.7–1.7)
Albumin ELP: 4 g/dL (ref 2.9–4.4)
Alpha-1-Globulin: 0.1 g/dL (ref 0.0–0.4)
Alpha-2-Globulin: 0.5 g/dL (ref 0.4–1.0)
Beta Globulin: 0.7 g/dL (ref 0.7–1.3)
Gamma Globulin: 1.5 g/dL (ref 0.4–1.8)
Globulin, Total: 2.9 g/dL (ref 2.2–3.9)
M-Spike, %: 0.3 g/dL — ABNORMAL HIGH
Total Protein ELP: 6.9 g/dL (ref 6.0–8.5)

## 2020-10-01 ENCOUNTER — Telehealth: Payer: Self-pay | Admitting: *Deleted

## 2020-10-01 DIAGNOSIS — D696 Thrombocytopenia, unspecified: Secondary | ICD-10-CM

## 2020-10-01 DIAGNOSIS — N2889 Other specified disorders of kidney and ureter: Secondary | ICD-10-CM

## 2020-10-01 NOTE — Telephone Encounter (Signed)
Called report  IMPRESSION: 1. The spleen appears slightly enlarged. 2. 2.2 cm solid-appearing mass within the left kidney. Further evaluation with MRI is recommended.  These results will be called to the ordering clinician or representative by the Radiologist Assistant, and communication documented in the PACS or [employer].   Electronically Signed   By: Donavan Foil M.D.   On: 09/28/2020 23:11

## 2020-10-01 NOTE — Telephone Encounter (Signed)
Please schedule him to repeat cbc on 10/26 Schedule him to do MRI abdomen w and wo. Reason is kidney mass. Next available.  Follow up TBD Wife is aware about the plan.

## 2020-10-02 ENCOUNTER — Other Ambulatory Visit: Payer: Self-pay

## 2020-10-02 DIAGNOSIS — D696 Thrombocytopenia, unspecified: Secondary | ICD-10-CM

## 2020-10-02 DIAGNOSIS — R161 Splenomegaly, not elsewhere classified: Secondary | ICD-10-CM | POA: Insufficient documentation

## 2020-10-02 LAB — COMP PANEL: LEUKEMIA/LYMPHOMA: Immunophenotypic Profile: 10

## 2020-10-02 NOTE — Telephone Encounter (Signed)
Pts wife called back @ 3:40pm and stated that she can bring him in tomorrow to have his labs drawn. Labs has been sched for 10/03/20  10:45am

## 2020-10-02 NOTE — Addendum Note (Signed)
Addended by: Vanice Sarah on: 10/02/2020 09:42 AM   Modules accepted: Orders

## 2020-10-02 NOTE — Telephone Encounter (Signed)
FYI......Marland KitchenCalled pt get him sched for a lab addon for today, pt stated That he doesn't have transportation but he would make a few ph calls to see if he could find someone to bring him and that he would give me a call back. Pt is aware of his MRI Mri is sched for 10/10/20 @ 1045 pt must arrive at 945 @[facility] /NPO 4hrs  A appt reminder letter will be mailed out.

## 2020-10-02 NOTE — Telephone Encounter (Signed)
MRI ordered entered.  Please get lab today and MRI next available scheduled and inform patient's wife.

## 2020-10-02 NOTE — Telephone Encounter (Signed)
Do you want to have the immunofixation lab drawn as well?

## 2020-10-03 ENCOUNTER — Telehealth: Payer: Self-pay

## 2020-10-03 ENCOUNTER — Inpatient Hospital Stay: Payer: BC Managed Care – PPO

## 2020-10-03 ENCOUNTER — Other Ambulatory Visit: Payer: Self-pay

## 2020-10-03 DIAGNOSIS — D696 Thrombocytopenia, unspecified: Secondary | ICD-10-CM

## 2020-10-03 LAB — CBC WITH DIFFERENTIAL/PLATELET
Abs Immature Granulocytes: 0.27 10*3/uL — ABNORMAL HIGH (ref 0.00–0.07)
Basophils Absolute: 0.1 10*3/uL (ref 0.0–0.1)
Basophils Relative: 0 %
Eosinophils Absolute: 0 10*3/uL (ref 0.0–0.5)
Eosinophils Relative: 0 %
HCT: 42.9 % (ref 39.0–52.0)
Hemoglobin: 14.5 g/dL (ref 13.0–17.0)
Immature Granulocytes: 2 %
Lymphocytes Relative: 14 %
Lymphs Abs: 2.3 10*3/uL (ref 0.7–4.0)
MCH: 28.2 pg (ref 26.0–34.0)
MCHC: 33.8 g/dL (ref 30.0–36.0)
MCV: 83.5 fL (ref 80.0–100.0)
Monocytes Absolute: 1.8 10*3/uL — ABNORMAL HIGH (ref 0.1–1.0)
Monocytes Relative: 11 %
Neutro Abs: 12 10*3/uL — ABNORMAL HIGH (ref 1.7–7.7)
Neutrophils Relative %: 73 %
Platelets: 160 10*3/uL (ref 150–400)
RBC: 5.14 MIL/uL (ref 4.22–5.81)
RDW: 12.8 % (ref 11.5–15.5)
WBC: 16.4 10*3/uL — ABNORMAL HIGH (ref 4.0–10.5)
nRBC: 0 % (ref 0.0–0.2)

## 2020-10-03 NOTE — Telephone Encounter (Signed)
Done.. Pts wife is aware

## 2020-10-03 NOTE — Telephone Encounter (Signed)
Patient's wife is notified of results.  Please schedule for 1 week lab with lab/MD in 2 weeks to discuss results.  Inform patient's wife of appt details.

## 2020-10-03 NOTE — Telephone Encounter (Signed)
-----  Message from Earlie Server, MD sent at 10/03/2020  2:56 PM EDT ----- Please let patient Steven Knight know that platelet count has normalized.  Recommend follow up lab 1 weeks -cbc, hold tubes, multiple myeloma panel, light chain ratio Lab MD- to discuss rest of his labs,  in 2 weeks- cbc hold tubes  Please order

## 2020-10-06 LAB — COLOGUARD: Cologuard: NEGATIVE

## 2020-10-10 ENCOUNTER — Inpatient Hospital Stay: Payer: BC Managed Care – PPO | Attending: Oncology

## 2020-10-10 ENCOUNTER — Other Ambulatory Visit: Payer: Self-pay

## 2020-10-10 ENCOUNTER — Ambulatory Visit
Admission: RE | Admit: 2020-10-10 | Discharge: 2020-10-10 | Disposition: A | Discharge status: Home / Self Care | Payer: BC Managed Care – PPO | Source: Ambulatory Visit | Attending: Oncology | Admitting: Oncology

## 2020-10-10 DIAGNOSIS — D696 Thrombocytopenia, unspecified: Secondary | ICD-10-CM

## 2020-10-10 DIAGNOSIS — N2889 Other specified disorders of kidney and ureter: Secondary | ICD-10-CM | POA: Insufficient documentation

## 2020-10-10 DIAGNOSIS — R161 Splenomegaly, not elsewhere classified: Secondary | ICD-10-CM | POA: Insufficient documentation

## 2020-10-10 DIAGNOSIS — Z79899 Other long term (current) drug therapy: Secondary | ICD-10-CM | POA: Diagnosis not present

## 2020-10-10 DIAGNOSIS — F840 Autistic disorder: Secondary | ICD-10-CM | POA: Diagnosis not present

## 2020-10-10 DIAGNOSIS — F319 Bipolar disorder, unspecified: Secondary | ICD-10-CM | POA: Diagnosis not present

## 2020-10-10 DIAGNOSIS — D472 Monoclonal gammopathy: Secondary | ICD-10-CM | POA: Diagnosis not present

## 2020-10-10 LAB — CBC WITH DIFFERENTIAL/PLATELET
Abs Immature Granulocytes: 0.13 10*3/uL — ABNORMAL HIGH (ref 0.00–0.07)
Basophils Absolute: 0.1 10*3/uL (ref 0.0–0.1)
Basophils Relative: 1 %
Eosinophils Absolute: 0.1 10*3/uL (ref 0.0–0.5)
Eosinophils Relative: 1 %
HCT: 45.8 % (ref 39.0–52.0)
Hemoglobin: 15.2 g/dL (ref 13.0–17.0)
Immature Granulocytes: 1 %
Lymphocytes Relative: 34 %
Lymphs Abs: 3 10*3/uL (ref 0.7–4.0)
MCH: 27.8 pg (ref 26.0–34.0)
MCHC: 33.2 g/dL (ref 30.0–36.0)
MCV: 83.9 fL (ref 80.0–100.0)
Monocytes Absolute: 0.9 10*3/uL (ref 0.1–1.0)
Monocytes Relative: 10 %
Neutro Abs: 4.8 10*3/uL (ref 1.7–7.7)
Neutrophils Relative %: 53 %
Platelets: 47 10*3/uL — ABNORMAL LOW (ref 150–400)
RBC: 5.46 MIL/uL (ref 4.22–5.81)
RDW: 12.7 % (ref 11.5–15.5)
WBC: 9 10*3/uL (ref 4.0–10.5)
nRBC: 0.2 % (ref 0.0–0.2)

## 2020-10-10 LAB — SAMPLE TO BLOOD BANK

## 2020-10-10 MED ORDER — GADOBUTROL 1 MMOL/ML IV SOLN
7.0000 mL | Freq: Once | INTRAVENOUS | Status: AC | PRN
  Administered 2020-10-10: 7 mL via INTRAVENOUS

## 2020-10-11 LAB — MULTIPLE MYELOMA PANEL, SERUM
Albumin SerPl Elph-Mcnc: 3.7 g/dL (ref 2.9–4.4)
Albumin/Glob SerPl: 1.2 (ref 0.7–1.7)
Alpha 1: 0.2 g/dL (ref 0.0–0.4)
Alpha2 Glob SerPl Elph-Mcnc: 0.7 g/dL (ref 0.4–1.0)
B-Globulin SerPl Elph-Mcnc: 0.9 g/dL (ref 0.7–1.3)
Gamma Glob SerPl Elph-Mcnc: 1.6 g/dL (ref 0.4–1.8)
Globulin, Total: 3.3 g/dL (ref 2.2–3.9)
IgA: 153 mg/dL (ref 90–386)
IgG (Immunoglobin G), Serum: 1074 mg/dL (ref 603–1613)
IgM (Immunoglobulin M), Srm: 617 mg/dL — ABNORMAL HIGH (ref 20–172)
M Protein SerPl Elph-Mcnc: 0.3 g/dL — ABNORMAL HIGH
Total Protein ELP: 7 g/dL (ref 6.0–8.5)

## 2020-10-11 LAB — KAPPA/LAMBDA LIGHT CHAINS
Kappa free light chain: 52.7 mg/L — ABNORMAL HIGH (ref 3.3–19.4)
Kappa, lambda light chain ratio: 2.37 — ABNORMAL HIGH (ref 0.26–1.65)
Lambda free light chains: 22.2 mg/L (ref 5.7–26.3)

## 2020-10-12 ENCOUNTER — Telehealth: Payer: Self-pay

## 2020-10-12 ENCOUNTER — Inpatient Hospital Stay (HOSPITAL_BASED_OUTPATIENT_CLINIC_OR_DEPARTMENT_OTHER): Payer: BC Managed Care – PPO | Admitting: Oncology

## 2020-10-12 ENCOUNTER — Encounter: Payer: Self-pay | Admitting: Oncology

## 2020-10-12 ENCOUNTER — Other Ambulatory Visit: Payer: Self-pay

## 2020-10-12 VITALS — BP 101/65 | HR 70 | Temp 96.9°F | Resp 18 | Wt 167.9 lb

## 2020-10-12 DIAGNOSIS — D472 Monoclonal gammopathy: Secondary | ICD-10-CM | POA: Diagnosis not present

## 2020-10-12 DIAGNOSIS — D696 Thrombocytopenia, unspecified: Secondary | ICD-10-CM | POA: Diagnosis not present

## 2020-10-12 DIAGNOSIS — R161 Splenomegaly, not elsewhere classified: Secondary | ICD-10-CM

## 2020-10-12 DIAGNOSIS — N2889 Other specified disorders of kidney and ureter: Secondary | ICD-10-CM

## 2020-10-12 NOTE — Progress Notes (Signed)
Pt here for follow up. No new concerns voiced.   

## 2020-10-12 NOTE — Telephone Encounter (Signed)
Patient scheduled for BM bx on 10/19/20 @ 8:30a arrive 7:30. Please schedule MD visit 1 week after biopsy. Also, schedule lab only on 11/10 (cbc,hold tube). I will call pt with appts once schdueld.

## 2020-10-12 NOTE — Telephone Encounter (Signed)
Patient's wife notified of all appointments.

## 2020-10-12 NOTE — Telephone Encounter (Signed)
Done.. Pt will RTC on 10/26/20 @ 1045 for MD visit 1 week after biopsy.

## 2020-10-12 NOTE — Progress Notes (Signed)
Hematology/Oncology follow up  note [facility] Telephone:(336) [phone] Fax:(336) [phone]   Patient Care Team: Mar Daring, PA-C as PCP - General (Family Medicine) Earlie Server, MD as Consulting Physician (Hematology and Oncology)  REFERRING PROVIDER: Mar Daring, PA-C REASON FOR VISIT Follow up for treatment of thrombocytopenia  HISTORY OF PRESENTING ILLNESS:  Steven Knight is a  49 y.o.  male with PMH listed below who was referred to me for evaluation of thrombocytopenia  Patient recently had lab work done on 07/22/2018 which revealed thrombocytopenia, platelet counts 81 with comments at 2 platelet counts may be somewhat higher than reported due to aggregation of platelets in December. WBC 10.2, hemoglobin 14.9, MCV 86, neutrophil absolute 7.3 [normal ref 1.4-7.0] Creatinine 1.13, sodium 144, potassium 4.3, calcium 9.6, albumin 4.5, bilirubin 0.3, alkaline phosphatase 46, AST ALT normal.  Per patient's wife, patient's thrombocytopenia has been chronic.  They were told that patient had thrombocytopenia for about 1 to 2 years.  No bleeding events. No aggravating or improving factors.  Associated symptoms: Patient denies fatigue, weight loss, easy bruising, hema tochezia, hemoptysis.  History hepatitis or HIV infection.  Denies History of chronic liver disease denies Alcohol consumption denies  Patient has autism and bipolar disease.  Poor historian Patientis now on Abilify . for about a year.  Off Depakote.   History is obtained from wife.    INTERVAL HISTORY Steven Knight is a 49 y.o. male who has above history reviewed by me today presents for follow-up of thrombocytopenia. #During the interval patient has had a course of 4 days of dexamethasone and the platelet count normalized to 160.000 However dropped back to 47,000. Additional work-up also showed SPEP positive for monoclonal protein 0.3, Ultrasound abdomen showed  slight splenomegaly.  Kidney mass which was further characterized to be likely hemorrhagic cyst although not definitively characterized due to motion artifact.  Numerous tiny T2 hyperintensities seen in each kidney which cannot be further characterized but are likely benign.  13 mm lesion in the anterior right liver possible hemangioma but incompletely characterized.  Patient presents to discuss further management plan.  Denies any bleeding events.  Review of Systems  Unable to perform ROS: Psychiatric disorder  Constitutional: Positive for weight loss. Negative for fever and malaise/fatigue.  Respiratory: Negative for cough.   Cardiovascular: Negative for palpitations and leg swelling.  Gastrointestinal: Negative for nausea and vomiting.  Genitourinary: Negative for urgency.  Skin: Negative for rash.  Neurological: Negative for dizziness.  Endo/Heme/Allergies: Bruises/bleeds easily.  Psychiatric/Behavioral: The patient is not nervous/anxious.     MEDICAL HISTORY:  Past Medical History:  Diagnosis Date  . Anxiety   . Autism   . Autism   . Bipolar 1 disorder ([place])   . Depression     SURGICAL HISTORY: Past Surgical History:  Procedure Laterality Date  . EYE SURGERY     No surgery SOCIAL HISTORY: Social History   Socioeconomic History  . Marital status: Married    Spouse name: Not on file  . Number of children: Not on file  . Years of education: Not on file  . Highest education level: Not on file  Occupational History  . Not on file  Tobacco Use  . Smoking status: Never Smoker  . Smokeless tobacco: Never Used  Vaping Use  . Vaping Use: Never used  Substance and Sexual Activity  . Alcohol use: Never  . Drug use: Never  . Sexual activity: Yes  Other Topics Concern  . Not  on file  Social History Narrative  . Not on file   Social Determinants of Health   Financial Resource Strain:   . Difficulty of Paying Living Expenses: Not on file  Food Insecurity:   .  Worried About Charity fundraiser in the Last Year: Not on file  . Ran Out of Food in the Last Year: Not on file  Transportation Needs:   . Lack of Transportation (Medical): Not on file  . Lack of Transportation (Non-Medical): Not on file  Physical Activity:   . Days of Exercise per Week: Not on file  . Minutes of Exercise per Session: Not on file  Stress:   . Feeling of Stress : Not on file  Social Connections:   . Frequency of Communication with Friends and Family: Not on file  . Frequency of Social Gatherings with Friends and Family: Not on file  . Attends Religious Services: Not on file  . Active Member of Clubs or Organizations: Not on file  . Attends Archivist Meetings: Not on file  . Marital Status: Not on file  Intimate Partner Violence:   . Fear of Current or Ex-Partner: Not on file  . Emotionally Abused: Not on file  . Physically Abused: Not on file  . Sexually Abused: Not on file    FAMILY HISTORY: Family History  Problem Relation Age of Onset  . Diabetes Mother   . Dementia Paternal Grandfather     ALLERGIES:  has No Known Allergies.  MEDICATIONS:  Current Outpatient Medications  Medication Sig Dispense Refill  . ARIPiprazole (ABILIFY) 20 MG tablet Take 1 tablet (20 mg total) by mouth daily. 30 tablet 1  . dexamethasone (DECADRON) 4 MG tablet Take 10 tablets (40 mg total) by mouth daily. 40 tablet 0  . LORazepam (ATIVAN) 0.5 MG tablet Take 1 tablet (0.5 mg total) by mouth every 8 (eight) hours as needed for anxiety. 90 tablet 1  . mirtazapine (REMERON) 30 MG tablet Take 1 tablet (30 mg total) by mouth at bedtime. 90 tablet 1  . propranolol (INDERAL) 10 MG tablet Take 1 tablet (10 mg total) by mouth 2 (two) times daily.     No current facility-administered medications for this visit.     PHYSICAL EXAMINATION: ECOG PERFORMANCE STATUS: 0 - Asymptomatic Vitals:   10/12/20 0929  BP: 101/65  Pulse: 70  Resp: 18  Temp: (!) 96.9 F (36.1 C)    Filed Weights   10/12/20 0929  Weight: 167 lb 14.4 oz (76.2 kg)    Physical Exam Constitutional:      General: He is not in acute distress. HENT:     Head: Normocephalic and atraumatic.  Eyes:     General: No scleral icterus.    Pupils: Pupils are equal, round, and reactive to light.  Cardiovascular:     Rate and Rhythm: Normal rate and regular rhythm.     Heart sounds: Normal heart sounds.  Pulmonary:     Effort: Pulmonary effort is normal. No respiratory distress.     Breath sounds: No wheezing.  Abdominal:     General: Bowel sounds are normal.     Palpations: Abdomen is soft.  Musculoskeletal:        General: No deformity. Normal range of motion.     Cervical back: Normal range of motion and neck supple.  Skin:    General: Skin is warm and dry.     Findings: No erythema or rash.  Neurological:  Mental Status: He is alert and oriented to person, place, and time.     Cranial Nerves: No cranial nerve deficit.     Coordination: Coordination normal.  Psychiatric:     Comments: Able to answer questions, anxious      LABORATORY DATA:  I have reviewed the data as listed Lab Results  Component Value Date   WBC 9.0 10/10/2020   HGB 15.2 10/10/2020   HCT 45.8 10/10/2020   MCV 83.9 10/10/2020   PLT 47 (L) 10/10/2020   Recent Labs    09/24/20 1420 09/27/20 0850  NA 144 141  K 3.9 4.4  CL 103 104  CO2 27 31  GLUCOSE 87 65*  BUN 13 23*  CREATININE 0.84 0.93  CALCIUM 9.4 9.2  GFRNONAA 104 >60  GFRAA 121  --   PROT 7.0 7.4  ALBUMIN 4.5 4.2  AST 16 15  ALT 20 20  ALKPHOS 78 61  BILITOT 0.6 0.7   Iron/TIBC/Ferritin/ %Sat No results found for: IRON, TIBC, FERRITIN, IRONPCTSAT    Normal LDH, negative hepatitis and HIV, normal B12 and folate level, normal spleen size. Negative flowcytometry. Patient has history of positive anti-TPO.  Patient was seen by rheumatologist last year.  ASSESSMENT & PLAN:  1. Thrombocytopenia ([place])   2. Left kidney mass    3. Splenomegaly   4. MGUS (monoclonal gammopathy of unknown significance)    #Thrombocytopenia Labs reviewed and discussed with patient and his wife.  Patient has had previous negative thrombocytopenia work-up in 2019.  At that time thrombocytopenia was considered to be likely due to Depakote.  He is currently off Depakote and Abilify for about a year.  No palpable lymphadenopathy or splenomegaly on physical examination I recommend to check CBC, CMP, pathology smear, immature platelet fraction, flow cytometry, hepatitis, HIV, B12, folate level.  SPEP, ANA, CRP. Immature platelet fraction was elevated, consistent with an adequate bone marrow response.  Ultrasound and MRI findings were discussed with patient and wife. SPEP positive for M protein likely MGUS. Recommend patient to proceed with bone marrow biopsy for further evaluation. Platelet count is decreased to 47,000, still above 30,000, I will hold off rechallenge of any steroids at this point. If further decrease below 30,000, consider rechallenge with steroids plus minus rituximab.  # Left kidney mass, 2.2cm solid appearing mass, likely hemorrhagic cyst however not completely characterized due to motion artifact during MRI.  Will need to follow-up with CT scans in the future. # Monoclonal B cell lymphocytosis of unknown significance.  Pending above work-up.  Bone marrow biopsy.  Return of visit: 1 week after bone marrow biopsy  Earlie Server, MD, PhD Hematology Oncology [facility] Pager- [phone] 10/12/2020

## 2020-10-16 NOTE — Progress Notes (Signed)
Patient on schedule for BMB 10/19/2020. Called and spoke with wife/Juanita on phone with pre procedure instructions given. Made aware to be here @ 0730, NPO after MN prior to procedure,and driver for discharge post procedure/recovery.stated understanding.

## 2020-10-17 ENCOUNTER — Inpatient Hospital Stay: Payer: BC Managed Care – PPO

## 2020-10-17 ENCOUNTER — Other Ambulatory Visit: Payer: Self-pay

## 2020-10-17 DIAGNOSIS — D696 Thrombocytopenia, unspecified: Secondary | ICD-10-CM

## 2020-10-17 LAB — CBC WITH DIFFERENTIAL/PLATELET
Abs Immature Granulocytes: 0.07 10*3/uL (ref 0.00–0.07)
Basophils Absolute: 0.1 10*3/uL (ref 0.0–0.1)
Basophils Relative: 1 %
Eosinophils Absolute: 0.1 10*3/uL (ref 0.0–0.5)
Eosinophils Relative: 1 %
HCT: 42.1 % (ref 39.0–52.0)
Hemoglobin: 14.1 g/dL (ref 13.0–17.0)
Immature Granulocytes: 1 %
Lymphocytes Relative: 38 %
Lymphs Abs: 3.4 10*3/uL (ref 0.7–4.0)
MCH: 28 pg (ref 26.0–34.0)
MCHC: 33.5 g/dL (ref 30.0–36.0)
MCV: 83.5 fL (ref 80.0–100.0)
Monocytes Absolute: 0.9 10*3/uL (ref 0.1–1.0)
Monocytes Relative: 10 %
Neutro Abs: 4.5 10*3/uL (ref 1.7–7.7)
Neutrophils Relative %: 49 %
Platelets: 36 10*3/uL — ABNORMAL LOW (ref 150–400)
RBC: 5.04 MIL/uL (ref 4.22–5.81)
RDW: 12.9 % (ref 11.5–15.5)
WBC: 8.9 10*3/uL (ref 4.0–10.5)
nRBC: 0 % (ref 0.0–0.2)

## 2020-10-17 LAB — SAMPLE TO BLOOD BANK

## 2020-10-18 ENCOUNTER — Ambulatory Visit: Payer: BC Managed Care – PPO | Admitting: Oncology

## 2020-10-18 ENCOUNTER — Other Ambulatory Visit: Payer: BC Managed Care – PPO

## 2020-10-18 ENCOUNTER — Other Ambulatory Visit: Payer: Self-pay | Admitting: Student

## 2020-10-19 ENCOUNTER — Other Ambulatory Visit: Payer: Self-pay

## 2020-10-19 ENCOUNTER — Telehealth: Payer: Self-pay

## 2020-10-19 ENCOUNTER — Ambulatory Visit
Admission: RE | Admit: 2020-10-19 | Discharge: 2020-10-19 | Disposition: A | Discharge status: Home / Self Care | Payer: BC Managed Care – PPO | Source: Ambulatory Visit | Attending: Oncology | Admitting: Oncology

## 2020-10-19 DIAGNOSIS — Z7952 Long term (current) use of systemic steroids: Secondary | ICD-10-CM | POA: Diagnosis not present

## 2020-10-19 DIAGNOSIS — D696 Thrombocytopenia, unspecified: Secondary | ICD-10-CM | POA: Insufficient documentation

## 2020-10-19 DIAGNOSIS — D72829 Elevated white blood cell count, unspecified: Secondary | ICD-10-CM | POA: Insufficient documentation

## 2020-10-19 DIAGNOSIS — Z79899 Other long term (current) drug therapy: Secondary | ICD-10-CM | POA: Insufficient documentation

## 2020-10-19 LAB — CBC WITH DIFFERENTIAL/PLATELET
Abs Immature Granulocytes: 0.08 10*3/uL — ABNORMAL HIGH (ref 0.00–0.07)
Basophils Absolute: 0.1 10*3/uL (ref 0.0–0.1)
Basophils Relative: 1 %
Eosinophils Absolute: 0.1 10*3/uL (ref 0.0–0.5)
Eosinophils Relative: 1 %
HCT: 46.2 % (ref 39.0–52.0)
Hemoglobin: 15.3 g/dL (ref 13.0–17.0)
Immature Granulocytes: 1 %
Lymphocytes Relative: 33 %
Lymphs Abs: 3.5 10*3/uL (ref 0.7–4.0)
MCH: 27.6 pg (ref 26.0–34.0)
MCHC: 33.1 g/dL (ref 30.0–36.0)
MCV: 83.4 fL (ref 80.0–100.0)
Monocytes Absolute: 0.8 10*3/uL (ref 0.1–1.0)
Monocytes Relative: 7 %
Neutro Abs: 6.1 10*3/uL (ref 1.7–7.7)
Neutrophils Relative %: 57 %
Platelets: 29 10*3/uL — CL (ref 150–400)
RBC: 5.54 MIL/uL (ref 4.22–5.81)
RDW: 12.7 % (ref 11.5–15.5)
WBC: 10.6 10*3/uL — ABNORMAL HIGH (ref 4.0–10.5)
nRBC: 0 % (ref 0.0–0.2)

## 2020-10-19 LAB — COLOGUARD: COLOGUARD: NEGATIVE

## 2020-10-19 MED ORDER — SODIUM CHLORIDE 0.9 % IV SOLN
INTRAVENOUS | Status: DC
  Administered 2020-10-19: 10 mL via INTRAVENOUS

## 2020-10-19 MED ORDER — MIDAZOLAM HCL 2 MG/2ML IJ SOLN
INTRAMUSCULAR | Status: AC | PRN
  Administered 2020-10-19: 1 mg via INTRAVENOUS

## 2020-10-19 MED ORDER — HYDROCODONE-ACETAMINOPHEN 5-325 MG PO TABS: 1.0000 | ORAL_TABLET | ORAL | Status: DC | PRN

## 2020-10-19 MED ORDER — FENTANYL CITRATE (PF) 100 MCG/2ML IJ SOLN
INTRAMUSCULAR | Status: AC | PRN
Start: 2020-10-19 — End: 2020-10-19
  Administered 2020-10-19: 50 ug via INTRAVENOUS

## 2020-10-19 MED ORDER — HEPARIN SOD (PORK) LOCK FLUSH 100 UNIT/ML IV SOLN
INTRAVENOUS | Status: AC
  Filled 2020-10-19: qty 5

## 2020-10-19 MED ORDER — MIDAZOLAM HCL 2 MG/2ML IJ SOLN
INTRAMUSCULAR | Status: AC
  Filled 2020-10-19: qty 2

## 2020-10-19 MED ORDER — FENTANYL CITRATE (PF) 100 MCG/2ML IJ SOLN
INTRAMUSCULAR | Status: AC
  Filled 2020-10-19: qty 2

## 2020-10-19 NOTE — Procedures (Signed)
Interventional Radiology Procedure Note  Procedure: CT guided aspirate and core biopsy of right iliac bone  Complications: None  Recommendations: - Bedrest supine x 1 hrs - Hydrocodone PRN  Pain - Follow biopsy results   Ruthann Cancer, MD

## 2020-10-19 NOTE — Progress Notes (Signed)
Patient arrived to pre-op, awake/alert. History of autism, mother at bedside for comfort/support. Provider obtained consent, explanation of procedure to patient and patient's mother "Jenny Reichmann" both verbalize understanding. Lungs CTA, heart sounds regular. No c/o's

## 2020-10-19 NOTE — H&P (Signed)
Chief Complaint: Patient was seen in consultation today for bone marrow biopsy  Referring Physician(s): Yu,Zhou  Patient Status: [facility] - Out-pt  History of Present Illness: Steven Knight is a 49 y.o. male with history of thrombocytopenia of uncertain etiology.  He presents today for CT guided bone marrow biopsy.  Denies fevers, chills, shortness of breath, chest pain, nausea, vomiting.  Past Medical History:  Diagnosis Date  . Anxiety   . Autism   . Autism   . Bipolar 1 disorder ([place])   . Depression     Past Surgical History:  Procedure Laterality Date  . EYE SURGERY      Allergies: Patient has no known allergies.  Medications: Prior to Admission medications   Medication Sig Start Date End Date Taking? Authorizing Provider  ARIPiprazole (ABILIFY) 20 MG tablet Take 1 tablet (20 mg total) by mouth daily. 07/09/18   Pucilowska, Jolanta B, MD  dexamethasone (DECADRON) 4 MG tablet Take 10 tablets (40 mg total) by mouth daily. 09/28/20   Earlie Server, MD  LORazepam (ATIVAN) 0.5 MG tablet Take 1 tablet (0.5 mg total) by mouth every 8 (eight) hours as needed for anxiety. 09/24/20   Mar Daring, PA-C  mirtazapine (REMERON) 30 MG tablet Take 1 tablet (30 mg total) by mouth at bedtime. 09/24/20   Mar Daring, PA-C  propranolol (INDERAL) 10 MG tablet Take 1 tablet (10 mg total) by mouth 2 (two) times daily. 09/24/20   Mar Daring, PA-C     Family History  Problem Relation Age of Onset  . Diabetes Mother   . Dementia Paternal Grandfather     Social History   Socioeconomic History  . Marital status: Married    Spouse name: Not on file  . Number of children: Not on file  . Years of education: Not on file  . Highest education level: Not on file  Occupational History  . Not on file  Tobacco Use  . Smoking status: Never Smoker  . Smokeless tobacco: Never Used  Vaping Use  . Vaping Use: Never used  Substance and Sexual Activity  . Alcohol  use: Never  . Drug use: Never  . Sexual activity: Yes  Other Topics Concern  . Not on file  Social History Narrative  . Not on file   Social Determinants of Health   Financial Resource Strain:   . Difficulty of Paying Living Expenses: Not on file  Food Insecurity:   . Worried About Charity fundraiser in the Last Year: Not on file  . Ran Out of Food in the Last Year: Not on file  Transportation Needs:   . Lack of Transportation (Medical): Not on file  . Lack of Transportation (Non-Medical): Not on file  Physical Activity:   . Days of Exercise per Week: Not on file  . Minutes of Exercise per Session: Not on file  Stress:   . Feeling of Stress : Not on file  Social Connections:   . Frequency of Communication with Friends and Family: Not on file  . Frequency of Social Gatherings with Friends and Family: Not on file  . Attends Religious Services: Not on file  . Active Member of Clubs or Organizations: Not on file  . Attends Archivist Meetings: Not on file  . Marital Status: Not on file    Review of Systems: A 12 point ROS discussed and pertinent positives are indicated in the HPI above.  All other systems are negative.  Vital Signs: There were no vitals taken for this visit.  Physical Exam Constitutional:      General: He is not in acute distress. HENT:     Head: Normocephalic.     Mouth/Throat:     Mouth: Mucous membranes are moist.     Comments: MP2  Cardiovascular:     Rate and Rhythm: Normal rate and regular rhythm.     Heart sounds: Normal heart sounds.  Pulmonary:     Effort: No respiratory distress.     Breath sounds: Normal breath sounds.  Abdominal:     General: There is no distension.  Musculoskeletal:        General: No swelling.     Cervical back: Neck supple.  Skin:    General: Skin is warm and dry.  Neurological:     Mental Status: He is alert and oriented to person, place, and time.     Imaging: MR Abdomen W Wo Contrast  Result  Date: 10/10/2020 CLINICAL DATA:  Renal mass seen on ultrasound. EXAM: MRI ABDOMEN WITHOUT AND WITH CONTRAST TECHNIQUE: Multiplanar multisequence MR imaging of the abdomen was performed both before and after the administration of intravenous contrast. CONTRAST:  86m GADAVIST GADOBUTROL 1 MMOL/ML IV SOLN COMPARISON:  Ultrasound exam 09/28/2020 FINDINGS: Markedly motion degraded study. Lower chest: Unremarkable. Hepatobiliary: 13 mm T2 hyperintense lesion in the anterior liver, along the gallbladder fossa is largely obscured on precontrast T1 imaging and is not visible on postcontrast T1 imaging. There is no evidence for gallstones, gallbladder wall thickening, or pericholecystic fluid. No intrahepatic or extrahepatic biliary dilation. Pancreas: Motion degraded assessment shows no focal mass lesion. No dilatation of the main duct. Spleen:  No splenomegaly. No focal mass lesion. Adrenals/Urinary Tract: No adrenal nodule or mass. Tiny T2 hyperintensities in both kidneys are likely cysts but are not definitively characterized given motion artifact on postcontrast imaging. Exophytic 2.3 cm heterogeneous lesion upper pole left kidney shows a circumferential hypointense rim with heterogeneous increased signal on T1 and T2 weighted imaging. Assessment on postcontrast imaging is degraded due to motion artifact. Stomach/Bowel: Stomach is unremarkable. No gastric wall thickening. No evidence of outlet obstruction. No small bowel or colonic dilatation within the visualized abdomen. Vascular/Lymphatic:  No abdominal aortic aneurysm. Other:  No intraperitoneal free fluid. Musculoskeletal: No focal suspicious marrow enhancement within the visualized bony anatomy. IMPRESSION: 1. Exam image quality markedly degraded by patient difficulty with reproducible breath holding. 2. Dominant finding in the upper pole left kidney likely hemorrhagic cyst although not definitively characterized on today's study due to motion artifact. Follow-up  CT abdomen with and without contrast recommended to further evaluate given the better temporal resolution of CT imaging. 3. Numerous tiny T2 hyperintensities in each kidney cannot be definitively characterized but are likely benign. These could also be reassessed at the time of follow-up CT. 4. 13 mm lesion in the anterior right liver along the gallbladder fossa, potentially hemangioma but incompletely characterized. Attention on follow-up CT recommended. Electronically Signed   By: EMisty StanleyM.D.   On: 10/10/2020 12:58   UKoreaAbdomen Complete  Result Date: 09/28/2020 CLINICAL DATA:  Thrombocytopenia EXAM: ABDOMEN ULTRASOUND COMPLETE COMPARISON:  08/11/2018 FINDINGS: Gallbladder: No gallstones or wall thickening visualized. No sonographic Murphy sign noted by sonographer. Common bile duct: Diameter: 4.5 mm Liver: No focal lesion identified. Within normal limits in parenchymal echogenicity. Portal vein is patent on color Doppler imaging with normal direction of blood flow towards the liver. IVC: No abnormality visualized. Pancreas: Visualized portion  unremarkable. Spleen: Slightly enlarged with volume of 377 mL. Right Kidney: Length: 11.1 cm.  No mass or hydronephrosis. Left Kidney: Length: 11.7 cm. No hydronephrosis. Solid appearing nodule within the upper pole measuring 2.1 x 1.9 x 2.2 cm. Abdominal aorta: No aneurysm visualized. Other findings: None. IMPRESSION: 1. The spleen appears slightly enlarged. 2. 2.2 cm solid-appearing mass within the left kidney. Further evaluation with MRI is recommended. These results will be called to the ordering clinician or representative by the Radiologist Assistant, and communication documented in the PACS or [employer]. Electronically Signed   By: Donavan Foil M.D.   On: 09/28/2020 23:11    Labs:  CBC: Recent Labs    09/27/20 0850 10/03/20 1045 10/10/20 1058 10/17/20 1141  WBC 8.8 16.4* 9.0 8.9  HGB 14.9 14.5 15.2 14.1  HCT 44.0 42.9 45.8 42.1  PLT  20* 160 47* 36*    COAGS: No results for input(s): INR, APTT in the last 8760 hours.  BMP: Recent Labs    09/24/20 1420 09/27/20 0850  NA 144 141  K 3.9 4.4  CL 103 104  CO2 27 31  GLUCOSE 87 65*  BUN 13 23*  CALCIUM 9.4 9.2  CREATININE 0.84 0.93  GFRNONAA 104 >60  GFRAA 121  --     LIVER FUNCTION TESTS: Recent Labs    09/24/20 1420 09/27/20 0850  BILITOT 0.6 0.7  AST 16 15  ALT 20 20  ALKPHOS 78 61  PROT 7.0 7.4  ALBUMIN 4.5 4.2    TUMOR MARKERS: No results for input(s): AFPTM, CEA, CA199, CHROMGRNA in the last 8760 hours.  Assessment and Plan: 49 year old male with history of indeterminate thrombocytopenia.  Plan for CT guided bone marrow biopsy with moderate sedation.  Thank you for this interesting consult.  I greatly enjoyed meeting Steven Knight and look forward to participating in their care.  A copy of this report was sent to the requesting provider on this date.  Electronically Signed: Suzette Battiest, MD 10/19/2020, 8:24 AM   I spent a total of  15 Minutes in face to face in clinical consultation, greater than 50% of which was counseling/coordinating care for CT guided bone marrow biopsy.

## 2020-10-19 NOTE — Telephone Encounter (Signed)
Called patient and no answer left vm for patient to return call.

## 2020-10-19 NOTE — Telephone Encounter (Signed)
Patient's wife Steven Knight) returned call and was advised of the results.

## 2020-10-19 NOTE — Telephone Encounter (Signed)
-----   Message from Mar Daring, PA-C sent at 10/19/2020  3:11 PM EST ----- Negative cologuard. May repeat in 3 years.

## 2020-10-19 NOTE — Discharge Instructions (Signed)
Bone Marrow Aspiration and Bone Marrow Biopsy, Adult, Care After This sheet gives you information about how to care for yourself after your procedure. If you have problems or questions, contact your health care provider.  What can I expect after the procedure?  After the procedure, it is common to have:  Mild pain and tenderness.  Swelling.  Bruising.  Follow these instructions at home:  Take over-the-counter or prescription medicines only as told by your health care provider.  You may shower tomorrow ? Remove band aid tomorrow, replace with another bandaid if  site has any drainage from biopsy site. ? Wash your hands with soap and water before you touch your biopsy site  If soap and water are not available, use hand sanitizer. ? Change your dressing frequently for bleeding and/or drainage.  Check your puncture site every day for signs of infection. Check for: ? More redness, swelling, or pain. ? More fluid or blood. ? Warmth. ? Pus or a bad smell.  Return to your normal activities in 24hours.   Do not drive for 24 hours if you were given a medicine to help you relax (sedative).  Keep all follow-up visits as told by your health care provider. This is important. Contact a health care provider if:  You have more redness, swelling, or pain around the puncture site.  You have more fluid or blood coming from the puncture site.  Your puncture site feels warm to the touch.  You have pus or a bad smell coming from the puncture site.  You have a fever.  Your pain is not controlled with medicine. This information is not intended to replace advice given to you by your health care provider. Make sure you discuss any questions you have with your health care provider. Document Released: 06/13/2005 Document Revised: 06/13/2016 Document Reviewed: 05/07/2016 Elsevier Interactive Patient Education  2018 [employer].

## 2020-10-24 LAB — SURGICAL PATHOLOGY

## 2020-10-25 ENCOUNTER — Other Ambulatory Visit: Payer: Self-pay | Admitting: Oncology

## 2020-10-25 DIAGNOSIS — C8590 Non-Hodgkin lymphoma, unspecified, unspecified site: Secondary | ICD-10-CM

## 2020-10-25 DIAGNOSIS — D696 Thrombocytopenia, unspecified: Secondary | ICD-10-CM

## 2020-10-26 ENCOUNTER — Inpatient Hospital Stay: Payer: BC Managed Care – PPO

## 2020-10-26 ENCOUNTER — Inpatient Hospital Stay (HOSPITAL_BASED_OUTPATIENT_CLINIC_OR_DEPARTMENT_OTHER): Payer: BC Managed Care – PPO | Admitting: Oncology

## 2020-10-26 ENCOUNTER — Other Ambulatory Visit: Payer: Self-pay

## 2020-10-26 ENCOUNTER — Encounter: Payer: Self-pay | Admitting: Oncology

## 2020-10-26 VITALS — BP 134/69 | HR 72 | Temp 96.2°F | Resp 18 | Wt 170.1 lb

## 2020-10-26 DIAGNOSIS — C8590 Non-Hodgkin lymphoma, unspecified, unspecified site: Secondary | ICD-10-CM

## 2020-10-26 DIAGNOSIS — D696 Thrombocytopenia, unspecified: Secondary | ICD-10-CM

## 2020-10-26 DIAGNOSIS — D472 Monoclonal gammopathy: Secondary | ICD-10-CM | POA: Insufficient documentation

## 2020-10-26 DIAGNOSIS — D479 Neoplasm of uncertain behavior of lymphoid, hematopoietic and related tissue, unspecified: Secondary | ICD-10-CM | POA: Insufficient documentation

## 2020-10-26 DIAGNOSIS — R161 Splenomegaly, not elsewhere classified: Secondary | ICD-10-CM | POA: Diagnosis not present

## 2020-10-26 LAB — CBC WITH DIFFERENTIAL/PLATELET
Abs Immature Granulocytes: 0.05 10*3/uL (ref 0.00–0.07)
Basophils Absolute: 0.1 10*3/uL (ref 0.0–0.1)
Basophils Relative: 1 %
Eosinophils Absolute: 0.1 10*3/uL (ref 0.0–0.5)
Eosinophils Relative: 1 %
HCT: 43.6 % (ref 39.0–52.0)
Hemoglobin: 14.5 g/dL (ref 13.0–17.0)
Immature Granulocytes: 1 %
Lymphocytes Relative: 44 %
Lymphs Abs: 3.4 10*3/uL (ref 0.7–4.0)
MCH: 28.2 pg (ref 26.0–34.0)
MCHC: 33.3 g/dL (ref 30.0–36.0)
MCV: 84.7 fL (ref 80.0–100.0)
Monocytes Absolute: 0.7 10*3/uL (ref 0.1–1.0)
Monocytes Relative: 9 %
Neutro Abs: 3.3 10*3/uL (ref 1.7–7.7)
Neutrophils Relative %: 44 %
Platelets: 84 10*3/uL — ABNORMAL LOW (ref 150–400)
RBC: 5.15 MIL/uL (ref 4.22–5.81)
RDW: 12.8 % (ref 11.5–15.5)
WBC: 7.6 10*3/uL (ref 4.0–10.5)
nRBC: 0 % (ref 0.0–0.2)

## 2020-10-26 NOTE — Progress Notes (Signed)
Hematology/Oncology follow up  note [facility] [phone] [phone]   Patient Care Team: Mar Daring, PA-C as PCP - General (Family Medicine) Earlie Server, MD as Consulting Physician (Hematology and Oncology)  REFERRING PROVIDER: Mar Daring, PA-C REASON FOR VISIT Follow up for treatment of thrombocytopenia  HISTORY OF PRESENTING ILLNESS:  Steven Knight is a  49 y.o.  male with PMH listed below who was referred to me for evaluation of thrombocytopenia  Patient recently had lab work done on 07/22/2018 which revealed thrombocytopenia, platelet counts 81 with comments at 2 platelet counts may be somewhat higher than reported due to aggregation of platelets in December. WBC 10.2, hemoglobin 14.9, MCV 86, neutrophil absolute 7.3 [normal ref 1.4-7.0] Creatinine 1.13, sodium 144, potassium 4.3, calcium 9.6, albumin 4.5, bilirubin 0.3, alkaline phosphatase 46, AST ALT normal.  Per patient's wife, patient's thrombocytopenia has been chronic.  They were told that patient had thrombocytopenia for about 1 to 2 years.  No bleeding events. No aggravating or improving factors.  Associated symptoms: Patient denies fatigue, weight loss, easy bruising, hema tochezia, hemoptysis.  History hepatitis or HIV infection.  Denies History of chronic liver disease denies Alcohol consumption denies  Patient has autism and bipolar disease.  Poor historian Patientis now on Abilify . for about a year.  Off Depakote.   History is obtained from wife.  # had a course of 4 days of dexamethasone and the platelet count normalized to 160.000 However dropped back to 47,000. Appetite is fair.   INTERVAL HISTORY Steven Knight is a 49 y.o. male who has above history reviewed by me today presents for follow-up of thrombocytopenia S/p bone marrow biopsy.   .Review of Systems  Unable to perform ROS: Psychiatric disorder  Constitutional:  Negative for fever, malaise/fatigue and weight loss.  Respiratory: Negative for cough.   Cardiovascular: Negative for palpitations and leg swelling.  Gastrointestinal: Negative for nausea and vomiting.  Genitourinary: Negative for urgency.  Skin: Negative for rash.  Neurological: Negative for dizziness.  Endo/Heme/Allergies: Bruises/bleeds easily.  Psychiatric/Behavioral: The patient is not nervous/anxious.     MEDICAL HISTORY:  Past Medical History:  Diagnosis Date  . Anxiety   . Autism   . Autism   . Bipolar 1 disorder ([place])   . Depression     SURGICAL HISTORY: Past Surgical History:  Procedure Laterality Date  . EYE SURGERY     No surgery SOCIAL HISTORY: Social History   Socioeconomic History  . Marital status: Married    Spouse name: Not on file  . Number of children: Not on file  . Years of education: Not on file  . Highest education level: Not on file  Occupational History  . Not on file  Tobacco Use  . Smoking status: Never Smoker  . Smokeless tobacco: Never Used  Vaping Use  . Vaping Use: Never used  Substance and Sexual Activity  . Alcohol use: Never  . Drug use: Never  . Sexual activity: Yes  Other Topics Concern  . Not on file  Social History Narrative  . Not on file   Social Determinants of Health   Financial Resource Strain:   . Difficulty of Paying Living Expenses: Not on file  Food Insecurity:   . Worried About Charity fundraiser in the Last Year: Not on file  . Ran Out of Food in the Last Year: Not on file  Transportation Needs:   . Lack of Transportation (Medical): Not on file  .  Lack of Transportation (Non-Medical): Not on file  Physical Activity:   . Days of Exercise per Week: Not on file  . Minutes of Exercise per Session: Not on file  Stress:   . Feeling of Stress : Not on file  Social Connections:   . Frequency of Communication with Friends and Family: Not on file  . Frequency of Social Gatherings with Friends and Family: Not  on file  . Attends Religious Services: Not on file  . Active Member of Clubs or Organizations: Not on file  . Attends Archivist Meetings: Not on file  . Marital Status: Not on file  Intimate Partner Violence:   . Fear of Current or Ex-Partner: Not on file  . Emotionally Abused: Not on file  . Physically Abused: Not on file  . Sexually Abused: Not on file    FAMILY HISTORY: Family History  Problem Relation Age of Onset  . Diabetes Mother   . Dementia Paternal Grandfather     ALLERGIES:  has No Known Allergies.  MEDICATIONS:  Current Outpatient Medications  Medication Sig Dispense Refill  . ARIPiprazole (ABILIFY) 20 MG tablet Take 1 tablet (20 mg total) by mouth daily. 30 tablet 1  . dexamethasone (DECADRON) 4 MG tablet Take 10 tablets (40 mg total) by mouth daily. 40 tablet 0  . LORazepam (ATIVAN) 0.5 MG tablet Take 1 tablet (0.5 mg total) by mouth every 8 (eight) hours as needed for anxiety. 90 tablet 1  . mirtazapine (REMERON) 30 MG tablet Take 1 tablet (30 mg total) by mouth at bedtime. 90 tablet 1  . propranolol (INDERAL) 10 MG tablet Take 1 tablet (10 mg total) by mouth 2 (two) times daily.     No current facility-administered medications for this visit.     PHYSICAL EXAMINATION: ECOG PERFORMANCE STATUS: 0 - Asymptomatic Vitals:   10/26/20 1009  BP: 134/69  Pulse: 72  Resp: 18  Temp: (!) 96.2 F (35.7 C)   Filed Weights   10/26/20 1009  Weight: 170 lb 1.6 oz (77.2 kg)    Physical Exam Constitutional:      General: He is not in acute distress. HENT:     Head: Normocephalic and atraumatic.  Eyes:     General: No scleral icterus.    Pupils: Pupils are equal, round, and reactive to light.  Cardiovascular:     Rate and Rhythm: Normal rate and regular rhythm.     Heart sounds: Normal heart sounds.  Pulmonary:     Effort: Pulmonary effort is normal. No respiratory distress.     Breath sounds: No wheezing.  Abdominal:     General: Bowel sounds  are normal.     Palpations: Abdomen is soft.  Musculoskeletal:        General: No deformity. Normal range of motion.     Cervical back: Normal range of motion and neck supple.  Skin:    General: Skin is warm and dry.     Findings: No erythema or rash.  Neurological:     Mental Status: He is alert and oriented to person, place, and time.     Cranial Nerves: No cranial nerve deficit.     Coordination: Coordination normal.  Psychiatric:     Comments: Able to answer questions, anxious      LABORATORY DATA:  I have reviewed the data as listed Lab Results  Component Value Date   WBC 7.6 10/26/2020   HGB 14.5 10/26/2020   HCT 43.6 10/26/2020  MCV 84.7 10/26/2020   PLT 84 (L) 10/26/2020   Recent Labs    09/24/20 1420 09/27/20 0850  NA 144 141  K 3.9 4.4  CL 103 104  CO2 27 31  GLUCOSE 87 65*  BUN 13 23*  CREATININE 0.84 0.93  CALCIUM 9.4 9.2  GFRNONAA 104 >60  GFRAA 121  --   PROT 7.0 7.4  ALBUMIN 4.5 4.2  AST 16 15  ALT 20 20  ALKPHOS 78 61  BILITOT 0.6 0.7   Iron/TIBC/Ferritin/ %Sat No results found for: IRON, TIBC, FERRITIN, IRONPCTSAT    Normal LDH, negative hepatitis and HIV, normal B12 and folate level, normal spleen size. Negative flowcytometry. Patient has history of positive anti-TPO.  Patient was seen by rheumatologist last year.  ASSESSMENT & PLAN:  1. Lymphoma, unspecified body region, unspecified lymphoma type ([place])   2. Thrombocytopenia ([place])   3. MGUS (monoclonal gammopathy of unknown significance)   4. Splenomegaly    #Thrombocytopenia Immature platelet fraction was elevated, consistent with an adequate bone marrow response. Bone marrow biopsy Slightly hypercellular bone marrow for age with megakaryocytic hyperplasia Thrombocytopenia is likely due to peripheral destruction/sequestion or consumption.   Marrow also showed numerous variably sized and primarily interstitial lymphoid aggregates primarily  composed of small lymphoid cells with  predominance of B-cells, small plasma cells 2%, kappa light chain excess.  Possible low grade B cell lymphoproliferative process - ie, Waldenstrom macroglobulemia, marginal zone lymphoma, splenic lymphoma, etc. lack of definite B-cell lymphoid clonality or abnormal phenotype or definite plasma cell clonality hinders definitive diagnosis of malignancy. A floridly reactive process as a result of infection, autoimmune disease should also be considered.  Pending cytogenetics, B cell gene arrangement analysis.   Other findings include IgM MGUS, monoclonal lymphocytosis, mild splenomegaly.  Check PET scan for further evaluation.   Return of visit: to be determined.  Earlie Server, MD, PhD Hematology Oncology [facility] Pager- [phone] 10/26/2020

## 2020-10-26 NOTE — Progress Notes (Signed)
Pt here for follow up. No new concerns voiced.   

## 2020-10-29 ENCOUNTER — Encounter (HOSPITAL_COMMUNITY): Payer: Self-pay | Admitting: Oncology

## 2020-10-30 ENCOUNTER — Encounter: Payer: Self-pay | Admitting: Oncology

## 2020-11-05 ENCOUNTER — Ambulatory Visit: Payer: BC Managed Care – PPO

## 2020-11-05 ENCOUNTER — Inpatient Hospital Stay: Payer: BC Managed Care – PPO

## 2020-11-06 ENCOUNTER — Encounter (HOSPITAL_COMMUNITY): Payer: Self-pay

## 2020-11-07 ENCOUNTER — Telehealth: Payer: BC Managed Care – PPO | Admitting: Oncology

## 2020-11-09 LAB — SURGICAL PATHOLOGY

## 2020-11-13 ENCOUNTER — Other Ambulatory Visit: Payer: Self-pay

## 2020-11-13 ENCOUNTER — Ambulatory Visit
Admission: RE | Admit: 2020-11-13 | Discharge: 2020-11-13 | Disposition: A | Discharge status: Home / Self Care | Payer: BC Managed Care – PPO | Source: Ambulatory Visit | Attending: Oncology | Admitting: Oncology

## 2020-11-13 ENCOUNTER — Inpatient Hospital Stay: Payer: BC Managed Care – PPO | Attending: Oncology

## 2020-11-13 DIAGNOSIS — D472 Monoclonal gammopathy: Secondary | ICD-10-CM | POA: Diagnosis not present

## 2020-11-13 DIAGNOSIS — N200 Calculus of kidney: Secondary | ICD-10-CM | POA: Insufficient documentation

## 2020-11-13 DIAGNOSIS — F840 Autistic disorder: Secondary | ICD-10-CM | POA: Insufficient documentation

## 2020-11-13 DIAGNOSIS — C8590 Non-Hodgkin lymphoma, unspecified, unspecified site: Secondary | ICD-10-CM

## 2020-11-13 DIAGNOSIS — F319 Bipolar disorder, unspecified: Secondary | ICD-10-CM | POA: Diagnosis not present

## 2020-11-13 DIAGNOSIS — D47Z9 Other specified neoplasms of uncertain behavior of lymphoid, hematopoietic and related tissue: Secondary | ICD-10-CM | POA: Insufficient documentation

## 2020-11-13 DIAGNOSIS — D696 Thrombocytopenia, unspecified: Secondary | ICD-10-CM | POA: Diagnosis not present

## 2020-11-13 DIAGNOSIS — Z5112 Encounter for antineoplastic immunotherapy: Secondary | ICD-10-CM | POA: Diagnosis present

## 2020-11-13 LAB — CBC WITH DIFFERENTIAL/PLATELET
Abs Immature Granulocytes: 0.07 10*3/uL (ref 0.00–0.07)
Basophils Absolute: 0.1 10*3/uL (ref 0.0–0.1)
Basophils Relative: 1 %
Eosinophils Absolute: 0.1 10*3/uL (ref 0.0–0.5)
Eosinophils Relative: 1 %
HCT: 45.8 % (ref 39.0–52.0)
Hemoglobin: 15.7 g/dL (ref 13.0–17.0)
Immature Granulocytes: 1 %
Lymphocytes Relative: 34 %
Lymphs Abs: 3.6 10*3/uL (ref 0.7–4.0)
MCH: 28.5 pg (ref 26.0–34.0)
MCHC: 34.3 g/dL (ref 30.0–36.0)
MCV: 83.3 fL (ref 80.0–100.0)
Monocytes Absolute: 0.8 10*3/uL (ref 0.1–1.0)
Monocytes Relative: 8 %
Neutro Abs: 5.9 10*3/uL (ref 1.7–7.7)
Neutrophils Relative %: 55 %
Platelets: 34 10*3/uL — ABNORMAL LOW (ref 150–400)
RBC: 5.5 MIL/uL (ref 4.22–5.81)
RDW: 12.8 % (ref 11.5–15.5)
WBC: 10.5 10*3/uL (ref 4.0–10.5)
nRBC: 0 % (ref 0.0–0.2)

## 2020-11-13 LAB — GLUCOSE, CAPILLARY: Glucose-Capillary: 80 mg/dL (ref 70–99)

## 2020-11-13 MED ORDER — FLUDEOXYGLUCOSE F - 18 (FDG) INJECTION
8.7630 | Freq: Once | INTRAVENOUS | Status: AC | PRN
  Administered 2020-11-13: 8.763 via INTRAVENOUS

## 2020-11-14 ENCOUNTER — Encounter: Payer: Self-pay | Admitting: Oncology

## 2020-11-14 ENCOUNTER — Inpatient Hospital Stay (HOSPITAL_BASED_OUTPATIENT_CLINIC_OR_DEPARTMENT_OTHER): Payer: BC Managed Care – PPO | Admitting: Oncology

## 2020-11-14 DIAGNOSIS — D479 Neoplasm of uncertain behavior of lymphoid, hematopoietic and related tissue, unspecified: Secondary | ICD-10-CM | POA: Diagnosis not present

## 2020-11-14 DIAGNOSIS — D696 Thrombocytopenia, unspecified: Secondary | ICD-10-CM | POA: Diagnosis not present

## 2020-11-14 DIAGNOSIS — Z7189 Other specified counseling: Secondary | ICD-10-CM

## 2020-11-14 DIAGNOSIS — D472 Monoclonal gammopathy: Secondary | ICD-10-CM | POA: Diagnosis not present

## 2020-11-14 DIAGNOSIS — C8510 Unspecified B-cell lymphoma, unspecified site: Secondary | ICD-10-CM

## 2020-11-14 NOTE — Progress Notes (Signed)
Patient denies new problems/concerns today.   °

## 2020-11-14 NOTE — Progress Notes (Signed)
HEMATOLOGY-ONCOLOGY TeleHEALTH VISIT PROGRESS NOTE  I connected with Steven Knight on _0  @ at  2:30 PM EST by video enabled telemedicine visit and verified that I am speaking with the correct person using two identifiers. I discussed the limitations, risks, security and privacy concerns of performing an evaluation and management service by telemedicine and the availability of in-person appointments. The patient expressed understanding and agreed to proceed.   Other persons participating in the visit and their role in the encounter:  Wife   Patient's location: Home  Provider's location: office Chief Complaint: Follow-up for thrombocytopenia   INTERVAL HISTORY LYNDELL ALLAIRE is a 49 y.o. male who has above history reviewed by me today presents for follow up visit for management of thrombocytopenia Problems and complaints are listed below:  Patient has had  PET scan done.  No new complaints.  Denies any bleeding events.  Review of Systems  Unable to perform ROS: Other (Psychiatry disorder)  Constitutional: Negative for fatigue.  Hematological:       No acute bleeding events    Past Medical History:  Diagnosis Date  . Anxiety   . Autism   . Autism   . Bipolar 1 disorder ([place])   . Depression    Past Surgical History:  Procedure Laterality Date  . EYE SURGERY      Family History  Problem Relation Age of Onset  . Diabetes Mother   . Dementia Paternal Grandfather     Social History   Socioeconomic History  . Marital status: Married    Spouse name: Not on file  . Number of children: Not on file  . Years of education: Not on file  . Highest education level: Not on file  Occupational History  . Not on file  Tobacco Use  . Smoking status: Never Smoker  . Smokeless tobacco: Never Used  Vaping Use  . Vaping Use: Never used  Substance and Sexual Activity  . Alcohol use: Never  . Drug use: Never  . Sexual activity: Yes  Other Topics Concern  . Not on  file  Social History Narrative  . Not on file   Social Determinants of Health   Financial Resource Strain:   . Difficulty of Paying Living Expenses: Not on file  Food Insecurity:   . Worried About Charity fundraiser in the Last Year: Not on file  . Ran Out of Food in the Last Year: Not on file  Transportation Needs:   . Lack of Transportation (Medical): Not on file  . Lack of Transportation (Non-Medical): Not on file  Physical Activity:   . Days of Exercise per Week: Not on file  . Minutes of Exercise per Session: Not on file  Stress:   . Feeling of Stress : Not on file  Social Connections:   . Frequency of Communication with Friends and Family: Not on file  . Frequency of Social Gatherings with Friends and Family: Not on file  . Attends Religious Services: Not on file  . Active Member of Clubs or Organizations: Not on file  . Attends Archivist Meetings: Not on file  . Marital Status: Not on file  Intimate Partner Violence:   . Fear of Current or Ex-Partner: Not on file  . Emotionally Abused: Not on file  . Physically Abused: Not on file  . Sexually Abused: Not on file    Current Outpatient Medications on File Prior to Visit  Medication Sig Dispense Refill  . ARIPiprazole (ABILIFY)  20 MG tablet Take 1 tablet (20 mg total) by mouth daily. 30 tablet 1  . LORazepam (ATIVAN) 0.5 MG tablet Take 1 tablet (0.5 mg total) by mouth every 8 (eight) hours as needed for anxiety. 90 tablet 1  . mirtazapine (REMERON) 30 MG tablet Take 1 tablet (30 mg total) by mouth at bedtime. 90 tablet 1  . propranolol (INDERAL) 10 MG tablet Take 1 tablet (10 mg total) by mouth 2 (two) times daily.    Marland Kitchen dexamethasone (DECADRON) 4 MG tablet Take 10 tablets (40 mg total) by mouth daily. (Patient not taking: Reported on 11/14/2020) 40 tablet 0   No current facility-administered medications on file prior to visit.    No Known Allergies     Observations/Objective: Today's Vitals   11/14/20  1444  PainSc: 0-No pain   There is no height or weight on file to calculate BMI.  Physical Exam Neurological:     Mental Status: He is alert.     CBC    Component Value Date/Time   WBC 10.5 11/13/2020 0925   RBC 5.50 11/13/2020 0925   HGB 15.7 11/13/2020 0925   HGB 14.1 09/24/2020 1420   HCT 45.8 11/13/2020 0925   HCT 41.3 09/24/2020 1420   PLT 34 (L) 11/13/2020 0925   PLT 14 (LL) 09/24/2020 1420   MCV 83.3 11/13/2020 0925   MCV 82 09/24/2020 1420   MCH 28.5 11/13/2020 0925   MCHC 34.3 11/13/2020 0925   RDW 12.8 11/13/2020 0925   RDW 12.7 09/24/2020 1420   LYMPHSABS 3.6 11/13/2020 0925   LYMPHSABS 2.9 09/24/2020 1420   MONOABS 0.8 11/13/2020 0925   EOSABS 0.1 11/13/2020 0925   EOSABS 0.1 09/24/2020 1420   BASOSABS 0.1 11/13/2020 0925   BASOSABS 0.1 09/24/2020 1420    CMP     Component Value Date/Time   NA 141 09/27/2020 0850   NA 144 09/24/2020 1420   K 4.4 09/27/2020 0850   CL 104 09/27/2020 0850   CO2 31 09/27/2020 0850   GLUCOSE 65 (L) 09/27/2020 0850   BUN 23 (H) 09/27/2020 0850   BUN 13 09/24/2020 1420   CREATININE 0.93 09/27/2020 0850   CALCIUM 9.2 09/27/2020 0850   PROT 7.4 09/27/2020 0850   PROT 7.0 09/24/2020 1420   ALBUMIN 4.2 09/27/2020 0850   ALBUMIN 4.5 09/24/2020 1420   AST 15 09/27/2020 0850   ALT 20 09/27/2020 0850   ALKPHOS 61 09/27/2020 0850   BILITOT 0.7 09/27/2020 0850   BILITOT 0.6 09/24/2020 1420   GFRNONAA >60 09/27/2020 0850   GFRAA 121 09/24/2020 1420     Assessment and Plan: 1. B-cell lymphoproliferative disorder ([place])   2. Thrombocytopenia ([place])   3. MGUS (monoclonal gammopathy of unknown significance)     Additional molecular study for bone marrow biopsy tissue has come back. Patient is positive for B-cell rearrangement-which indicates patient has underlying B-cell lymphoproliferative disorder. MYD 88 is negative-less likely [place] macro globin anemia  Discussed with wife and patient about the diagnosis of   low-grade B cell lymphoproliferative disorder.  PET scan was reviewed and discussed with patient.  No PET evidence of lymphoma. Discussed with Dr.Smir, he will check t (11,18).   Thrombocytopenia, today's blood work showed platelet count of 34,000.  No bleeding events. Repeat CBC in 2 weeks.    Plan Rituximab weekly x 4 for treatment of recurrent ITP as well as low grade B cell lymphoproliferative disorder. Rational and potential side effects including but not limited to infusion  reaction, reactivation of virus infection, decreased immunity, low blood counts, mucocutaenous reactions, leukoencephalopathy, etc.    Patient and wife agree with the plan. Chemo Education class.    Follow Up Instructions: 2 weeks.   I discussed the assessment and treatment plan with the patient. The patient was provided an opportunity to ask questions and all were answered. The patient agreed with the plan and demonstrated an understanding of the instructions.  The patient was advised to call back or seek an in-person evaluation if the symptoms worsen or if the condition fails to improve as anticipated.    Earlie Server, MD 11/14/2020 9:34 PM

## 2020-11-15 NOTE — Patient Instructions (Signed)
Rituximab injection What is this medicine? RITUXIMAB (ri TUX i mab) is a monoclonal antibody. It is used to treat certain types of cancer like non-Hodgkin lymphoma and chronic lymphocytic leukemia. It is also used to treat rheumatoid arthritis, granulomatosis with polyangiitis (or Wegener's granulomatosis), microscopic polyangiitis, and pemphigus vulgaris. This medicine may be used for other purposes; ask your health care provider or pharmacist if you have questions. COMMON BRAND NAME(S): Rituxan, RUXIENCE What should I tell my health care provider before I take this medicine? They need to know if you have any of these conditions:  heart disease  infection (especially a virus infection such as hepatitis B, chickenpox, cold sores, or herpes)  immune system problems  irregular heartbeat  kidney disease  low blood counts, like low white cell, platelet, or red cell counts  lung or breathing disease, like asthma  recently received or scheduled to receive a vaccine  an unusual or allergic reaction to rituximab, other medicines, foods, dyes, or preservatives  pregnant or trying to get pregnant  breast-feeding How should I use this medicine? This medicine is for infusion into a vein. It is administered in a hospital or clinic by a specially trained health care professional. A special MedGuide will be given to you by the pharmacist with each prescription and refill. Be sure to read this information carefully each time. Talk to your pediatrician regarding the use of this medicine in children. This medicine is not approved for use in children. Overdosage: If you think you have taken too much of this medicine contact a poison control center or emergency room at once. NOTE: This medicine is only for you. Do not share this medicine with others. What if I miss a dose? It is important not to miss a dose. Call your doctor or health care professional if you are unable to keep an appointment. What  may interact with this medicine?  cisplatin  live virus vaccines This list may not describe all possible interactions. Give your health care provider a list of all the medicines, herbs, non-prescription drugs, or dietary supplements you use. Also tell them if you smoke, drink alcohol, or use illegal drugs. Some items may interact with your medicine. What should I watch for while using this medicine? Your condition will be monitored carefully while you are receiving this medicine. You may need blood work done while you are taking this medicine. This medicine can cause serious allergic reactions. To reduce your risk you may need to take medicine before treatment with this medicine. Take your medicine as directed. In some patients, this medicine may cause a serious brain infection that may cause death. If you have any problems seeing, thinking, speaking, walking, or standing, tell your healthcare professional right away. If you cannot reach your healthcare professional, urgently seek other source of medical care. Call your doctor or health care professional for advice if you get a fever, chills or sore throat, or other symptoms of a cold or flu. Do not treat yourself. This drug decreases your body's ability to fight infections. Try to avoid being around people who are sick. Do not become pregnant while taking this medicine or for at least 12 months after stopping it. Women should inform their doctor if they wish to become pregnant or think they might be pregnant. There is a potential for serious side effects to an unborn child. Talk to your health care professional or pharmacist for more information. Do not breast-feed an infant while taking this medicine or for at  least 6 months after stopping it. What side effects may I notice from receiving this medicine? Side effects that you should report to your doctor or health care professional as soon as possible:  allergic reactions like skin rash, itching or  hives; swelling of the face, lips, or tongue  breathing problems  chest pain  changes in vision  diarrhea  headache with fever, neck stiffness, sensitivity to light, nausea, or confusion  fast, irregular heartbeat  loss of memory  low blood counts - this medicine may decrease the number of white blood cells, red blood cells and platelets. You may be at increased risk for infections and bleeding.  mouth sores  problems with balance, talking, or walking  redness, blistering, peeling or loosening of the skin, including inside the mouth  signs of infection - fever or chills, cough, sore throat, pain or difficulty passing urine  signs and symptoms of kidney injury like trouble passing urine or change in the amount of urine  signs and symptoms of liver injury like dark yellow or brown urine; general ill feeling or flu-like symptoms; light-colored stools; loss of appetite; nausea; right upper belly pain; unusually weak or tired; yellowing of the eyes or skin  signs and symptoms of low blood pressure like dizziness; feeling faint or lightheaded, falls; unusually weak or tired  stomach pain  swelling of the ankles, feet, hands  unusual bleeding or bruising  vomiting Side effects that usually do not require medical attention (report to your doctor or health care professional if they continue or are bothersome):  headache  joint pain  muscle cramps or muscle pain  nausea  tiredness This list may not describe all possible side effects. Call your doctor for medical advice about side effects. You may report side effects to FDA at 1-800-FDA-1088. Where should I keep my medicine? This drug is given in a hospital or clinic and will not be stored at home. NOTE: This sheet is a summary. It may not cover all possible information. If you have questions about this medicine, talk to your doctor, pharmacist, or health care provider.  2020 Elsevier/Gold Standard (2019-01-05  22:01:36)

## 2020-11-16 ENCOUNTER — Inpatient Hospital Stay: Payer: BC Managed Care – PPO

## 2020-11-16 ENCOUNTER — Inpatient Hospital Stay (HOSPITAL_BASED_OUTPATIENT_CLINIC_OR_DEPARTMENT_OTHER): Payer: BC Managed Care – PPO | Admitting: Oncology

## 2020-11-16 DIAGNOSIS — D472 Monoclonal gammopathy: Secondary | ICD-10-CM

## 2020-11-20 ENCOUNTER — Telehealth: Payer: Self-pay

## 2020-11-20 DIAGNOSIS — Z7189 Other specified counseling: Secondary | ICD-10-CM | POA: Insufficient documentation

## 2020-11-20 NOTE — Telephone Encounter (Signed)
-----   Message from Earlie Server, MD sent at 11/20/2020  8:27 AM EST ----- Please schedule him for Rituximab chemo edu.  Schedule him to do lab MD Rituximab 12/23. He currently has lab on 12/22, can change to 12/23. Thanks

## 2020-11-20 NOTE — Telephone Encounter (Deleted)
-----   Message from Earlie Server, MD sent at 11/20/2020  8:27 AM EST ----- Please schedule him for Rituximab chemo edu.  Schedule him to do lab MD Rituximab 12/23. He currently has lab on 12/22, can change to 12/23. Thanks

## 2020-11-20 NOTE — Telephone Encounter (Signed)
Patient has already had chemo education. Steven Knight, please schedule and make scheduling changes as requested by MD. Please call pt's wife with appts. Thanks.

## 2020-11-20 NOTE — Telephone Encounter (Deleted)
Patient had chemo class on 11/16/20.  Schedule lab/MD/Rituximab on 12/23 as MD requests.   Pease inform patient's wife of appt details.  MD discussed tx plan at last virtual visit.

## 2020-11-20 NOTE — Progress Notes (Signed)
START OFF PATHWAY REGIMEN - Lymphoma and CLL   [id number]:Rituximab (Weekly):   Administer weekly:     Rituximab-xxxx   **Always confirm dose/schedule in your pharmacy ordering system**  Patient Characteristics: Disease Type: Not Applicable Disease Type: Not Applicable Disease Type: Not Applicable Intent of Therapy: Non-Curative / Palliative Intent, Discussed with Patient

## 2020-11-21 ENCOUNTER — Other Ambulatory Visit: Payer: Self-pay

## 2020-11-21 DIAGNOSIS — D479 Neoplasm of uncertain behavior of lymphoid, hematopoietic and related tissue, unspecified: Secondary | ICD-10-CM

## 2020-11-21 NOTE — Telephone Encounter (Signed)
Per MD, please move up lab/MD up to this week and schedule him for rtuximab *new* on 12/21 or 12/22. Please call pt/wife with appts.

## 2020-11-21 NOTE — Telephone Encounter (Signed)
Documented in another telephone note encounter.

## 2020-11-21 NOTE — Telephone Encounter (Signed)
Done..  Pt has been sched to [facility] on 12/16 for lab/MD and 12/21 for **NEW** Rituximab per MD. pt wife was made aware of the sched appts

## 2020-11-22 ENCOUNTER — Inpatient Hospital Stay: Payer: BC Managed Care – PPO

## 2020-11-22 ENCOUNTER — Encounter: Payer: Self-pay | Admitting: Oncology

## 2020-11-22 ENCOUNTER — Inpatient Hospital Stay (HOSPITAL_BASED_OUTPATIENT_CLINIC_OR_DEPARTMENT_OTHER): Payer: BC Managed Care – PPO | Admitting: Oncology

## 2020-11-22 VITALS — BP 102/78 | HR 53 | Temp 98.2°F | Resp 16 | Wt 170.3 lb

## 2020-11-22 DIAGNOSIS — D696 Thrombocytopenia, unspecified: Secondary | ICD-10-CM

## 2020-11-22 DIAGNOSIS — D479 Neoplasm of uncertain behavior of lymphoid, hematopoietic and related tissue, unspecified: Secondary | ICD-10-CM

## 2020-11-22 DIAGNOSIS — Z5112 Encounter for antineoplastic immunotherapy: Secondary | ICD-10-CM | POA: Diagnosis not present

## 2020-11-22 DIAGNOSIS — D472 Monoclonal gammopathy: Secondary | ICD-10-CM | POA: Diagnosis not present

## 2020-11-22 LAB — COMPREHENSIVE METABOLIC PANEL
ALT: 19 U/L (ref 0–44)
AST: 15 U/L (ref 15–41)
Albumin: 4.4 g/dL (ref 3.5–5.0)
Alkaline Phosphatase: 47 U/L (ref 38–126)
Anion gap: 9 (ref 5–15)
BUN: 22 mg/dL — ABNORMAL HIGH (ref 6–20)
CO2: 27 mmol/L (ref 22–32)
Calcium: 9.3 mg/dL (ref 8.9–10.3)
Chloride: 99 mmol/L (ref 98–111)
Creatinine, Ser: 0.97 mg/dL (ref 0.61–1.24)
GFR, Estimated: >60 mL/min (ref >60)
Glucose, Bld: 132 mg/dL — ABNORMAL HIGH (ref 70–99)
Potassium: 4 mmol/L (ref 3.5–5.1)
Sodium: 135 mmol/L (ref 135–145)
Total Bilirubin: 1.1 mg/dL (ref 0.3–1.2)
Total Protein: 7.6 g/dL (ref 6.5–8.1)

## 2020-11-22 LAB — CBC WITH DIFFERENTIAL/PLATELET
Abs Immature Granulocytes: 0.11 10*3/uL — ABNORMAL HIGH (ref 0.00–0.07)
Basophils Absolute: 0.1 10*3/uL (ref 0.0–0.1)
Basophils Relative: 1 %
Eosinophils Absolute: 0.1 10*3/uL (ref 0.0–0.5)
Eosinophils Relative: 1 %
HCT: 44.7 % (ref 39.0–52.0)
Hemoglobin: 15 g/dL (ref 13.0–17.0)
Immature Granulocytes: 1 %
Lymphocytes Relative: 29 %
Lymphs Abs: 2.6 10*3/uL (ref 0.7–4.0)
MCH: 27.9 pg (ref 26.0–34.0)
MCHC: 33.6 g/dL (ref 30.0–36.0)
MCV: 83.2 fL (ref 80.0–100.0)
Monocytes Absolute: 0.7 10*3/uL (ref 0.1–1.0)
Monocytes Relative: 8 %
Neutro Abs: 5.3 10*3/uL (ref 1.7–7.7)
Neutrophils Relative %: 60 %
Platelets: 46 10*3/uL — ABNORMAL LOW (ref 150–400)
RBC: 5.37 MIL/uL (ref 4.22–5.81)
RDW: 12.8 % (ref 11.5–15.5)
WBC: 8.9 10*3/uL (ref 4.0–10.5)
nRBC: 0 % (ref 0.0–0.2)

## 2020-11-22 NOTE — Progress Notes (Signed)
Patient denies new problems/concerns today.   °

## 2020-11-22 NOTE — Progress Notes (Signed)
Hematology/Oncology follow up  note [facility] [phone] [phone]   Patient Care Team: Mar Daring, PA-C as PCP - General (Family Medicine) Earlie Server, MD as Consulting Physician (Hematology and Oncology)  REFERRING PROVIDER: Mar Daring, PA-C REASON FOR VISIT Follow up for treatment of thrombocytopenia  HISTORY OF PRESENTING ILLNESS:  Steven Knight is a  49 y.o.  male with PMH listed below who was referred to me for evaluation of thrombocytopenia  Patient recently had lab work done on 07/22/2018 which revealed thrombocytopenia, platelet counts 81 with comments at 2 platelet counts may be somewhat higher than reported due to aggregation of platelets in December. WBC 10.2, hemoglobin 14.9, MCV 86, neutrophil absolute 7.3 [normal ref 1.4-7.0] Creatinine 1.13, sodium 144, potassium 4.3, calcium 9.6, albumin 4.5, bilirubin 0.3, alkaline phosphatase 46, AST ALT normal.  Per patient's wife, patient's thrombocytopenia has been chronic.  They were told that patient had thrombocytopenia for about 1 to 2 years.  No bleeding events. No aggravating or improving factors.  Associated symptoms: Patient denies fatigue, weight loss, easy bruising, hema tochezia, hemoptysis.  History hepatitis or HIV infection.  Denies History of chronic liver disease denies Alcohol consumption denies  Patient has autism and bipolar disease.  Poor historian Patientis now on Abilify . for about a year.  Off Depakote.   History is obtained from wife.  # had a course of 4 days of dexamethasone and the platelet count normalized to 160.000 However dropped back to 47,000. Appetite is fair.   INTERVAL HISTORY Steven Knight is a 49 y.o. male who has above history reviewed by me today presents for follow-up of thrombocytopenia Patient was accompanied by wife.  He has no new complaints.  No acute bleeding events.  Easy bruising  .Review of  Systems  Unable to perform ROS: Psychiatric disorder  Constitutional: Negative for fever, malaise/fatigue and weight loss.  Respiratory: Negative for cough.   Cardiovascular: Negative for palpitations and leg swelling.  Gastrointestinal: Negative for nausea and vomiting.  Genitourinary: Negative for urgency.  Skin: Negative for rash.  Neurological: Negative for dizziness.  Endo/Heme/Allergies: Bruises/bleeds easily.  Psychiatric/Behavioral: The patient is not nervous/anxious.     MEDICAL HISTORY:  Past Medical History:  Diagnosis Date  . Anxiety   . Autism   . Autism   . Bipolar 1 disorder ([place])   . Depression     SURGICAL HISTORY: Past Surgical History:  Procedure Laterality Date  . EYE SURGERY     No surgery SOCIAL HISTORY: Social History   Socioeconomic History  . Marital status: Married    Spouse name: Not on file  . Number of children: Not on file  . Years of education: Not on file  . Highest education level: Not on file  Occupational History  . Not on file  Tobacco Use  . Smoking status: Never Smoker  . Smokeless tobacco: Never Used  Vaping Use  . Vaping Use: Never used  Substance and Sexual Activity  . Alcohol use: Never  . Drug use: Never  . Sexual activity: Yes  Other Topics Concern  . Not on file  Social History Narrative  . Not on file   Social Determinants of Health   Financial Resource Strain: Not on file  Food Insecurity: Not on file  Transportation Needs: Not on file  Physical Activity: Not on file  Stress: Not on file  Social Connections: Not on file  Intimate Partner Violence: Not on file  FAMILY HISTORY: Family History  Problem Relation Age of Onset  . Diabetes Mother   . Dementia Paternal Grandfather     ALLERGIES:  has No Known Allergies.  MEDICATIONS:  Current Outpatient Medications  Medication Sig Dispense Refill  . ARIPiprazole (ABILIFY) 20 MG tablet Take 1 tablet (20 mg total) by mouth daily. 30 tablet 1  .  dexamethasone (DECADRON) 4 MG tablet Take 10 tablets (40 mg total) by mouth daily. (Patient not taking: Reported on 11/14/2020) 40 tablet 0  . LORazepam (ATIVAN) 0.5 MG tablet Take 1 tablet (0.5 mg total) by mouth every 8 (eight) hours as needed for anxiety. 90 tablet 1  . mirtazapine (REMERON) 30 MG tablet Take 1 tablet (30 mg total) by mouth at bedtime. 90 tablet 1  . propranolol (INDERAL) 10 MG tablet Take 1 tablet (10 mg total) by mouth 2 (two) times daily.     No current facility-administered medications for this visit.     PHYSICAL EXAMINATION: ECOG PERFORMANCE STATUS: 0 - Asymptomatic Vitals:   11/22/20 0839  BP: 102/78  Pulse: (!) 53  Resp: 16  Temp: 98.2 F (36.8 C)  SpO2: 98%   Filed Weights   11/22/20 0839  Weight: 170 lb 4.8 oz (77.2 kg)    Physical Exam Constitutional:      General: He is not in acute distress. HENT:     Head: Normocephalic and atraumatic.  Eyes:     General: No scleral icterus.    Pupils: Pupils are equal, round, and reactive to light.  Cardiovascular:     Rate and Rhythm: Normal rate and regular rhythm.     Heart sounds: Normal heart sounds.  Pulmonary:     Effort: Pulmonary effort is normal. No respiratory distress.     Breath sounds: No wheezing.  Abdominal:     General: Bowel sounds are normal.     Palpations: Abdomen is soft.  Musculoskeletal:        General: No deformity. Normal range of motion.     Cervical back: Normal range of motion and neck supple.  Skin:    General: Skin is warm and dry.     Findings: No erythema or rash.  Neurological:     Mental Status: He is alert and oriented to person, place, and time.     Cranial Nerves: No cranial nerve deficit.     Coordination: Coordination normal.  Psychiatric:     Comments: Able to answer questions, anxious      LABORATORY DATA:  I have reviewed the data as listed Lab Results  Component Value Date   WBC 8.9 11/22/2020   HGB 15.0 11/22/2020   HCT 44.7 11/22/2020   MCV  83.2 11/22/2020   PLT 46 (L) 11/22/2020   Recent Labs    09/24/20 1420 09/27/20 0850 11/22/20 0751  NA 144 141 135  K 3.9 4.4 4.0  CL 103 104 99  CO2 27 31 27   GLUCOSE 87 65* 132*  BUN 13 23* 22*  CREATININE 0.84 0.93 0.97  CALCIUM 9.4 9.2 9.3  GFRNONAA 104 >60 >60  GFRAA 121  --   --   PROT 7.0 7.4 7.6  ALBUMIN 4.5 4.2 4.4  AST 16 15 15   ALT 20 20 19   ALKPHOS 78 61 47  BILITOT 0.6 0.7 1.1   Iron/TIBC/Ferritin/ %Sat No results found for: IRON, TIBC, FERRITIN, IRONPCTSAT    Normal LDH, negative hepatitis and HIV, normal B12 and folate level, normal spleen size. Negative flowcytometry. Patient  has history of positive anti-TPO.  Patient was seen by rheumatologist last year.  ASSESSMENT & PLAN:  1. B-cell lymphoproliferative disorder ([place])   2. Thrombocytopenia ([place])   3. MGUS (monoclonal gammopathy of unknown significance)    #Thrombocytopenia, due to peripheral destruction/sequestion or consumption.  Likely primary ITP versus secondary ITP related to underlying B-cell lymphoproliferative disorder.  See below Thrombocytopenia responded to steroids however decreased again after steroids is discontinued.  I discussed with pathologist Dr. Gari Crown.  Bone marrow showed numerous variably sized and primarily interstitial lymphoid aggregates primarily  composed of small lymphoid cells with predominance of B-cells, small plasma cells 2%, kappa light chain excess.  -MYD88 mutation, + B-cell rearrangement which indicates underlying B-cell lymphoproliferative disorder. PET scan showed no hypermetabolic activities including spleen.  So less likely spleen lymphoma. Suspect extranodal marginal zone lymphoma.  Dr.Smir plans to add t (11,18).  Cytogenetics is normal.  Discussed with patient and wife about the option of Rituximab vs continue observation. I think rituximab is a reasonable option given his relapsed ITP and low grade B cell lymphoproliferative disorder. Rational and possible  side effects including but not limited to cutaneous reactions, hepatitis B virus reactivation,Progressive multifocal leukoencephalopathy, decrease immunity, infusion reactions, possible worse outcome with Covid 19 infection, etc. were discussed in details with patient and wife.  Both agree with proceeding with treatment.  Patient will proceed with first rituximab treatment on 11/27/2020 and the second treatment on 12/04/2020.  IgM MGUS, monoclonal lymphocytosis, mild splenomegaly. Monitor.    Return of visit: I will see patient during the first week of January for reevaluation and additional rituximab. Earlie Server, MD, PhD Hematology Oncology [facility] Pager- [phone] 11/22/2020

## 2020-11-27 ENCOUNTER — Inpatient Hospital Stay: Payer: BC Managed Care – PPO

## 2020-11-27 VITALS — BP 118/76 | HR 89 | Temp 98.6°F | Resp 20 | Wt 171.4 lb

## 2020-11-27 DIAGNOSIS — Z5112 Encounter for antineoplastic immunotherapy: Secondary | ICD-10-CM | POA: Diagnosis not present

## 2020-11-27 DIAGNOSIS — C8510 Unspecified B-cell lymphoma, unspecified site: Secondary | ICD-10-CM

## 2020-11-27 MED ORDER — ACETAMINOPHEN 325 MG PO TABS
650.0000 mg | ORAL_TABLET | Freq: Once | ORAL | Status: AC
  Administered 2020-11-27: 650 mg via ORAL
  Filled 2020-11-27: qty 2

## 2020-11-27 MED ORDER — DIPHENHYDRAMINE HCL 25 MG PO CAPS
50.0000 mg | ORAL_CAPSULE | Freq: Once | ORAL | Status: AC
  Administered 2020-11-27: 50 mg via ORAL
  Filled 2020-11-27: qty 2

## 2020-11-27 MED ORDER — SODIUM CHLORIDE 0.9 % IV SOLN
Freq: Once | INTRAVENOUS | Status: AC
  Filled 2020-11-27: qty 250

## 2020-11-27 MED ORDER — SODIUM CHLORIDE 0.9 % IV SOLN
375.0000 mg/m2 | Freq: Once | INTRAVENOUS | Status: AC
  Administered 2020-11-27: 700 mg via INTRAVENOUS
  Filled 2020-11-27: qty 50

## 2020-11-27 NOTE — Progress Notes (Signed)
1405- Patient tolerated first Ruxience treatment well. Patient and vital signs stable. Patient discharged to home at this time.

## 2020-11-28 ENCOUNTER — Other Ambulatory Visit: Payer: BC Managed Care – PPO

## 2020-11-29 ENCOUNTER — Telehealth: Payer: Self-pay

## 2020-11-29 ENCOUNTER — Encounter (HOSPITAL_COMMUNITY): Payer: Self-pay | Admitting: Oncology

## 2020-11-29 NOTE — Telephone Encounter (Signed)
Telephone call to patient for follow up after receiving first infusion.   Patient states infusion went great.  States eating good and drinking plenty of fluids.   Denies any nausea or vomiting.  Encouraged patient to call for any concerns or questions. 

## 2020-12-04 ENCOUNTER — Other Ambulatory Visit: Payer: Self-pay | Admitting: Oncology

## 2020-12-04 ENCOUNTER — Inpatient Hospital Stay: Payer: BC Managed Care – PPO

## 2020-12-04 VITALS — BP 114/71 | HR 84 | Temp 97.1°F | Resp 18 | Wt 170.1 lb

## 2020-12-04 DIAGNOSIS — D472 Monoclonal gammopathy: Secondary | ICD-10-CM

## 2020-12-04 DIAGNOSIS — C8510 Unspecified B-cell lymphoma, unspecified site: Secondary | ICD-10-CM

## 2020-12-04 DIAGNOSIS — Z5112 Encounter for antineoplastic immunotherapy: Secondary | ICD-10-CM | POA: Diagnosis not present

## 2020-12-04 DIAGNOSIS — D696 Thrombocytopenia, unspecified: Secondary | ICD-10-CM

## 2020-12-04 DIAGNOSIS — D479 Neoplasm of uncertain behavior of lymphoid, hematopoietic and related tissue, unspecified: Secondary | ICD-10-CM

## 2020-12-04 LAB — CBC WITH DIFFERENTIAL/PLATELET
Abs Immature Granulocytes: 0.03 10*3/uL (ref 0.00–0.07)
Basophils Absolute: 0.1 10*3/uL (ref 0.0–0.1)
Basophils Relative: 1 %
Eosinophils Absolute: 0.1 10*3/uL (ref 0.0–0.5)
Eosinophils Relative: 1 %
HCT: 43.5 % (ref 39.0–52.0)
Hemoglobin: 14.6 g/dL (ref 13.0–17.0)
Immature Granulocytes: 0 %
Lymphocytes Relative: 27 %
Lymphs Abs: 1.9 10*3/uL (ref 0.7–4.0)
MCH: 28.1 pg (ref 26.0–34.0)
MCHC: 33.6 g/dL (ref 30.0–36.0)
MCV: 83.8 fL (ref 80.0–100.0)
Monocytes Absolute: 0.5 10*3/uL (ref 0.1–1.0)
Monocytes Relative: 7 %
Neutro Abs: 4.4 10*3/uL (ref 1.7–7.7)
Neutrophils Relative %: 64 %
Platelets: 123 10*3/uL — ABNORMAL LOW (ref 150–400)
RBC: 5.19 MIL/uL (ref 4.22–5.81)
RDW: 12.9 % (ref 11.5–15.5)
WBC: 7.1 10*3/uL (ref 4.0–10.5)
nRBC: 0 % (ref 0.0–0.2)

## 2020-12-04 MED ORDER — ACETAMINOPHEN 325 MG PO TABS
650.0000 mg | ORAL_TABLET | Freq: Once | ORAL | Status: AC
  Administered 2020-12-04: 650 mg via ORAL
  Filled 2020-12-04: qty 2

## 2020-12-04 MED ORDER — SODIUM CHLORIDE 0.9 % IV SOLN
Freq: Once | INTRAVENOUS | Status: AC
  Filled 2020-12-04: qty 250

## 2020-12-04 MED ORDER — SODIUM CHLORIDE 0.9 % IV SOLN: 375.0000 mg/m2 | Freq: Once | INTRAVENOUS | Status: DC

## 2020-12-04 MED ORDER — SODIUM CHLORIDE 0.9 % IV SOLN
375.0000 mg/m2 | Freq: Once | INTRAVENOUS | Status: AC
  Administered 2020-12-04: 700 mg via INTRAVENOUS
  Filled 2020-12-04: qty 50

## 2020-12-04 MED ORDER — DIPHENHYDRAMINE HCL 25 MG PO CAPS
50.0000 mg | ORAL_CAPSULE | Freq: Once | ORAL | Status: AC
  Administered 2020-12-04: 50 mg via ORAL
  Filled 2020-12-04: qty 2

## 2020-12-10 NOTE — Progress Notes (Signed)
Error.   Not seen.

## 2020-12-12 ENCOUNTER — Inpatient Hospital Stay (HOSPITAL_BASED_OUTPATIENT_CLINIC_OR_DEPARTMENT_OTHER): Payer: BC Managed Care – PPO | Admitting: Oncology

## 2020-12-12 ENCOUNTER — Other Ambulatory Visit: Payer: Self-pay

## 2020-12-12 ENCOUNTER — Inpatient Hospital Stay: Payer: BC Managed Care – PPO

## 2020-12-12 ENCOUNTER — Inpatient Hospital Stay: Payer: BC Managed Care – PPO | Attending: Oncology

## 2020-12-12 ENCOUNTER — Encounter: Payer: Self-pay | Admitting: Oncology

## 2020-12-12 VITALS — BP 106/64 | HR 71 | Resp 18

## 2020-12-12 VITALS — BP 117/67 | HR 74 | Temp 97.2°F | Resp 16 | Wt 170.1 lb

## 2020-12-12 DIAGNOSIS — D47Z9 Other specified neoplasms of uncertain behavior of lymphoid, hematopoietic and related tissue: Secondary | ICD-10-CM | POA: Diagnosis present

## 2020-12-12 DIAGNOSIS — D479 Neoplasm of uncertain behavior of lymphoid, hematopoietic and related tissue, unspecified: Secondary | ICD-10-CM

## 2020-12-12 DIAGNOSIS — D696 Thrombocytopenia, unspecified: Secondary | ICD-10-CM

## 2020-12-12 DIAGNOSIS — D472 Monoclonal gammopathy: Secondary | ICD-10-CM

## 2020-12-12 DIAGNOSIS — Z5112 Encounter for antineoplastic immunotherapy: Secondary | ICD-10-CM | POA: Diagnosis not present

## 2020-12-12 LAB — CBC WITH DIFFERENTIAL/PLATELET
Abs Immature Granulocytes: 0.05 10*3/uL (ref 0.00–0.07)
Basophils Absolute: 0.1 10*3/uL (ref 0.0–0.1)
Basophils Relative: 1 %
Eosinophils Absolute: 0.1 10*3/uL (ref 0.0–0.5)
Eosinophils Relative: 1 %
HCT: 45.1 % (ref 39.0–52.0)
Hemoglobin: 15.1 g/dL (ref 13.0–17.0)
Immature Granulocytes: 1 %
Lymphocytes Relative: 23 %
Lymphs Abs: 2 10*3/uL (ref 0.7–4.0)
MCH: 27.7 pg (ref 26.0–34.0)
MCHC: 33.5 g/dL (ref 30.0–36.0)
MCV: 82.8 fL (ref 80.0–100.0)
Monocytes Absolute: 0.6 10*3/uL (ref 0.1–1.0)
Monocytes Relative: 7 %
Neutro Abs: 5.9 10*3/uL (ref 1.7–7.7)
Neutrophils Relative %: 67 %
Platelets: 73 10*3/uL — ABNORMAL LOW (ref 150–400)
RBC: 5.45 MIL/uL (ref 4.22–5.81)
RDW: 13.1 % (ref 11.5–15.5)
WBC: 8.6 10*3/uL (ref 4.0–10.5)
nRBC: 0 % (ref 0.0–0.2)

## 2020-12-12 LAB — COMPREHENSIVE METABOLIC PANEL
ALT: 17 U/L (ref 0–44)
AST: 15 U/L (ref 15–41)
Albumin: 4.4 g/dL (ref 3.5–5.0)
Alkaline Phosphatase: 46 U/L (ref 38–126)
Anion gap: 10 (ref 5–15)
BUN: 22 mg/dL — ABNORMAL HIGH (ref 6–20)
CO2: 27 mmol/L (ref 22–32)
Calcium: 9 mg/dL (ref 8.9–10.3)
Chloride: 100 mmol/L (ref 98–111)
Creatinine, Ser: 0.99 mg/dL (ref 0.61–1.24)
GFR, Estimated: >60 mL/min (ref >60)
Glucose, Bld: 64 mg/dL — ABNORMAL LOW (ref 70–99)
Potassium: 3.9 mmol/L (ref 3.5–5.1)
Sodium: 137 mmol/L (ref 135–145)
Total Bilirubin: 0.8 mg/dL (ref 0.3–1.2)
Total Protein: 7.2 g/dL (ref 6.5–8.1)

## 2020-12-12 MED ORDER — SODIUM CHLORIDE 0.9 % IV SOLN: 375.0000 mg/m2 | Freq: Once | INTRAVENOUS | Status: DC

## 2020-12-12 MED ORDER — ACETAMINOPHEN 325 MG PO TABS
650.0000 mg | ORAL_TABLET | Freq: Once | ORAL | Status: AC
  Administered 2020-12-12: 650 mg via ORAL
  Filled 2020-12-12: qty 2

## 2020-12-12 MED ORDER — SODIUM CHLORIDE 0.9 % IV SOLN
Freq: Once | INTRAVENOUS | Status: AC
  Filled 2020-12-12: qty 250

## 2020-12-12 MED ORDER — SODIUM CHLORIDE 0.9 % IV SOLN
375.0000 mg/m2 | Freq: Once | INTRAVENOUS | Status: AC
  Administered 2020-12-12: 700 mg via INTRAVENOUS
  Filled 2020-12-12: qty 50

## 2020-12-12 MED ORDER — DIPHENHYDRAMINE HCL 25 MG PO CAPS
50.0000 mg | ORAL_CAPSULE | Freq: Once | ORAL | Status: AC
  Administered 2020-12-12: 50 mg via ORAL
  Filled 2020-12-12: qty 2

## 2020-12-12 NOTE — Progress Notes (Signed)
Hematology/Oncology follow up  note [facility] Telephone:([phone] Fax:([phone]   Patient Care Team: Mar Daring, PA-C as PCP - General (Family Medicine) Earlie Server, MD as Consulting Physician (Hematology and Oncology)  REFERRING PROVIDER: Mar Daring, PA-C REASON FOR VISIT Follow up for treatment of thrombocytopenia  HISTORY OF PRESENTING ILLNESS:  Steven Knight is a  50 y.o.  male with PMH listed below who was referred to me for evaluation of thrombocytopenia  Patient recently had lab work done on 07/22/2018 which revealed thrombocytopenia, platelet counts 81 with comments at 2 platelet counts may be somewhat higher than reported due to aggregation of platelets in December. WBC 10.2, hemoglobin 14.9, MCV 86, neutrophil absolute 7.3 [normal ref 1.4-7.0] Creatinine 1.13, sodium 144, potassium 4.3, calcium 9.6, albumin 4.5, bilirubin 0.3, alkaline phosphatase 46, AST ALT normal.  Per patient's wife, patient's thrombocytopenia has been chronic.  They were told that patient had thrombocytopenia for about 1 to 2 years.  No bleeding events. No aggravating or improving factors.  Associated symptoms: Patient denies fatigue, weight loss, easy bruising, hema tochezia, hemoptysis.  History hepatitis or HIV infection.  Denies History of chronic liver disease denies Alcohol consumption denies  Patient has autism and bipolar disease.  Poor historian Patientis now on Abilify . for about a year.  Off Depakote.   History is obtained from wife.  # had a course of 4 days of dexamethasone and the platelet count normalized to 160.000 However dropped back to 47,000. Appetite is fair.   INTERVAL HISTORY Steven Knight is a 50 y.o. male who has above history reviewed by me today presents for follow-up of thrombocytopenia, low-grade B-cell lymphoproliferative disorder Patient status post 2 weekly treatments of rituximab.  He tolerates  well. He was accompanied by his wife.  No new complaints.  .Review of Systems  Unable to perform ROS: Psychiatric disorder  Constitutional: Negative for fever, malaise/fatigue and weight loss.  Respiratory: Negative for cough.   Cardiovascular: Negative for palpitations and leg swelling.  Gastrointestinal: Negative for nausea and vomiting.  Genitourinary: Negative for urgency.  Skin: Negative for rash.  Neurological: Negative for dizziness.  Endo/Heme/Allergies: Does not bruise/bleed easily.  Psychiatric/Behavioral: The patient is not nervous/anxious.     MEDICAL HISTORY:  Past Medical History:  Diagnosis Date  . Anxiety   . Autism   . Autism   . Bipolar 1 disorder ([place])   . Depression     SURGICAL HISTORY: Past Surgical History:  Procedure Laterality Date  . EYE SURGERY     No surgery SOCIAL HISTORY: Social History   Socioeconomic History  . Marital status: Married    Spouse name: Not on file  . Number of children: Not on file  . Years of education: Not on file  . Highest education level: Not on file  Occupational History  . Not on file  Tobacco Use  . Smoking status: Never Smoker  . Smokeless tobacco: Never Used  Vaping Use  . Vaping Use: Never used  Substance and Sexual Activity  . Alcohol use: Never  . Drug use: Never  . Sexual activity: Yes  Other Topics Concern  . Not on file  Social History Narrative  . Not on file   Social Determinants of Health   Financial Resource Strain: Not on file  Food Insecurity: Not on file  Transportation Needs: Not on file  Physical Activity: Not on file  Stress: Not on file  Social Connections: Not on file  Intimate Partner Violence: Not on file    FAMILY HISTORY: Family History  Problem Relation Age of Onset  . Diabetes Mother   . Dementia Paternal Grandfather     ALLERGIES:  has No Known Allergies.  MEDICATIONS:  Current Outpatient Medications  Medication Sig Dispense Refill  . ARIPiprazole (ABILIFY)  20 MG tablet Take 1 tablet (20 mg total) by mouth daily. 30 tablet 1  . LORazepam (ATIVAN) 0.5 MG tablet Take 1 tablet (0.5 mg total) by mouth every 8 (eight) hours as needed for anxiety. 90 tablet 1  . mirtazapine (REMERON) 30 MG tablet Take 1 tablet (30 mg total) by mouth at bedtime. 90 tablet 1  . propranolol (INDERAL) 10 MG tablet Take 1 tablet (10 mg total) by mouth 2 (two) times daily.    Marland Kitchen dexamethasone (DECADRON) 4 MG tablet Take 10 tablets (40 mg total) by mouth daily. (Patient not taking: No sig reported) 40 tablet 0   No current facility-administered medications for this visit.     PHYSICAL EXAMINATION: ECOG PERFORMANCE STATUS: 1 - Symptomatic but completely ambulatory Vitals:   12/12/20 0910  BP: 117/67  Pulse: 74  Resp: 16  Temp: (!) 97.2 F (36.2 C)   Filed Weights   12/12/20 0910  Weight: 170 lb 1.6 oz (77.2 kg)    Physical Exam Constitutional:      General: He is not in acute distress. HENT:     Head: Normocephalic and atraumatic.  Eyes:     General: No scleral icterus.    Pupils: Pupils are equal, round, and reactive to light.  Cardiovascular:     Rate and Rhythm: Normal rate and regular rhythm.     Heart sounds: Normal heart sounds.  Pulmonary:     Effort: Pulmonary effort is normal. No respiratory distress.     Breath sounds: No wheezing.  Abdominal:     General: Bowel sounds are normal.     Palpations: Abdomen is soft.  Musculoskeletal:        General: No deformity. Normal range of motion.     Cervical back: Normal range of motion and neck supple.  Skin:    General: Skin is warm and dry.     Findings: No erythema or rash.  Neurological:     Mental Status: He is alert and oriented to person, place, and time.     Cranial Nerves: No cranial nerve deficit.     Coordination: Coordination normal.  Psychiatric:     Comments: Able to answer questions,       LABORATORY DATA:  I have reviewed the data as listed Lab Results  Component Value Date    WBC 8.6 12/12/2020   HGB 15.1 12/12/2020   HCT 45.1 12/12/2020   MCV 82.8 12/12/2020   PLT 73 (L) 12/12/2020   Recent Labs    09/24/20 1420 09/27/20 0850 11/22/20 0751 12/12/20 0847  NA 144 141 135 137  K 3.9 4.4 4.0 3.9  CL 103 104 99 100  CO2 27 31 27 27   GLUCOSE 87 65* 132* 64*  BUN 13 23* 22* 22*  CREATININE 0.84 0.93 0.97 0.99  CALCIUM 9.4 9.2 9.3 9.0  GFRNONAA 104 >60 >60 >60  GFRAA 121  --   --   --   PROT 7.0 7.4 7.6 7.2  ALBUMIN 4.5 4.2 4.4 4.4  AST 16 15 15 15   ALT 20 20 19 17   ALKPHOS 78 61 47 46  BILITOT 0.6 0.7 1.1 0.8   Iron/TIBC/Ferritin/ %  Sat No results found for: IRON, TIBC, FERRITIN, IRONPCTSAT    Normal LDH, negative hepatitis and HIV, normal B12 and folate level, normal spleen size. Negative flowcytometry. Patient has history of positive anti-TPO.  Patient was seen by rheumatologist last year.  ASSESSMENT & PLAN:  1. B-cell lymphoproliferative disorder ([place])   2. Thrombocytopenia ([facility])    #Thrombocytopenia, due to peripheral destruction/sequestion or consumption.  primary ITP versus secondary ITP related to underlying B-cell lymphoproliferative disorder.  See below.  Previous bone marrow biopsy indicates underlying B-cell lymphoproliferative disorder.  + B-cell rearrangement, - MYD88, - t(11,18) Cytogenetics is normal.  IgM MGUS, monoclonal lymphocytosis, mild splenomegaly. Status post 2 weekly rituximab.  Tolerates well. Platelet count has improved to 73,000 today.  Recommend to proceed with another 2 weekly rituximab treatment. Afterwards plan to monitor CBC every 2 weeks Patient will follow up in 6 to 7 weeks.   Return of visit: I will see patient during the first week of January for reevaluation and additional rituximab. Earlie Server, MD, PhD Hematology Oncology [facility] at [facility] Pager- [phone] 12/12/2020

## 2020-12-12 NOTE — Progress Notes (Signed)
Pt tolerated treatment well with no signs of complications. VSS. Pt stable for discharge. RN educated pt on the importance of notifying the clinic if any complications occur at home, pt verbalized understanding.   Lauren [employer]

## 2020-12-12 NOTE — Progress Notes (Signed)
Patient denies new problems/concerns today.   °

## 2020-12-19 ENCOUNTER — Inpatient Hospital Stay: Payer: BC Managed Care – PPO

## 2020-12-19 ENCOUNTER — Other Ambulatory Visit: Payer: Self-pay

## 2020-12-19 ENCOUNTER — Other Ambulatory Visit: Payer: Self-pay | Admitting: Oncology

## 2020-12-19 VITALS — BP 112/73 | HR 73 | Temp 97.8°F | Resp 16

## 2020-12-19 DIAGNOSIS — D479 Neoplasm of uncertain behavior of lymphoid, hematopoietic and related tissue, unspecified: Secondary | ICD-10-CM

## 2020-12-19 DIAGNOSIS — Z5112 Encounter for antineoplastic immunotherapy: Secondary | ICD-10-CM | POA: Diagnosis not present

## 2020-12-19 LAB — CBC WITH DIFFERENTIAL/PLATELET
Abs Immature Granulocytes: 0.03 10*3/uL (ref 0.00–0.07)
Basophils Absolute: 0.1 10*3/uL (ref 0.0–0.1)
Basophils Relative: 1 %
Eosinophils Absolute: 0.1 10*3/uL (ref 0.0–0.5)
Eosinophils Relative: 1 %
HCT: 44.4 % (ref 39.0–52.0)
Hemoglobin: 15.3 g/dL (ref 13.0–17.0)
Immature Granulocytes: 0 %
Lymphocytes Relative: 26 %
Lymphs Abs: 2.1 10*3/uL (ref 0.7–4.0)
MCH: 28.5 pg (ref 26.0–34.0)
MCHC: 34.5 g/dL (ref 30.0–36.0)
MCV: 82.7 fL (ref 80.0–100.0)
Monocytes Absolute: 0.6 10*3/uL (ref 0.1–1.0)
Monocytes Relative: 7 %
Neutro Abs: 5 10*3/uL (ref 1.7–7.7)
Neutrophils Relative %: 65 %
Platelets: 59 10*3/uL — ABNORMAL LOW (ref 150–400)
RBC: 5.37 MIL/uL (ref 4.22–5.81)
RDW: 12.9 % (ref 11.5–15.5)
WBC: 7.8 10*3/uL (ref 4.0–10.5)
nRBC: 0 % (ref 0.0–0.2)

## 2020-12-19 MED ORDER — SODIUM CHLORIDE 0.9 % IV SOLN
Freq: Once | INTRAVENOUS | Status: AC
  Filled 2020-12-19: qty 250

## 2020-12-19 MED ORDER — SODIUM CHLORIDE 0.9 % IV SOLN
375.0000 mg/m2 | Freq: Once | INTRAVENOUS | Status: AC
  Administered 2020-12-19: 700 mg via INTRAVENOUS
  Filled 2020-12-19: qty 50

## 2020-12-19 MED ORDER — DIPHENHYDRAMINE HCL 25 MG PO CAPS
50.0000 mg | ORAL_CAPSULE | Freq: Once | ORAL | Status: AC
  Administered 2020-12-19: 50 mg via ORAL
  Filled 2020-12-19: qty 2

## 2020-12-19 MED ORDER — SODIUM CHLORIDE 0.9 % IV SOLN: 375.0000 mg/m2 | Freq: Once | INTRAVENOUS | Status: DC

## 2020-12-19 MED ORDER — ACETAMINOPHEN 325 MG PO TABS
650.0000 mg | ORAL_TABLET | Freq: Once | ORAL | Status: AC
  Administered 2020-12-19: 650 mg via ORAL
  Filled 2020-12-19: qty 2

## 2020-12-19 NOTE — Progress Notes (Signed)
Plt 59. Per Dr. Tasia Catchings, okay to proceed with treatment.   Patient tolerated infusion well. Patient and VSS. Discharged home.

## 2021-01-02 ENCOUNTER — Inpatient Hospital Stay: Payer: BC Managed Care – PPO

## 2021-01-02 DIAGNOSIS — Z5112 Encounter for antineoplastic immunotherapy: Secondary | ICD-10-CM | POA: Diagnosis not present

## 2021-01-02 DIAGNOSIS — D479 Neoplasm of uncertain behavior of lymphoid, hematopoietic and related tissue, unspecified: Secondary | ICD-10-CM

## 2021-01-02 LAB — CBC WITH DIFFERENTIAL/PLATELET
Abs Immature Granulocytes: 0.1 10*3/uL — ABNORMAL HIGH (ref 0.00–0.07)
Basophils Absolute: 0.1 10*3/uL (ref 0.0–0.1)
Basophils Relative: 1 %
Eosinophils Absolute: 0.1 10*3/uL (ref 0.0–0.5)
Eosinophils Relative: 1 %
HCT: 44.2 % (ref 39.0–52.0)
Hemoglobin: 14.9 g/dL (ref 13.0–17.0)
Immature Granulocytes: 1 %
Lymphocytes Relative: 22 %
Lymphs Abs: 2.2 10*3/uL (ref 0.7–4.0)
MCH: 28.1 pg (ref 26.0–34.0)
MCHC: 33.7 g/dL (ref 30.0–36.0)
MCV: 83.2 fL (ref 80.0–100.0)
Monocytes Absolute: 0.7 10*3/uL (ref 0.1–1.0)
Monocytes Relative: 8 %
Neutro Abs: 6.5 10*3/uL (ref 1.7–7.7)
Neutrophils Relative %: 67 %
Platelets: 73 10*3/uL — ABNORMAL LOW (ref 150–400)
RBC: 5.31 MIL/uL (ref 4.22–5.81)
RDW: 13.2 % (ref 11.5–15.5)
WBC: 9.7 10*3/uL (ref 4.0–10.5)
nRBC: 0 % (ref 0.0–0.2)

## 2021-01-16 ENCOUNTER — Inpatient Hospital Stay: Payer: BC Managed Care – PPO | Attending: Oncology

## 2021-01-16 DIAGNOSIS — D479 Neoplasm of uncertain behavior of lymphoid, hematopoietic and related tissue, unspecified: Secondary | ICD-10-CM | POA: Diagnosis not present

## 2021-01-16 DIAGNOSIS — D472 Monoclonal gammopathy: Secondary | ICD-10-CM | POA: Diagnosis not present

## 2021-01-16 DIAGNOSIS — D696 Thrombocytopenia, unspecified: Secondary | ICD-10-CM | POA: Insufficient documentation

## 2021-01-16 LAB — CBC WITH DIFFERENTIAL/PLATELET
Abs Immature Granulocytes: 0.02 10*3/uL (ref 0.00–0.07)
Basophils Absolute: 0.1 10*3/uL (ref 0.0–0.1)
Basophils Relative: 1 %
Eosinophils Absolute: 0.1 10*3/uL (ref 0.0–0.5)
Eosinophils Relative: 1 %
HCT: 44.8 % (ref 39.0–52.0)
Hemoglobin: 15.4 g/dL (ref 13.0–17.0)
Immature Granulocytes: 0 %
Lymphocytes Relative: 22 %
Lymphs Abs: 1.9 10*3/uL (ref 0.7–4.0)
MCH: 28.5 pg (ref 26.0–34.0)
MCHC: 34.4 g/dL (ref 30.0–36.0)
MCV: 83 fL (ref 80.0–100.0)
Monocytes Absolute: 0.7 10*3/uL (ref 0.1–1.0)
Monocytes Relative: 8 %
Neutro Abs: 5.9 10*3/uL (ref 1.7–7.7)
Neutrophils Relative %: 68 %
Platelets: 83 10*3/uL — ABNORMAL LOW (ref 150–400)
RBC: 5.4 MIL/uL (ref 4.22–5.81)
RDW: 13.1 % (ref 11.5–15.5)
WBC: 8.6 10*3/uL (ref 4.0–10.5)
nRBC: 0 % (ref 0.0–0.2)

## 2021-01-30 ENCOUNTER — Encounter: Payer: Self-pay | Admitting: Oncology

## 2021-01-30 ENCOUNTER — Other Ambulatory Visit: Payer: Self-pay

## 2021-01-30 ENCOUNTER — Inpatient Hospital Stay (HOSPITAL_BASED_OUTPATIENT_CLINIC_OR_DEPARTMENT_OTHER): Payer: BC Managed Care – PPO | Admitting: Oncology

## 2021-01-30 ENCOUNTER — Inpatient Hospital Stay: Payer: BC Managed Care – PPO

## 2021-01-30 VITALS — BP 94/66 | HR 76 | Temp 97.8°F | Resp 18 | Wt 165.1 lb

## 2021-01-30 DIAGNOSIS — D479 Neoplasm of uncertain behavior of lymphoid, hematopoietic and related tissue, unspecified: Secondary | ICD-10-CM

## 2021-01-30 DIAGNOSIS — D696 Thrombocytopenia, unspecified: Secondary | ICD-10-CM | POA: Diagnosis not present

## 2021-01-30 DIAGNOSIS — D472 Monoclonal gammopathy: Secondary | ICD-10-CM | POA: Diagnosis not present

## 2021-01-30 LAB — COMPREHENSIVE METABOLIC PANEL
ALT: 62 U/L — ABNORMAL HIGH (ref 0–44)
AST: 35 U/L (ref 15–41)
Albumin: 3.6 g/dL (ref 3.5–5.0)
Alkaline Phosphatase: 67 U/L (ref 38–126)
Anion gap: 9 (ref 5–15)
BUN: 17 mg/dL (ref 6–20)
CO2: 30 mmol/L (ref 22–32)
Calcium: 8.7 mg/dL — ABNORMAL LOW (ref 8.9–10.3)
Chloride: 102 mmol/L (ref 98–111)
Creatinine, Ser: 0.9 mg/dL (ref 0.61–1.24)
GFR, Estimated: >60 mL/min (ref >60)
Glucose, Bld: 86 mg/dL (ref 70–99)
Potassium: 4.2 mmol/L (ref 3.5–5.1)
Sodium: 141 mmol/L (ref 135–145)
Total Bilirubin: 0.7 mg/dL (ref 0.3–1.2)
Total Protein: 6.5 g/dL (ref 6.5–8.1)

## 2021-01-30 LAB — CBC WITH DIFFERENTIAL/PLATELET
Abs Immature Granulocytes: 0.15 10*3/uL — ABNORMAL HIGH (ref 0.00–0.07)
Basophils Absolute: 0.1 10*3/uL (ref 0.0–0.1)
Basophils Relative: 1 %
Eosinophils Absolute: 0.1 10*3/uL (ref 0.0–0.5)
Eosinophils Relative: 1 %
HCT: 40 % (ref 39.0–52.0)
Hemoglobin: 12.8 g/dL — ABNORMAL LOW (ref 13.0–17.0)
Immature Granulocytes: 2 %
Lymphocytes Relative: 21 %
Lymphs Abs: 1.8 10*3/uL (ref 0.7–4.0)
MCH: 27.4 pg (ref 26.0–34.0)
MCHC: 32 g/dL (ref 30.0–36.0)
MCV: 85.5 fL (ref 80.0–100.0)
Monocytes Absolute: 0.7 10*3/uL (ref 0.1–1.0)
Monocytes Relative: 8 %
Neutro Abs: 6 10*3/uL (ref 1.7–7.7)
Neutrophils Relative %: 67 %
Platelets: 219 10*3/uL (ref 150–400)
RBC: 4.68 MIL/uL (ref 4.22–5.81)
RDW: 13 % (ref 11.5–15.5)
WBC: 8.9 10*3/uL (ref 4.0–10.5)
nRBC: 0 % (ref 0.0–0.2)

## 2021-01-30 NOTE — Progress Notes (Signed)
Hematology/Oncology follow up  note [facility] Telephone:(336) [phone] Fax:(336) [phone]   Patient Care Team: Mar Daring, PA-C as PCP - General (Family Medicine) Earlie Server, MD as Consulting Physician (Hematology and Oncology)  REFERRING PROVIDER: Mar Daring, PA-C REASON FOR VISIT Follow up for treatment of thrombocytopenia  HISTORY OF PRESENTING ILLNESS:  Steven Knight is a  50 y.o.  male with PMH listed below who was referred to me for evaluation of thrombocytopenia  Patient recently had lab work done on 07/22/2018 which revealed thrombocytopenia, platelet counts 81 with comments at 2 platelet counts may be somewhat higher than reported due to aggregation of platelets in December. WBC 10.2, hemoglobin 14.9, MCV 86, neutrophil absolute 7.3 [normal ref 1.4-7.0] Creatinine 1.13, sodium 144, potassium 4.3, calcium 9.6, albumin 4.5, bilirubin 0.3, alkaline phosphatase 46, AST ALT normal.  Per patient's wife, patient's thrombocytopenia has been chronic.  They were told that patient had thrombocytopenia for about 1 to 2 years.  No bleeding events. No aggravating or improving factors.  Associated symptoms: Patient denies fatigue, weight loss, easy bruising, hema tochezia, hemoptysis.  History hepatitis or HIV infection.  Denies History of chronic liver disease denies Alcohol consumption denies  Patient has autism and bipolar disease.  Poor historian Patientis now on Abilify . for about a year.  Off Depakote.   History is obtained from wife.  # had a course of 4 days of dexamethasone and the platelet count normalized to 160.000 However dropped back to 47,000. Appetite is fair.   INTERVAL HISTORY Steven Knight is a 50 y.o. male who has above history reviewed by me today presents for follow-up of thrombocytopenia, low-grade B-cell lymphoproliferative disorder Patient status post  weekly treatments of rituximab x 4.  He  tolerates well. He was accompanied by his wife.  No new complaints.  .Review of Systems  Unable to perform ROS: Psychiatric disorder  Constitutional: Negative for fever, malaise/fatigue and weight loss.  Respiratory: Negative for cough.   Cardiovascular: Negative for palpitations and leg swelling.  Gastrointestinal: Negative for nausea and vomiting.  Genitourinary: Negative for urgency.  Skin: Negative for rash.  Neurological: Negative for dizziness.  Endo/Heme/Allergies: Does not bruise/bleed easily.  Psychiatric/Behavioral: The patient is not nervous/anxious.     MEDICAL HISTORY:  Past Medical History:  Diagnosis Date  . Anxiety   . Autism   . Autism   . Bipolar 1 disorder ([place])   . Depression     SURGICAL HISTORY: Past Surgical History:  Procedure Laterality Date  . EYE SURGERY     No surgery SOCIAL HISTORY: Social History   Socioeconomic History  . Marital status: Married    Spouse name: Not on file  . Number of children: Not on file  . Years of education: Not on file  . Highest education level: Not on file  Occupational History  . Not on file  Tobacco Use  . Smoking status: Never Smoker  . Smokeless tobacco: Never Used  Vaping Use  . Vaping Use: Never used  Substance and Sexual Activity  . Alcohol use: Never  . Drug use: Never  . Sexual activity: Yes  Other Topics Concern  . Not on file  Social History Narrative  . Not on file   Social Determinants of Health   Financial Resource Strain: Not on file  Food Insecurity: Not on file  Transportation Needs: Not on file  Physical Activity: Not on file  Stress: Not on file  Social Connections: Not on  file  Intimate Partner Violence: Not on file    FAMILY HISTORY: Family History  Problem Relation Age of Onset  . Diabetes Mother   . Dementia Paternal Grandfather     ALLERGIES:  has No Known Allergies.  MEDICATIONS:  Current Outpatient Medications  Medication Sig Dispense Refill  . ARIPiprazole  (ABILIFY) 20 MG tablet Take 1 tablet (20 mg total) by mouth daily. 30 tablet 1  . LORazepam (ATIVAN) 0.5 MG tablet Take 1 tablet (0.5 mg total) by mouth every 8 (eight) hours as needed for anxiety. 90 tablet 1  . mirtazapine (REMERON) 30 MG tablet Take 1 tablet (30 mg total) by mouth at bedtime. 90 tablet 1  . propranolol (INDERAL) 10 MG tablet Take 1 tablet (10 mg total) by mouth 2 (two) times daily.    Marland Kitchen dexamethasone (DECADRON) 4 MG tablet Take 10 tablets (40 mg total) by mouth daily. (Patient not taking: No sig reported) 40 tablet 0   No current facility-administered medications for this visit.     PHYSICAL EXAMINATION: ECOG PERFORMANCE STATUS: 1 - Symptomatic but completely ambulatory Vitals:   01/30/21 0957  BP: 94/66  Pulse: 76  Resp: 18  Temp: 97.8 F (36.6 C)   Filed Weights   01/30/21 0957  Weight: 165 lb 1.6 oz (74.9 kg)    Physical Exam Constitutional:      General: He is not in acute distress. HENT:     Head: Normocephalic and atraumatic.  Eyes:     General: No scleral icterus.    Pupils: Pupils are equal, round, and reactive to light.  Cardiovascular:     Rate and Rhythm: Normal rate and regular rhythm.     Heart sounds: Normal heart sounds.  Pulmonary:     Effort: Pulmonary effort is normal. No respiratory distress.     Breath sounds: No wheezing.  Abdominal:     General: Bowel sounds are normal.     Palpations: Abdomen is soft.  Musculoskeletal:        General: No deformity. Normal range of motion.     Cervical back: Normal range of motion and neck supple.  Skin:    General: Skin is warm and dry.     Findings: No erythema or rash.  Neurological:     Mental Status: He is alert and oriented to person, place, and time.     Cranial Nerves: No cranial nerve deficit.     Coordination: Coordination normal.  Psychiatric:     Comments: Able to answer questions,       LABORATORY DATA:  I have reviewed the data as listed Lab Results  Component Value  Date   WBC 8.9 01/30/2021   HGB 12.8 (L) 01/30/2021   HCT 40.0 01/30/2021   MCV 85.5 01/30/2021   PLT 219 01/30/2021   Recent Labs    09/24/20 1420 09/27/20 0850 11/22/20 0751 12/12/20 0847 01/30/21 0926  NA 144   < > 135 137 141  K 3.9   < > 4.0 3.9 4.2  CL 103   < > 99 100 102  CO2 27   < > 27 27 30   GLUCOSE 87   < > 132* 64* 86  BUN 13   < > 22* 22* 17  CREATININE 0.84   < > 0.97 0.99 0.90  CALCIUM 9.4   < > 9.3 9.0 8.7*  GFRNONAA 104   < > >60 >60 >60  GFRAA 121  --   --   --   --  PROT 7.0   < > 7.6 7.2 6.5  ALBUMIN 4.5   < > 4.4 4.4 3.6  AST 16   < > 15 15 35  ALT 20   < > 19 17 62*  ALKPHOS 78   < > 47 46 67  BILITOT 0.6   < > 1.1 0.8 0.7   < > = values in this interval not displayed.   Iron/TIBC/Ferritin/ %Sat No results found for: IRON, TIBC, FERRITIN, IRONPCTSAT    Normal LDH, negative hepatitis and HIV, normal B12 and folate level, normal spleen size. Negative flowcytometry. Patient has history of positive anti-TPO.  Patient was seen by rheumatologist last year.  ASSESSMENT & PLAN:  1. Lymphoproliferative disease ([place])   2. Thrombocytopenia ([place])   3. MGUS (monoclonal gammopathy of unknown significance)    #Thrombocytopenia, due to peripheral destruction/sequestion or consumption.  primary ITP versus secondary ITP related to underlying B-cell lymphoproliferative disorder.  See below.  Previous bone marrow biopsy indicates underlying B-cell lymphoproliferative disorder.  + B-cell rearrangement, - MYD88, - t(11,18) Cytogenetics is normal.  IgM MGUS, monoclonal lymphocytosis, mild splenomegaly. Status post weekly rituximab x 4   Patient tolerates well. Labs reviewed and discussed with patient and wife. Thrombocytopenia has completely resolved and normalized.  I recommend continue observation.  Repeat blood work in 3 months and follow-up in the clinic.   Earlie Server, MD, PhD Hematology Oncology [facility] at [facility] Pager-  [phone] 01/30/2021

## 2021-01-30 NOTE — Progress Notes (Signed)
Patient here for follow up. No new concerns voiced.  °

## 2021-04-26 ENCOUNTER — Inpatient Hospital Stay: Payer: BC Managed Care – PPO | Attending: Oncology

## 2021-04-26 ENCOUNTER — Other Ambulatory Visit: Payer: Self-pay

## 2021-04-26 DIAGNOSIS — D693 Immune thrombocytopenic purpura: Secondary | ICD-10-CM | POA: Diagnosis not present

## 2021-04-26 DIAGNOSIS — D696 Thrombocytopenia, unspecified: Secondary | ICD-10-CM

## 2021-04-26 DIAGNOSIS — D472 Monoclonal gammopathy: Secondary | ICD-10-CM

## 2021-04-26 DIAGNOSIS — D479 Neoplasm of uncertain behavior of lymphoid, hematopoietic and related tissue, unspecified: Secondary | ICD-10-CM

## 2021-04-26 LAB — CBC WITH DIFFERENTIAL/PLATELET
Abs Immature Granulocytes: 0.01 10*3/uL (ref 0.00–0.07)
Basophils Absolute: 0 10*3/uL (ref 0.0–0.1)
Basophils Relative: 1 %
Eosinophils Absolute: 0.1 10*3/uL (ref 0.0–0.5)
Eosinophils Relative: 1 %
HCT: 41.9 % (ref 39.0–52.0)
Hemoglobin: 13.7 g/dL (ref 13.0–17.0)
Immature Granulocytes: 0 %
Lymphocytes Relative: 23 %
Lymphs Abs: 1.5 10*3/uL (ref 0.7–4.0)
MCH: 27.3 pg (ref 26.0–34.0)
MCHC: 32.7 g/dL (ref 30.0–36.0)
MCV: 83.6 fL (ref 80.0–100.0)
Monocytes Absolute: 0.6 10*3/uL (ref 0.1–1.0)
Monocytes Relative: 10 %
Neutro Abs: 4.3 10*3/uL (ref 1.7–7.7)
Neutrophils Relative %: 65 %
Platelets: 45 10*3/uL — ABNORMAL LOW (ref 150–400)
RBC: 5.01 MIL/uL (ref 4.22–5.81)
RDW: 14 % (ref 11.5–15.5)
WBC: 6.6 10*3/uL (ref 4.0–10.5)
nRBC: 0 % (ref 0.0–0.2)

## 2021-04-26 LAB — COMPREHENSIVE METABOLIC PANEL
ALT: 54 U/L — ABNORMAL HIGH (ref 0–44)
AST: 29 U/L (ref 15–41)
Albumin: 4.1 g/dL (ref 3.5–5.0)
Alkaline Phosphatase: 71 U/L (ref 38–126)
Anion gap: 8 (ref 5–15)
BUN: 16 mg/dL (ref 6–20)
CO2: 30 mmol/L (ref 22–32)
Calcium: 8.9 mg/dL (ref 8.9–10.3)
Chloride: 103 mmol/L (ref 98–111)
Creatinine, Ser: 1.01 mg/dL (ref 0.61–1.24)
GFR, Estimated: >60 mL/min (ref >60)
Glucose, Bld: 75 mg/dL (ref 70–99)
Potassium: 4.2 mmol/L (ref 3.5–5.1)
Sodium: 141 mmol/L (ref 135–145)
Total Bilirubin: 1 mg/dL (ref 0.3–1.2)
Total Protein: 6.8 g/dL (ref 6.5–8.1)

## 2021-04-29 ENCOUNTER — Encounter: Payer: Self-pay | Admitting: Oncology

## 2021-04-29 ENCOUNTER — Inpatient Hospital Stay (HOSPITAL_BASED_OUTPATIENT_CLINIC_OR_DEPARTMENT_OTHER): Payer: BC Managed Care – PPO | Admitting: Oncology

## 2021-04-29 DIAGNOSIS — D696 Thrombocytopenia, unspecified: Secondary | ICD-10-CM | POA: Diagnosis not present

## 2021-04-29 DIAGNOSIS — D479 Neoplasm of uncertain behavior of lymphoid, hematopoietic and related tissue, unspecified: Secondary | ICD-10-CM

## 2021-04-29 DIAGNOSIS — D472 Monoclonal gammopathy: Secondary | ICD-10-CM | POA: Diagnosis not present

## 2021-04-29 LAB — KAPPA/LAMBDA LIGHT CHAINS
Kappa free light chain: 27.9 mg/L — ABNORMAL HIGH (ref 3.3–19.4)
Kappa, lambda light chain ratio: 2.05 — ABNORMAL HIGH (ref 0.26–1.65)
Lambda free light chains: 13.6 mg/L (ref 5.7–26.3)

## 2021-04-29 NOTE — Progress Notes (Signed)
HEMATOLOGY-ONCOLOGY TeleHEALTH VISIT PROGRESS NOTE  I connected with Steven Knight on _0  @ at  2:15 PM EDT by video enabled telemedicine visit and verified that I am speaking with the correct person using two identifiers. I discussed the limitations, risks, security and privacy concerns of performing an evaluation and management service by telemedicine and the availability of in-person appointments. The patient expressed understanding and agreed to proceed.   Other persons participating in the visit and their role in the encounter:  Wife   Patient's location: Home  Provider's location: office Chief Complaint: Follow-up for thrombocytopenia   INTERVAL HISTORY Steven Knight is a 50 y.o. male who has above history reviewed by me today presents for follow up visit for management of thrombocytopenia, B-cell lymphoproliferative disorder Problems and complaints are listed below:  Patient has received rituximab previously.. Denies any unintentional weight loss, fever, night sweats.  Appetite is fair.  No new complaints.  No bleeding events  Review of Systems  Unable to perform ROS: Other (Psychiatry disorder)  Constitutional: Negative for fatigue.  Hematological:       No acute bleeding events    Past Medical History:  Diagnosis Date  . Anxiety   . Autism   . Autism   . Bipolar 1 disorder ([place])   . Depression    Past Surgical History:  Procedure Laterality Date  . EYE SURGERY      Family History  Problem Relation Age of Onset  . Diabetes Mother   . Dementia Paternal Grandfather     Social History   Socioeconomic History  . Marital status: Married    Spouse name: Not on file  . Number of children: Not on file  . Years of education: Not on file  . Highest education level: Not on file  Occupational History  . Not on file  Tobacco Use  . Smoking status: Never Smoker  . Smokeless tobacco: Never Used  Vaping Use  . Vaping Use: Never used  Substance  and Sexual Activity  . Alcohol use: Never  . Drug use: Never  . Sexual activity: Yes  Other Topics Concern  . Not on file  Social History Narrative  . Not on file   Social Determinants of Health   Financial Resource Strain: Not on file  Food Insecurity: Not on file  Transportation Needs: Not on file  Physical Activity: Not on file  Stress: Not on file  Social Connections: Not on file  Intimate Partner Violence: Not on file    Current Outpatient Medications on File Prior to Visit  Medication Sig Dispense Refill  . ARIPiprazole (ABILIFY) 20 MG tablet Take 1 tablet (20 mg total) by mouth daily. 30 tablet 1  . LORazepam (ATIVAN) 0.5 MG tablet Take 1 tablet (0.5 mg total) by mouth every 8 (eight) hours as needed for anxiety. 90 tablet 1  . mirtazapine (REMERON) 30 MG tablet Take 1 tablet (30 mg total) by mouth at bedtime. 90 tablet 1  . propranolol (INDERAL) 10 MG tablet Take 1 tablet (10 mg total) by mouth 2 (two) times daily.    Marland Kitchen dexamethasone (DECADRON) 4 MG tablet Take 10 tablets (40 mg total) by mouth daily. (Patient not taking: No sig reported) 40 tablet 0   No current facility-administered medications on file prior to visit.    No Known Allergies     Observations/Objective: Today's Vitals   04/29/21 1319  PainSc: 0-No pain   There is no height or weight on file to calculate  BMI.  Physical Exam Neurological:     Mental Status: He is alert.     CBC    Component Value Date/Time   WBC 6.6 04/26/2021 1058   RBC 5.01 04/26/2021 1058   HGB 13.7 04/26/2021 1058   HGB 14.1 09/24/2020 1420   HCT 41.9 04/26/2021 1058   HCT 41.3 09/24/2020 1420   PLT 45 (L) 04/26/2021 1058   PLT 14 (LL) 09/24/2020 1420   MCV 83.6 04/26/2021 1058   MCV 82 09/24/2020 1420   MCH 27.3 04/26/2021 1058   MCHC 32.7 04/26/2021 1058   RDW 14.0 04/26/2021 1058   RDW 12.7 09/24/2020 1420   LYMPHSABS 1.5 04/26/2021 1058   LYMPHSABS 2.9 09/24/2020 1420   MONOABS 0.6 04/26/2021 1058   EOSABS  0.1 04/26/2021 1058   EOSABS 0.1 09/24/2020 1420   BASOSABS 0.0 04/26/2021 1058   BASOSABS 0.1 09/24/2020 1420    CMP     Component Value Date/Time   NA 141 04/26/2021 1058   NA 144 09/24/2020 1420   K 4.2 04/26/2021 1058   CL 103 04/26/2021 1058   CO2 30 04/26/2021 1058   GLUCOSE 75 04/26/2021 1058   BUN 16 04/26/2021 1058   BUN 13 09/24/2020 1420   CREATININE 1.01 04/26/2021 1058   CALCIUM 8.9 04/26/2021 1058   PROT 6.8 04/26/2021 1058   PROT 7.0 09/24/2020 1420   ALBUMIN 4.1 04/26/2021 1058   ALBUMIN 4.5 09/24/2020 1420   AST 29 04/26/2021 1058   ALT 54 (H) 04/26/2021 1058   ALKPHOS 71 04/26/2021 1058   BILITOT 1.0 04/26/2021 1058   BILITOT 0.6 09/24/2020 1420   GFRNONAA >60 04/26/2021 1058   GFRAA 121 09/24/2020 1420     Assessment and Plan: 1. Thrombocytopenia ([place])   2. MGUS (monoclonal gammopathy of unknown significance)   3. B-cell lymphoproliferative disorder ([facility])     Thrombocytopenia #Labs reviewed and discussed with patient. CBC showed platelet count has decreased to 45,000.  No acute active bleeding events. Recommend continue close observation.  Repeat CBC in 2 weeks in 4 weeks.  Low-grade B-cell lymphoproliferative disorder.  Status post rituximab treatment for both ITP and low-grade B-cell lymphoproliferative disorder.  Platelet count has normalized 3 months ago.  Today low again. Continue monitor and watchful waiting. He is asymptomatic.  No constitutional symptoms Consider repeat rituximab +/- additional treatment if patient becomes symptomatic or platelet count drops below 30,000.  IgG Kappa MGUS, multiple myeloma panel and light chain ratios are pending. Follow Up Instructions: To be determined depending on his blood work.   I discussed the assessment and treatment plan with the patient. The patient was provided an opportunity to ask questions and all were answered. The patient agreed with the plan and demonstrated an understanding of the  instructions.  The patient was advised to call back or seek an in-person evaluation if the symptoms worsen or if the condition fails to improve as anticipated.    Earlie Server, MD 04/29/2021 9:49 PM

## 2021-04-29 NOTE — Progress Notes (Signed)
Patient denies new problems/concerns today.    Patient verified using two identifiers for virtual visit via telephone today.

## 2021-04-30 LAB — MULTIPLE MYELOMA PANEL, SERUM
Albumin SerPl Elph-Mcnc: 4.1 g/dL (ref 2.9–4.4)
Albumin/Glob SerPl: 1.6 (ref 0.7–1.7)
Alpha 1: 0.2 g/dL (ref 0.0–0.4)
Alpha2 Glob SerPl Elph-Mcnc: 0.6 g/dL (ref 0.4–1.0)
B-Globulin SerPl Elph-Mcnc: 0.8 g/dL (ref 0.7–1.3)
Gamma Glob SerPl Elph-Mcnc: 1.1 g/dL (ref 0.4–1.8)
Globulin, Total: 2.6 g/dL (ref 2.2–3.9)
IgA: 124 mg/dL (ref 90–386)
IgG (Immunoglobin G), Serum: 966 mg/dL (ref 603–1613)
IgM (Immunoglobulin M), Srm: 201 mg/dL — ABNORMAL HIGH (ref 20–172)
M Protein SerPl Elph-Mcnc: 0.2 g/dL — ABNORMAL HIGH
Total Protein ELP: 6.7 g/dL (ref 6.0–8.5)

## 2021-05-07 ENCOUNTER — Inpatient Hospital Stay: Payer: BC Managed Care – PPO

## 2021-05-07 DIAGNOSIS — D472 Monoclonal gammopathy: Secondary | ICD-10-CM | POA: Diagnosis not present

## 2021-05-07 LAB — CBC WITH DIFFERENTIAL/PLATELET
Abs Immature Granulocytes: 0.05 10*3/uL (ref 0.00–0.07)
Basophils Absolute: 0.1 10*3/uL (ref 0.0–0.1)
Basophils Relative: 1 %
Eosinophils Absolute: 0.1 10*3/uL (ref 0.0–0.5)
Eosinophils Relative: 1 %
HCT: 42.6 % (ref 39.0–52.0)
Hemoglobin: 14 g/dL (ref 13.0–17.0)
Immature Granulocytes: 1 %
Lymphocytes Relative: 18 %
Lymphs Abs: 1.7 10*3/uL (ref 0.7–4.0)
MCH: 27.5 pg (ref 26.0–34.0)
MCHC: 32.9 g/dL (ref 30.0–36.0)
MCV: 83.7 fL (ref 80.0–100.0)
Monocytes Absolute: 0.8 10*3/uL (ref 0.1–1.0)
Monocytes Relative: 9 %
Neutro Abs: 6.9 10*3/uL (ref 1.7–7.7)
Neutrophils Relative %: 70 %
Platelets: 59 10*3/uL — ABNORMAL LOW (ref 150–400)
RBC: 5.09 MIL/uL (ref 4.22–5.81)
RDW: 14 % (ref 11.5–15.5)
WBC: 9.7 10*3/uL (ref 4.0–10.5)
nRBC: 0 % (ref 0.0–0.2)

## 2021-05-07 LAB — IMMATURE PLATELET FRACTION: Immature Platelet Fraction: 22.5 % — ABNORMAL HIGH (ref 1.2–8.6)

## 2021-05-20 ENCOUNTER — Inpatient Hospital Stay: Payer: BC Managed Care – PPO | Attending: Oncology

## 2021-05-20 ENCOUNTER — Other Ambulatory Visit: Payer: Self-pay

## 2021-05-20 DIAGNOSIS — D696 Thrombocytopenia, unspecified: Secondary | ICD-10-CM | POA: Diagnosis not present

## 2021-05-20 DIAGNOSIS — D472 Monoclonal gammopathy: Secondary | ICD-10-CM

## 2021-05-20 LAB — CBC WITH DIFFERENTIAL/PLATELET
Abs Immature Granulocytes: 0.03 10*3/uL (ref 0.00–0.07)
Basophils Absolute: 0.1 10*3/uL (ref 0.0–0.1)
Basophils Relative: 1 %
Eosinophils Absolute: 0.1 10*3/uL (ref 0.0–0.5)
Eosinophils Relative: 2 %
HCT: 39.1 % (ref 39.0–52.0)
Hemoglobin: 13 g/dL (ref 13.0–17.0)
Immature Granulocytes: 0 %
Lymphocytes Relative: 23 %
Lymphs Abs: 1.6 10*3/uL (ref 0.7–4.0)
MCH: 28.1 pg (ref 26.0–34.0)
MCHC: 33.2 g/dL (ref 30.0–36.0)
MCV: 84.6 fL (ref 80.0–100.0)
Monocytes Absolute: 0.7 10*3/uL (ref 0.1–1.0)
Monocytes Relative: 10 %
Neutro Abs: 4.5 10*3/uL (ref 1.7–7.7)
Neutrophils Relative %: 64 %
Platelets: 110 10*3/uL — ABNORMAL LOW (ref 150–400)
RBC: 4.62 MIL/uL (ref 4.22–5.81)
RDW: 13.7 % (ref 11.5–15.5)
WBC: 7 10*3/uL (ref 4.0–10.5)
nRBC: 0 % (ref 0.0–0.2)

## 2021-05-20 LAB — IMMATURE PLATELET FRACTION: Immature Platelet Fraction: 9.2 % — ABNORMAL HIGH (ref 1.2–8.6)

## 2021-05-23 ENCOUNTER — Telehealth: Payer: Self-pay

## 2021-05-23 NOTE — Telephone Encounter (Signed)
-----   Message from Earlie Server, MD sent at 05/22/2021  9:09 PM EDT ----- Platelet count has improved. Recommend repeat cbc, immature platelet fraction in 2 months, and have virtual MD with me. Thanks.

## 2021-05-23 NOTE — Telephone Encounter (Signed)
Mychart message sent to inform pt of lab results and MD recommendation.

## 2021-07-22 ENCOUNTER — Other Ambulatory Visit: Payer: Self-pay

## 2021-07-22 DIAGNOSIS — D479 Neoplasm of uncertain behavior of lymphoid, hematopoietic and related tissue, unspecified: Secondary | ICD-10-CM

## 2021-07-22 DIAGNOSIS — D472 Monoclonal gammopathy: Secondary | ICD-10-CM

## 2021-07-24 ENCOUNTER — Inpatient Hospital Stay: Payer: BC Managed Care – PPO | Attending: Oncology

## 2021-07-24 DIAGNOSIS — D472 Monoclonal gammopathy: Secondary | ICD-10-CM

## 2021-07-24 DIAGNOSIS — D479 Neoplasm of uncertain behavior of lymphoid, hematopoietic and related tissue, unspecified: Secondary | ICD-10-CM

## 2021-07-24 DIAGNOSIS — D696 Thrombocytopenia, unspecified: Secondary | ICD-10-CM | POA: Diagnosis present

## 2021-07-24 LAB — CBC WITH DIFFERENTIAL/PLATELET
Abs Immature Granulocytes: 0.1 10*3/uL — ABNORMAL HIGH (ref 0.00–0.07)
Basophils Absolute: 0.1 10*3/uL (ref 0.0–0.1)
Basophils Relative: 1 %
Eosinophils Absolute: 0.1 10*3/uL (ref 0.0–0.5)
Eosinophils Relative: 2 %
HCT: 42.4 % (ref 39.0–52.0)
Hemoglobin: 14.1 g/dL (ref 13.0–17.0)
Immature Granulocytes: 2 %
Lymphocytes Relative: 35 %
Lymphs Abs: 2.3 10*3/uL (ref 0.7–4.0)
MCH: 28.1 pg (ref 26.0–34.0)
MCHC: 33.3 g/dL (ref 30.0–36.0)
MCV: 84.6 fL (ref 80.0–100.0)
Monocytes Absolute: 0.7 10*3/uL (ref 0.1–1.0)
Monocytes Relative: 11 %
Neutro Abs: 3.3 10*3/uL (ref 1.7–7.7)
Neutrophils Relative %: 49 %
Platelets: 48 10*3/uL — ABNORMAL LOW (ref 150–400)
RBC: 5.01 MIL/uL (ref 4.22–5.81)
RDW: 13.7 % (ref 11.5–15.5)
WBC: 6.7 10*3/uL (ref 4.0–10.5)
nRBC: 0 % (ref 0.0–0.2)

## 2021-07-24 LAB — IMMATURE PLATELET FRACTION: Immature Platelet Fraction: 23.2 % — ABNORMAL HIGH (ref 1.2–8.6)

## 2021-07-25 ENCOUNTER — Inpatient Hospital Stay (HOSPITAL_BASED_OUTPATIENT_CLINIC_OR_DEPARTMENT_OTHER): Payer: BC Managed Care – PPO | Admitting: Oncology

## 2021-07-25 ENCOUNTER — Encounter: Payer: Self-pay | Admitting: Oncology

## 2021-07-25 DIAGNOSIS — D472 Monoclonal gammopathy: Secondary | ICD-10-CM

## 2021-07-25 DIAGNOSIS — D479 Neoplasm of uncertain behavior of lymphoid, hematopoietic and related tissue, unspecified: Secondary | ICD-10-CM

## 2021-07-25 DIAGNOSIS — D696 Thrombocytopenia, unspecified: Secondary | ICD-10-CM | POA: Diagnosis not present

## 2021-07-25 NOTE — Progress Notes (Addendum)
HEMATOLOGY-ONCOLOGY TeleHEALTH VISIT PROGRESS NOTE  I connected with Steven Knight on 07/25/2021 @ at  3:30 PM EDT by video enabled telemedicine visit and verified that I am speaking with the correct person using two identifiers. I discussed the limitations, risks, security and privacy concerns of performing an evaluation and management service by telemedicine and the availability of in-person appointments. The patient expressed understanding and agreed to proceed.   Other persons participating in the visit and their role in the encounter:  Wife   Patient's location: Home  Provider's location: office Chief Complaint: Follow-up for thrombocytopenia   INTERVAL HISTORY Steven Knight is a 50 y.o. male who has above history reviewed by me today presents for follow up visit for management of thrombocytopenia, B-cell lymphoproliferative disorder Patient feels well. No new complaints.  Denies weight loss, fever, chills, fatigue, night sweats.  His appetite is good. No bleeding events.   Review of Systems  Unable to perform ROS: Other (Psychiatry disorder)  Constitutional:  Negative for fatigue.  Hematological:        No acute bleeding events   Past Medical History:  Diagnosis Date   Anxiety    Autism    Autism    Bipolar 1 disorder ([place])    Depression    Past Surgical History:  Procedure Laterality Date   EYE SURGERY      Family History  Problem Relation Age of Onset   Diabetes Mother    Dementia Paternal Grandfather     Social History   Socioeconomic History   Marital status: Married    Spouse name: Not on file   Number of children: Not on file   Years of education: Not on file   Highest education level: Not on file  Occupational History   Not on file  Tobacco Use   Smoking status: Never   Smokeless tobacco: Never  Vaping Use   Vaping Use: Never used  Substance and Sexual Activity   Alcohol use: Never   Drug use: Never   Sexual activity: Yes  Other  Topics Concern   Not on file  Social History Narrative   Not on file   Social Determinants of Health   Financial Resource Strain: Not on file  Food Insecurity: Not on file  Transportation Needs: Not on file  Physical Activity: Not on file  Stress: Not on file  Social Connections: Not on file  Intimate Partner Violence: Not on file    Current Outpatient Medications on File Prior to Visit  Medication Sig Dispense Refill   ARIPiprazole (ABILIFY) 20 MG tablet Take 1 tablet (20 mg total) by mouth daily. 30 tablet 1   LORazepam (ATIVAN) 0.5 MG tablet Take 1 tablet (0.5 mg total) by mouth every 8 (eight) hours as needed for anxiety. 90 tablet 1   mirtazapine (REMERON) 30 MG tablet Take 1 tablet (30 mg total) by mouth at bedtime. 90 tablet 1   propranolol (INDERAL) 10 MG tablet Take 1 tablet (10 mg total) by mouth 2 (two) times daily.     dexamethasone (DECADRON) 4 MG tablet Take 10 tablets (40 mg total) by mouth daily. (Patient not taking: No sig reported) 40 tablet 0   No current facility-administered medications on file prior to visit.    No Known Allergies     Observations/Objective: Today's Vitals   07/25/21 1401  PainSc: 0-No pain   There is no height or weight on file to calculate BMI.  Physical Exam Neurological:  Mental Status: He is alert.    CBC    Component Value Date/Time   WBC 6.7 07/24/2021 1113   RBC 5.01 07/24/2021 1113   HGB 14.1 07/24/2021 1113   HGB 14.1 09/24/2020 1420   HCT 42.4 07/24/2021 1113   HCT 41.3 09/24/2020 1420   PLT 48 (L) 07/24/2021 1113   PLT 14 (LL) 09/24/2020 1420   MCV 84.6 07/24/2021 1113   MCV 82 09/24/2020 1420   MCH 28.1 07/24/2021 1113   MCHC 33.3 07/24/2021 1113   RDW 13.7 07/24/2021 1113   RDW 12.7 09/24/2020 1420   LYMPHSABS 2.3 07/24/2021 1113   LYMPHSABS 2.9 09/24/2020 1420   MONOABS 0.7 07/24/2021 1113   EOSABS 0.1 07/24/2021 1113   EOSABS 0.1 09/24/2020 1420   BASOSABS 0.1 07/24/2021 1113   BASOSABS 0.1  09/24/2020 1420    CMP     Component Value Date/Time   NA 141 04/26/2021 1058   NA 144 09/24/2020 1420   K 4.2 04/26/2021 1058   CL 103 04/26/2021 1058   CO2 30 04/26/2021 1058   GLUCOSE 75 04/26/2021 1058   BUN 16 04/26/2021 1058   BUN 13 09/24/2020 1420   CREATININE 1.01 04/26/2021 1058   CALCIUM 8.9 04/26/2021 1058   PROT 6.8 04/26/2021 1058   PROT 7.0 09/24/2020 1420   ALBUMIN 4.1 04/26/2021 1058   ALBUMIN 4.5 09/24/2020 1420   AST 29 04/26/2021 1058   ALT 54 (H) 04/26/2021 1058   ALKPHOS 71 04/26/2021 1058   BILITOT 1.0 04/26/2021 1058   BILITOT 0.6 09/24/2020 1420   GFRNONAA >60 04/26/2021 1058   GFRAA 121 09/24/2020 1420     Assessment and Plan: 1. B-cell lymphoproliferative disorder ([place])   2. MGUS (monoclonal gammopathy of unknown significance)   3. Thrombocytopenia (HCC)     Thrombocytopenia Labs are reviewed and discussed with patient. Platelet count is 48,000. No bleeding Continue observation. Monitor cbc monthly.    Low-grade B-cell lymphoproliferative disorder.  Status post rituximab treatment for both ITP and low-grade B-cell lymphoproliferative disorder.   Continue monitor and watchful waiting. He is asymptomatic.  No constitutional symptoms Consider repeat rituximab +/- additional treatment if patient becomes symptomatic or platelet count drops below 30,000.  IgG Kappa MGUS, multiple myeloma panel and light chain ratios are stable.   Follow Up Instructions: Monthly CBC Lab md 3 months.    I discussed the assessment and treatment plan with the patient. The patient was provided an opportunity to ask questions and all were answered. The patient agreed with the plan and demonstrated an understanding of the instructions.  The patient was advised to call back or seek an in-person evaluation if the symptoms worsen or if the condition fails to improve as anticipated.    Earlie Server, MD 07/25/2021 11:04 PM

## 2021-07-27 ENCOUNTER — Encounter: Payer: Self-pay | Admitting: Oncology

## 2021-09-02 ENCOUNTER — Other Ambulatory Visit: Payer: Self-pay

## 2021-09-02 ENCOUNTER — Inpatient Hospital Stay: Payer: BC Managed Care – PPO

## 2021-09-02 ENCOUNTER — Inpatient Hospital Stay: Payer: BC Managed Care – PPO | Attending: Oncology

## 2021-09-02 DIAGNOSIS — D696 Thrombocytopenia, unspecified: Secondary | ICD-10-CM | POA: Diagnosis present

## 2021-09-02 DIAGNOSIS — D472 Monoclonal gammopathy: Secondary | ICD-10-CM

## 2021-09-02 LAB — CBC WITH DIFFERENTIAL/PLATELET
Abs Immature Granulocytes: 0.13 10*3/uL — ABNORMAL HIGH (ref 0.00–0.07)
Basophils Absolute: 0.1 10*3/uL (ref 0.0–0.1)
Basophils Relative: 1 %
Eosinophils Absolute: 0.1 10*3/uL (ref 0.0–0.5)
Eosinophils Relative: 2 %
HCT: 40.1 % (ref 39.0–52.0)
Hemoglobin: 13.6 g/dL (ref 13.0–17.0)
Immature Granulocytes: 2 %
Lymphocytes Relative: 31 %
Lymphs Abs: 2.4 10*3/uL (ref 0.7–4.0)
MCH: 28.6 pg (ref 26.0–34.0)
MCHC: 33.9 g/dL (ref 30.0–36.0)
MCV: 84.4 fL (ref 80.0–100.0)
Monocytes Absolute: 0.8 10*3/uL (ref 0.1–1.0)
Monocytes Relative: 11 %
Neutro Abs: 4.2 10*3/uL (ref 1.7–7.7)
Neutrophils Relative %: 53 %
Platelets: 50 10*3/uL — ABNORMAL LOW (ref 150–400)
RBC: 4.75 MIL/uL (ref 4.22–5.81)
RDW: 14 % (ref 11.5–15.5)
WBC: 7.8 10*3/uL (ref 4.0–10.5)
nRBC: 0 % (ref 0.0–0.2)

## 2021-09-02 LAB — TECHNOLOGIST SMEAR REVIEW
Plt Morphology: NORMAL
RBC MORPHOLOGY: NORMAL
WBC MORPHOLOGY: NORMAL

## 2021-10-02 ENCOUNTER — Inpatient Hospital Stay: Payer: BC Managed Care – PPO | Attending: Oncology

## 2021-10-02 ENCOUNTER — Encounter: Payer: Self-pay | Admitting: Oncology

## 2021-10-02 ENCOUNTER — Other Ambulatory Visit: Payer: Self-pay

## 2021-10-02 DIAGNOSIS — D696 Thrombocytopenia, unspecified: Secondary | ICD-10-CM | POA: Diagnosis present

## 2021-10-02 DIAGNOSIS — D472 Monoclonal gammopathy: Secondary | ICD-10-CM

## 2021-10-02 LAB — CBC WITH DIFFERENTIAL/PLATELET
Abs Immature Granulocytes: 0.1 10*3/uL — ABNORMAL HIGH (ref 0.00–0.07)
Basophils Absolute: 0.1 10*3/uL (ref 0.0–0.1)
Basophils Relative: 1 %
Eosinophils Absolute: 0.1 10*3/uL (ref 0.0–0.5)
Eosinophils Relative: 1 %
HCT: 41.2 % (ref 39.0–52.0)
Hemoglobin: 14.3 g/dL (ref 13.0–17.0)
Immature Granulocytes: 1 %
Lymphocytes Relative: 31 %
Lymphs Abs: 2.3 10*3/uL (ref 0.7–4.0)
MCH: 29.6 pg (ref 26.0–34.0)
MCHC: 34.7 g/dL (ref 30.0–36.0)
MCV: 85.3 fL (ref 80.0–100.0)
Monocytes Absolute: 0.8 10*3/uL (ref 0.1–1.0)
Monocytes Relative: 11 %
Neutro Abs: 4 10*3/uL (ref 1.7–7.7)
Neutrophils Relative %: 55 %
Platelets: 53 10*3/uL — ABNORMAL LOW (ref 150–400)
RBC: 4.83 MIL/uL (ref 4.22–5.81)
RDW: 13.2 % (ref 11.5–15.5)
WBC: 7.4 10*3/uL (ref 4.0–10.5)
nRBC: 0 % (ref 0.0–0.2)

## 2021-10-24 ENCOUNTER — Encounter: Payer: Self-pay | Admitting: Oncology

## 2021-10-30 ENCOUNTER — Inpatient Hospital Stay: Payer: BC Managed Care – PPO | Attending: Oncology

## 2021-10-30 ENCOUNTER — Other Ambulatory Visit: Payer: Self-pay

## 2021-10-30 DIAGNOSIS — D479 Neoplasm of uncertain behavior of lymphoid, hematopoietic and related tissue, unspecified: Secondary | ICD-10-CM | POA: Insufficient documentation

## 2021-10-30 DIAGNOSIS — D693 Immune thrombocytopenic purpura: Secondary | ICD-10-CM | POA: Insufficient documentation

## 2021-10-30 DIAGNOSIS — D472 Monoclonal gammopathy: Secondary | ICD-10-CM | POA: Insufficient documentation

## 2021-10-30 DIAGNOSIS — Z79899 Other long term (current) drug therapy: Secondary | ICD-10-CM | POA: Insufficient documentation

## 2021-10-30 DIAGNOSIS — F319 Bipolar disorder, unspecified: Secondary | ICD-10-CM | POA: Diagnosis not present

## 2021-10-30 DIAGNOSIS — D696 Thrombocytopenia, unspecified: Secondary | ICD-10-CM | POA: Insufficient documentation

## 2021-10-30 DIAGNOSIS — F840 Autistic disorder: Secondary | ICD-10-CM | POA: Diagnosis not present

## 2021-10-30 LAB — CBC WITH DIFFERENTIAL/PLATELET
Abs Immature Granulocytes: 0.09 10*3/uL — ABNORMAL HIGH (ref 0.00–0.07)
Basophils Absolute: 0.1 10*3/uL (ref 0.0–0.1)
Basophils Relative: 1 %
Eosinophils Absolute: 0.1 10*3/uL (ref 0.0–0.5)
Eosinophils Relative: 1 %
HCT: 41.3 % (ref 39.0–52.0)
Hemoglobin: 14 g/dL (ref 13.0–17.0)
Immature Granulocytes: 1 %
Lymphocytes Relative: 28 %
Lymphs Abs: 1.9 10*3/uL (ref 0.7–4.0)
MCH: 28.9 pg (ref 26.0–34.0)
MCHC: 33.9 g/dL (ref 30.0–36.0)
MCV: 85.3 fL (ref 80.0–100.0)
Monocytes Absolute: 0.6 10*3/uL (ref 0.1–1.0)
Monocytes Relative: 9 %
Neutro Abs: 4 10*3/uL (ref 1.7–7.7)
Neutrophils Relative %: 60 %
Platelets: 51 10*3/uL — ABNORMAL LOW (ref 150–400)
RBC: 4.84 MIL/uL (ref 4.22–5.81)
RDW: 12.9 % (ref 11.5–15.5)
WBC: 6.7 10*3/uL (ref 4.0–10.5)
nRBC: 0 % (ref 0.0–0.2)

## 2021-10-30 LAB — COMPREHENSIVE METABOLIC PANEL
ALT: 33 U/L (ref 0–44)
AST: 20 U/L (ref 15–41)
Albumin: 4.7 g/dL (ref 3.5–5.0)
Alkaline Phosphatase: 55 U/L (ref 38–126)
Anion gap: 7 (ref 5–15)
BUN: 23 mg/dL — ABNORMAL HIGH (ref 6–20)
CO2: 29 mmol/L (ref 22–32)
Calcium: 9.1 mg/dL (ref 8.9–10.3)
Chloride: 103 mmol/L (ref 98–111)
Creatinine, Ser: 0.88 mg/dL (ref 0.61–1.24)
GFR, Estimated: >60 mL/min (ref >60)
Glucose, Bld: 94 mg/dL (ref 70–99)
Potassium: 4 mmol/L (ref 3.5–5.1)
Sodium: 139 mmol/L (ref 135–145)
Total Bilirubin: 1 mg/dL (ref 0.3–1.2)
Total Protein: 7.2 g/dL (ref 6.5–8.1)

## 2021-10-30 LAB — FOLATE: Folate: 10.9 ng/mL (ref >5.9)

## 2021-10-30 LAB — TECHNOLOGIST SMEAR REVIEW: Plt Morphology: DECREASED

## 2021-10-30 LAB — VITAMIN B12: Vitamin B-12: 343 pg/mL (ref 180–914)

## 2021-10-30 LAB — IMMATURE PLATELET FRACTION: Immature Platelet Fraction: 18.5 % — ABNORMAL HIGH (ref 1.2–8.6)

## 2021-11-04 ENCOUNTER — Inpatient Hospital Stay: Payer: BC Managed Care – PPO

## 2021-11-04 NOTE — Progress Notes (Signed)
HEMATOLOGY-ONCOLOGY  PROGRESS NOTE   Chief Complaint: Follow-up for thrombocytopenia  HISTORY OF presenting illness: Patient is 50 year old male with past medical history of cognitive delays, no depression, bipolar 1, autism, anxiety, who was referred for evaluation of thrombocytopenia.  Lab work on 07/22/2018 revealed thrombocytopenia, with platelet counts 81.  WBC 10.2, hemoglobin 14.9, MCV 86, neutrophil absolute 7.3 [normal ref 1.4-7.0] Creatinine 1.13, sodium 144, potassium 4.3, calcium 9.6, albumin 4.5, bilirubin 0.3, alkaline phosphatase 46, AST ALT normal.   Per patient's wife, patient's thrombocytopenia has been chronic.  They were told that patient had thrombocytopenia for about 1 to 2 years.  No bleeding events. No aggravating or improving factors.   Associated symptoms: Patient denies fatigue, weight loss, easy bruising, hema tochezia, hemoptysis.  History hepatitis or HIV infection.  Denies History of chronic liver disease denies Alcohol consumption denies   Patient has autism and bipolar disease.  Poor historian Patientis now on Abilify . for about a year.  Off Depakote.   History is obtained from wife.   # had a course of 4 days of dexamethasone and the platelet count normalized to 160.000 However dropped back to 47,000. Appetite is fair.   INTERVAL HISTORY RIYANSH GERSTNER is a 50 y.o. male with above history of thrombocytopenia, B-cell lymphoproliferative disorder, and MGUS status post weekly treatments of rituximab x4 and Decadron.  Currently on surveillance.  Patient is poor historian d/t hx of Asperger and history and review of systems is provided by his wife who accompanies him today.  No fevers, chills, fatigue, night sweats.  Appetite is stable and no bleeding events.   Review of Systems  Unable to perform ROS: Psychiatric disorder  Constitutional:  Negative for appetite change, chills, fatigue and fever.  Hematological:  Does not bruise/bleed easily.    Past Medical History:  Diagnosis Date   Anxiety    Autism    Autism    Bipolar 1 disorder ([place])    Depression    Past Surgical History:  Procedure Laterality Date   EYE SURGERY      Family History  Problem Relation Age of Onset   Diabetes Mother    Dementia Paternal Grandfather     Social History   Socioeconomic History   Marital status: Married    Spouse name: Not on file   Number of children: Not on file   Years of education: Not on file   Highest education level: Not on file  Occupational History   Not on file  Tobacco Use   Smoking status: Never   Smokeless tobacco: Never  Vaping Use   Vaping Use: Never used  Substance and Sexual Activity   Alcohol use: Never   Drug use: Never   Sexual activity: Yes  Other Topics Concern   Not on file  Social History Narrative   Not on file   Social Determinants of Health   Financial Resource Strain: Not on file  Food Insecurity: Not on file  Transportation Needs: Not on file  Physical Activity: Not on file  Stress: Not on file  Social Connections: Not on file  Intimate Partner Violence: Not on file    Current Outpatient Medications on File Prior to Visit  Medication Sig Dispense Refill   ARIPiprazole (ABILIFY) 20 MG tablet Take 1 tablet (20 mg total) by mouth daily. 30 tablet 1   dexamethasone (DECADRON) 4 MG tablet Take 10 tablets (40 mg total) by mouth daily. (Patient not taking: No sig reported) 40 tablet 0  LORazepam (ATIVAN) 0.5 MG tablet Take 1 tablet (0.5 mg total) by mouth every 8 (eight) hours as needed for anxiety. 90 tablet 1   mirtazapine (REMERON) 30 MG tablet Take 1 tablet (30 mg total) by mouth at bedtime. 90 tablet 1   propranolol (INDERAL) 10 MG tablet Take 1 tablet (10 mg total) by mouth 2 (two) times daily.     No current facility-administered medications on file prior to visit.    No Known Allergies     Observations/Objective: Today's Vitals   11/05/21 1042 11/05/21 1044  BP:  103/74   Pulse:  88  Resp:  16  Temp:  98.3 F (36.8 C)  TempSrc:  Tympanic  SpO2:  98%  Weight:  167 lb (75.8 kg)  PainSc: 0-No pain 0-No pain   Body mass index is 22.65 kg/m.  Physical Exam Neurological:     Mental Status: He is alert.    CBC    Component Value Date/Time   WBC 6.7 10/30/2021 0951   RBC 4.84 10/30/2021 0951   HGB 14.0 10/30/2021 0951   HGB 14.1 09/24/2020 1420   HCT 41.3 10/30/2021 0951   HCT 41.3 09/24/2020 1420   PLT 51 (L) 10/30/2021 0951   PLT 14 (LL) 09/24/2020 1420   MCV 85.3 10/30/2021 0951   MCV 82 09/24/2020 1420   MCH 28.9 10/30/2021 0951   MCHC 33.9 10/30/2021 0951   RDW 12.9 10/30/2021 0951   RDW 12.7 09/24/2020 1420   LYMPHSABS 1.9 10/30/2021 0951   LYMPHSABS 2.9 09/24/2020 1420   MONOABS 0.6 10/30/2021 0951   EOSABS 0.1 10/30/2021 0951   EOSABS 0.1 09/24/2020 1420   BASOSABS 0.1 10/30/2021 0951   BASOSABS 0.1 09/24/2020 1420    CMP     Component Value Date/Time   NA 139 10/30/2021 0951   NA 144 09/24/2020 1420   K 4.0 10/30/2021 0951   CL 103 10/30/2021 0951   CO2 29 10/30/2021 0951   GLUCOSE 94 10/30/2021 0951   BUN 23 (H) 10/30/2021 0951   BUN 13 09/24/2020 1420   CREATININE 0.88 10/30/2021 0951   CALCIUM 9.1 10/30/2021 0951   PROT 7.2 10/30/2021 0951   PROT 7.0 09/24/2020 1420   ALBUMIN 4.7 10/30/2021 0951   ALBUMIN 4.5 09/24/2020 1420   AST 20 10/30/2021 0951   ALT 33 10/30/2021 0951   ALKPHOS 55 10/30/2021 0951   BILITOT 1.0 10/30/2021 0951   BILITOT 0.6 09/24/2020 1420   GFRNONAA >60 10/30/2021 0951   GFRAA 121 09/24/2020 1420     Assessment and Plan: 1. MGUS (monoclonal gammopathy of unknown significance)   2. Thrombocytopenia ([place])   3. B-cell lymphoproliferative disorder ([place])     Thrombocytopenia-labs reviewed and discussed with patient and his wife.  Platelet count is stable at 51,000.  Continue observation.   Low-grade B-cell lymphoproliferative disorder-status post rituximab treatment for both ITP and  low-grade B-cell lymphoproliferative disorder.  Clinically he is asymptomatic.  Continue monitoring and watchful waiting.  Reviewed that we would consider repeating rituximab +/- additional treatment if patient becomes symptomatic or if platelet counts drop below 30,000.  IgG kappa MGUS-multiple myeloma panel and kappa lambda light chains from May 2022 were stable.  No new crab criteria.  Monitor.  Plan to check multiple myeloma panel and light chains at next visit.  RTC:  3 months for labs (cbc) 6 months- labs (cbc, cmp, multiple myeloma panel, kappa lambda light chains, immature platelet fracture, tech smear), Dr. Tasia Catchings  I discussed the  assessment and treatment plan with the patient. The patient was provided an opportunity to ask questions and all were answered. The patient agreed with the plan and demonstrated an understanding of the instructions.   The patient was advised to call back or seek an in-person evaluation if the symptoms worsen or if the condition fails to improve as anticipated.   Verlon Au, NP 11/04/2021

## 2021-11-05 ENCOUNTER — Other Ambulatory Visit: Payer: Self-pay

## 2021-11-05 ENCOUNTER — Inpatient Hospital Stay (HOSPITAL_BASED_OUTPATIENT_CLINIC_OR_DEPARTMENT_OTHER): Payer: BC Managed Care – PPO | Admitting: Nurse Practitioner

## 2021-11-05 ENCOUNTER — Encounter: Payer: Self-pay | Admitting: Nurse Practitioner

## 2021-11-05 ENCOUNTER — Inpatient Hospital Stay: Payer: BC Managed Care – PPO | Admitting: Oncology

## 2021-11-05 VITALS — BP 103/74 | HR 88 | Temp 98.3°F | Resp 16 | Wt 167.0 lb

## 2021-11-05 DIAGNOSIS — D696 Thrombocytopenia, unspecified: Secondary | ICD-10-CM

## 2021-11-05 DIAGNOSIS — D479 Neoplasm of uncertain behavior of lymphoid, hematopoietic and related tissue, unspecified: Secondary | ICD-10-CM

## 2021-11-05 DIAGNOSIS — D472 Monoclonal gammopathy: Secondary | ICD-10-CM

## 2021-11-05 DIAGNOSIS — D693 Immune thrombocytopenic purpura: Secondary | ICD-10-CM | POA: Diagnosis not present

## 2021-11-05 NOTE — Progress Notes (Signed)
Pt and caregiver in for follow up, denies any concerns today.

## 2021-11-06 ENCOUNTER — Encounter: Payer: Self-pay | Admitting: Oncology

## 2021-12-05 IMAGING — MR MR ABDOMEN WO/W CM
18 of 19 series · 44 of 48 positions shown · IV contrast (GADAVIST)
Comparison: Ultrasound exam 09/28/2020

CLINICAL DATA: Renal mass seen on ultrasound.

EXAM:
MRI ABDOMEN WITHOUT AND WITH CONTRAST
TECHNIQUE: Multiplanar multisequence MR imaging of the abdomen was performed
both before and after the administration of intravenous contrast.
CONTRAST:  7mL GADAVIST GADOBUTROL 1 MMOL/ML IV SOLN

[Series 3: cor ssfse / · coronal · 7.0mm · 1.48mm/px · 2 of 30 slices shown]
[im 1/30]
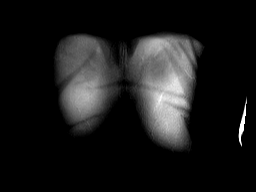
[im 30/30]
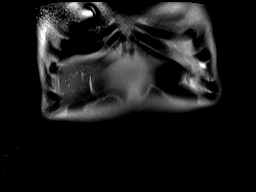

[Series 4: T2 fat-sat · axial · 6.0mm · 1.48mm/px · z∈[-187,+22]mm · 2 of 30 slices shown]
[im 1/30]
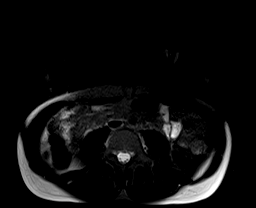
[im 30/30]
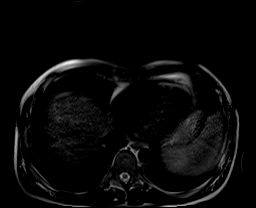

[Series 5: T1 · axial · 6.0mm · 0.74mm/px · z∈[-187,+22]mm · 4 of 60 slices shown]
[im 1/60]
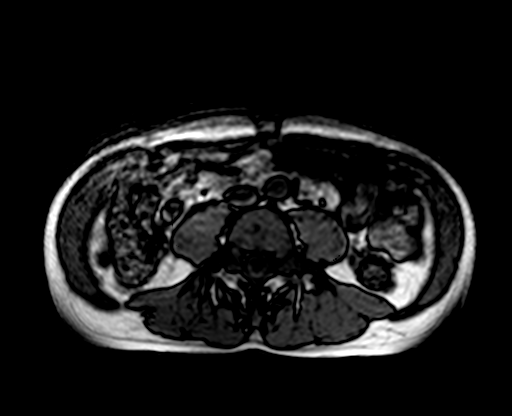
[im 20/60]
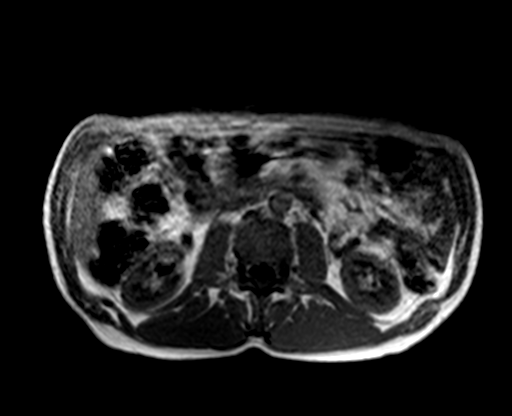
[im 40/60]
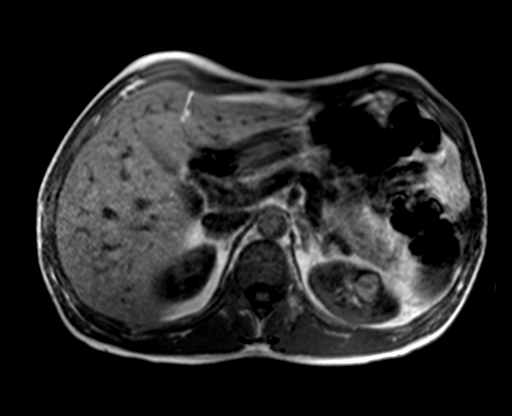
[im 60/60]
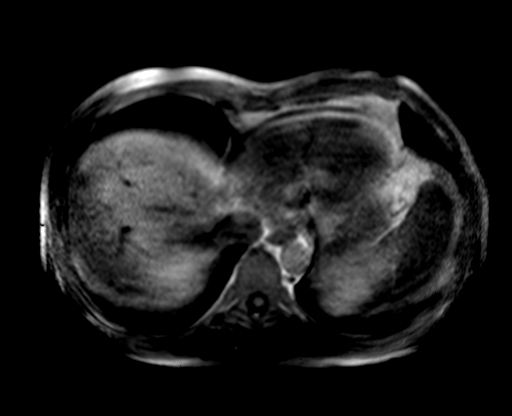

[Series 7: DWI · axial · 6.0mm · 2.00mm/px · z∈[-187,+22]mm · 4 of 90 slices shown (1 of 2)]
[im 1/90]
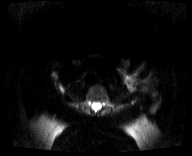
[im 30/90]
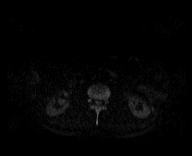
[im 60/90]
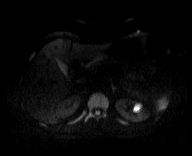
[im 90/90]
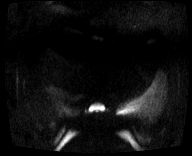

[Series 8: DWI · axial · 6.0mm · 2.00mm/px · 1 of 30 slices shown (2 of 2)]
[im 1/30]
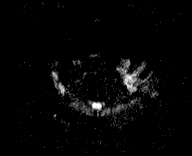

[Series 9: bSSFP · axial · 6.0mm · 0.74mm/px · 1 of 30 slices shown]
[im 1/30]
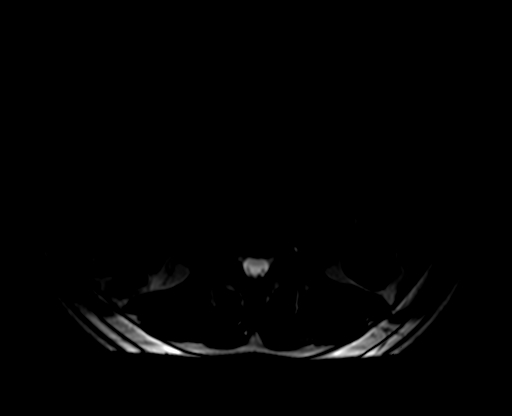

[Series 10: axial ssfse / · axial · 6.0mm · 1.19mm/px · 1 of 30 slices shown]
[im 1/30]
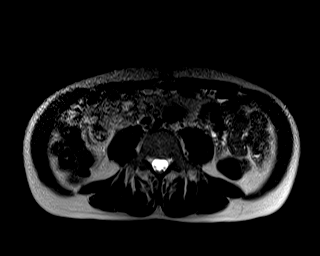

[Series 11: axial dynamic pre · axial · non-contrast · 4.0mm · 1.25mm/px · z∈[-209,+43]mm · 3 of 64 slices shown]
[im 1/64]
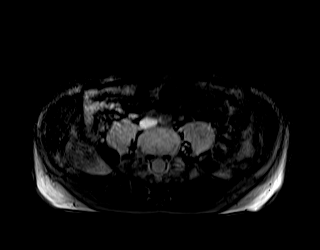
[im 32/64]
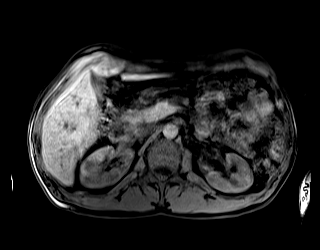
[im 64/64]
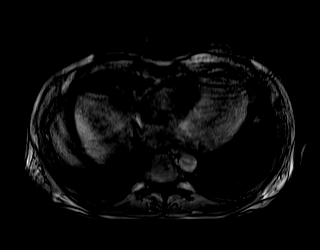

[Series 12: axial dynamic post · axial · 4.0mm · 1.25mm/px · z∈[-209,+43]mm · 3 of 64 slices shown (1 of 6)]
[im 1/64]
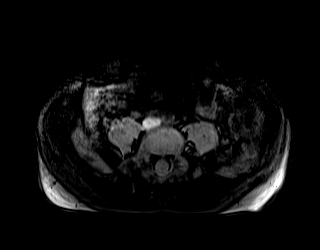
[im 32/64]
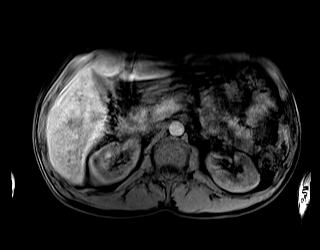
[im 64/64]
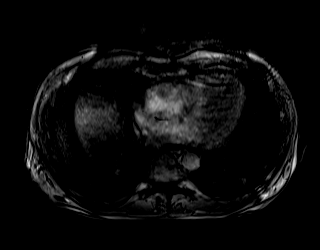

[Series 13: axial dynamic post · axial · 4.0mm · 1.25mm/px · z∈[-209,+43]mm · 3 of 64 slices shown (2 of 6)]
[im 1/64]
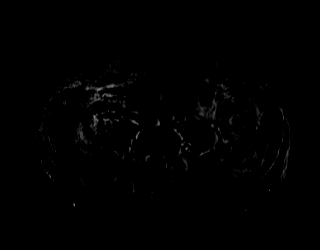
[im 32/64]
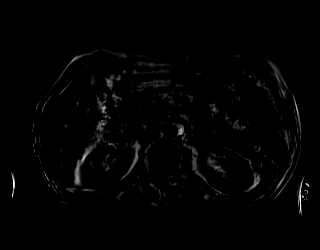
[im 64/64]
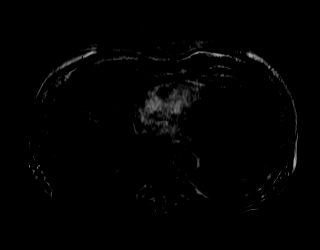

[Series 14: axial dynamic post · axial · 4.0mm · 1.25mm/px · z∈[-209,+43]mm · 3 of 64 slices shown (3 of 6)]
[im 1/64]
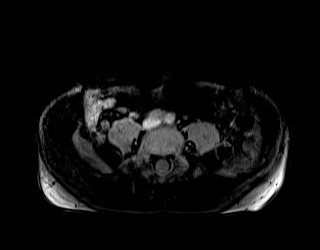
[im 32/64]
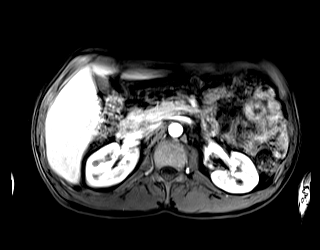
[im 64/64]
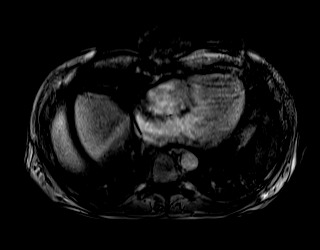

[Series 15: axial dynamic post · axial · 4.0mm · 1.25mm/px · z∈[-209,+43]mm · 3 of 64 slices shown (4 of 6)]
[im 1/64]
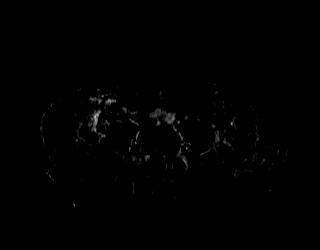
[im 32/64]
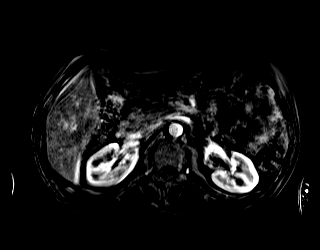
[im 64/64]
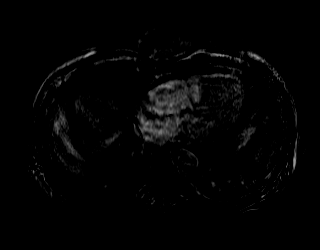

[Series 16: axial dynamic post · axial · 4.0mm · 1.25mm/px · z∈[-209,+43]mm · 3 of 64 slices shown (5 of 6)]
[im 1/64]
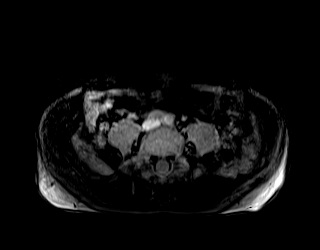
[im 32/64]
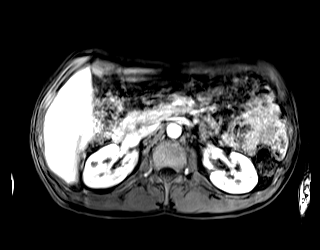
[im 64/64]
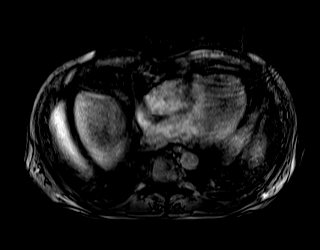

[Series 17: axial dynamic post · axial · 4.0mm · 1.25mm/px · z∈[-209,+43]mm · 3 of 64 slices shown (6 of 6)]
[im 1/64]
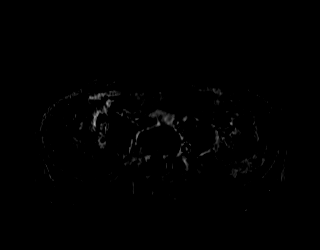
[im 32/64]
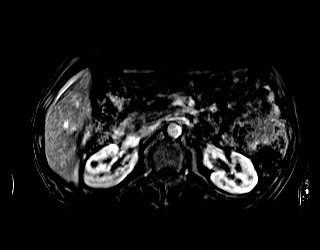
[im 64/64]
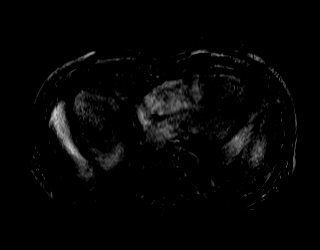

[Series 19: axial dynamic 3 · axial · 4.0mm · 1.25mm/px · z∈[-209,+43]mm · 3 of 64 slices shown (1 of 2)]
[im 1/64]
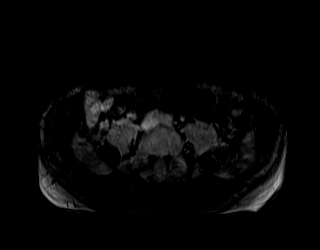
[im 32/64]
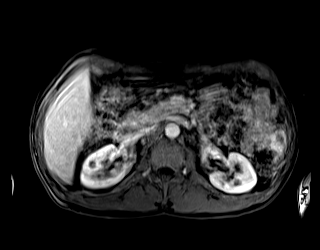
[im 64/64]
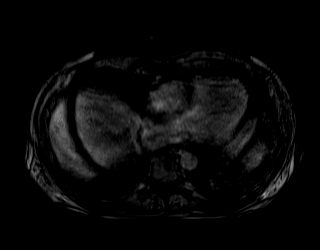

[Series 20: axial dynamic 3 · axial · 4.0mm · 1.25mm/px · z∈[-209,+43]mm · 3 of 64 slices shown (2 of 2)]
[im 1/64]
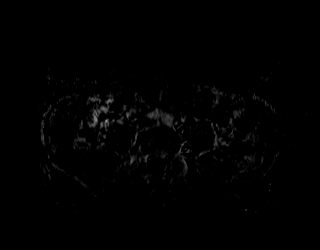
[im 32/64]
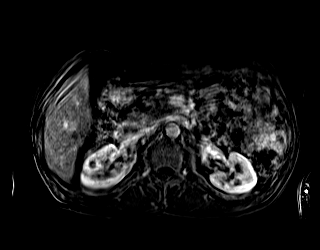
[im 64/64]
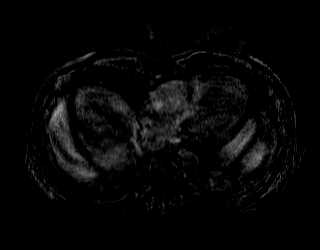

[Series 100: out of phase · axial · 6.0mm · 0.74mm/px · 1 of 30 slices shown]
[im 1/30]
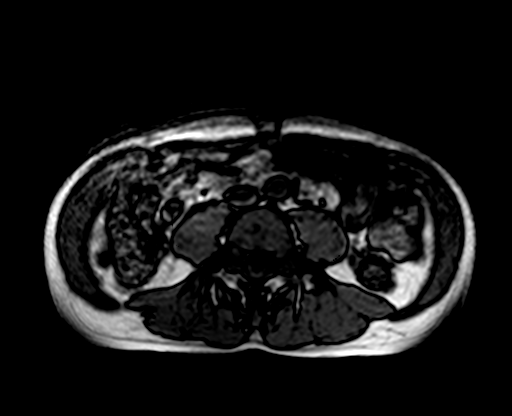

[Series 101: in phase · axial · 6.0mm · 0.74mm/px · 1 of 30 slices shown]
[im 1/30]
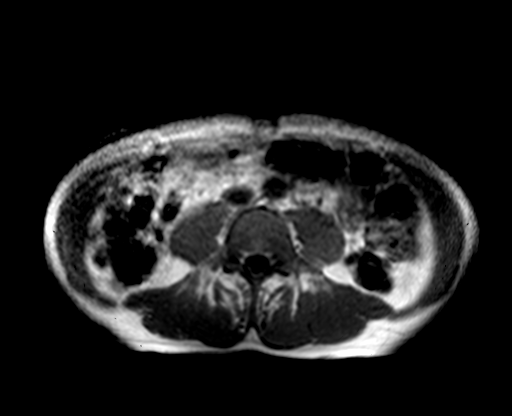

[44 of 48 positions shown; findings below may reference images not displayed]

FINDINGS: Markedly motion degraded study.

Lower chest: Unremarkable.

Hepatobiliary: 13 mm T2 hyperintense lesion in the anterior liver,
along the gallbladder fossa is largely obscured on precontrast T1
imaging and is not visible on postcontrast T1 imaging. There is no
evidence for gallstones, gallbladder wall thickening, or
pericholecystic fluid. No intrahepatic or extrahepatic biliary
dilation.

Pancreas: Motion degraded assessment shows no focal mass lesion. No
dilatation of the main duct.

Spleen:  No splenomegaly. No focal mass lesion.

Adrenals/Urinary Tract: No adrenal nodule or mass. Tiny T2
hyperintensities in both kidneys are likely cysts but are not
definitively characterized given motion artifact on postcontrast
imaging. Exophytic 2.3 cm heterogeneous lesion upper pole left
kidney shows a circumferential hypointense rim with heterogeneous
increased signal on T1 and T2 weighted imaging. Assessment on
postcontrast imaging is degraded due to motion artifact.

Stomach/Bowel: Stomach is unremarkable. No gastric wall thickening.
No evidence of outlet obstruction. No small bowel or colonic
dilatation within the visualized abdomen.

Vascular/Lymphatic:  No abdominal aortic aneurysm.

Other:  No intraperitoneal free fluid.

Musculoskeletal: No focal suspicious marrow enhancement within the
visualized bony anatomy.
IMPRESSION: 1. Exam image quality markedly degraded by patient difficulty with
reproducible breath holding.
2. Dominant finding in the upper pole left kidney likely hemorrhagic
cyst although not definitively characterized on today's study due to
motion artifact. Follow-up CT abdomen with and without contrast
recommended to further evaluate given the better temporal resolution
of CT imaging.
3. Numerous tiny T2 hyperintensities in each kidney cannot be
definitively characterized but are likely benign. These could also
be reassessed at the time of follow-up CT.
4. 13 mm lesion in the anterior right liver along the gallbladder
fossa, potentially hemangioma but incompletely characterized.
Attention on follow-up CT recommended.

## 2022-01-28 ENCOUNTER — Other Ambulatory Visit: Payer: Self-pay | Admitting: *Deleted

## 2022-01-28 ENCOUNTER — Encounter: Payer: Self-pay | Admitting: Oncology

## 2022-01-28 DIAGNOSIS — D472 Monoclonal gammopathy: Secondary | ICD-10-CM

## 2022-01-28 DIAGNOSIS — D696 Thrombocytopenia, unspecified: Secondary | ICD-10-CM

## 2022-01-28 DIAGNOSIS — D479 Neoplasm of uncertain behavior of lymphoid, hematopoietic and related tissue, unspecified: Secondary | ICD-10-CM

## 2022-02-04 ENCOUNTER — Inpatient Hospital Stay: Payer: Medicare HMO | Attending: Oncology

## 2022-02-04 ENCOUNTER — Other Ambulatory Visit: Payer: Self-pay

## 2022-02-04 DIAGNOSIS — D696 Thrombocytopenia, unspecified: Secondary | ICD-10-CM | POA: Diagnosis not present

## 2022-02-04 DIAGNOSIS — D472 Monoclonal gammopathy: Secondary | ICD-10-CM | POA: Diagnosis not present

## 2022-02-04 DIAGNOSIS — D479 Neoplasm of uncertain behavior of lymphoid, hematopoietic and related tissue, unspecified: Secondary | ICD-10-CM

## 2022-02-04 LAB — CBC WITH DIFFERENTIAL/PLATELET
Abs Immature Granulocytes: 0.07 10*3/uL (ref 0.00–0.07)
Basophils Absolute: 0.1 10*3/uL (ref 0.0–0.1)
Basophils Relative: 1 %
Eosinophils Absolute: 0.1 10*3/uL (ref 0.0–0.5)
Eosinophils Relative: 1 %
HCT: 41 % (ref 39.0–52.0)
Hemoglobin: 13.9 g/dL (ref 13.0–17.0)
Immature Granulocytes: 1 %
Lymphocytes Relative: 34 %
Lymphs Abs: 2.3 10*3/uL (ref 0.7–4.0)
MCH: 29.2 pg (ref 26.0–34.0)
MCHC: 33.9 g/dL (ref 30.0–36.0)
MCV: 86.1 fL (ref 80.0–100.0)
Monocytes Absolute: 0.6 10*3/uL (ref 0.1–1.0)
Monocytes Relative: 9 %
Neutro Abs: 3.6 10*3/uL (ref 1.7–7.7)
Neutrophils Relative %: 54 %
Platelets: 56 10*3/uL — ABNORMAL LOW (ref 150–400)
RBC: 4.76 MIL/uL (ref 4.22–5.81)
RDW: 12.8 % (ref 11.5–15.5)
WBC: 6.7 10*3/uL (ref 4.0–10.5)
nRBC: 0 % (ref 0.0–0.2)

## 2022-05-02 ENCOUNTER — Other Ambulatory Visit: Payer: BC Managed Care – PPO

## 2022-05-06 ENCOUNTER — Telehealth: Payer: BC Managed Care – PPO | Admitting: Oncology

## 2022-05-06 ENCOUNTER — Inpatient Hospital Stay: Payer: Medicare HMO | Attending: Oncology

## 2022-05-06 DIAGNOSIS — Z79899 Other long term (current) drug therapy: Secondary | ICD-10-CM | POA: Diagnosis not present

## 2022-05-06 DIAGNOSIS — D696 Thrombocytopenia, unspecified: Secondary | ICD-10-CM | POA: Insufficient documentation

## 2022-05-06 DIAGNOSIS — D472 Monoclonal gammopathy: Secondary | ICD-10-CM | POA: Insufficient documentation

## 2022-05-06 DIAGNOSIS — D479 Neoplasm of uncertain behavior of lymphoid, hematopoietic and related tissue, unspecified: Secondary | ICD-10-CM | POA: Diagnosis not present

## 2022-05-06 LAB — COMPREHENSIVE METABOLIC PANEL
ALT: 23 U/L (ref 0–44)
AST: 16 U/L (ref 15–41)
Albumin: 4.3 g/dL (ref 3.5–5.0)
Alkaline Phosphatase: 48 U/L (ref 38–126)
Anion gap: 5 (ref 5–15)
BUN: 21 mg/dL — ABNORMAL HIGH (ref 6–20)
CO2: 30 mmol/L (ref 22–32)
Calcium: 8.6 mg/dL — ABNORMAL LOW (ref 8.9–10.3)
Chloride: 105 mmol/L (ref 98–111)
Creatinine, Ser: 0.96 mg/dL (ref 0.61–1.24)
GFR, Estimated: >60 mL/min (ref >60)
Glucose, Bld: 86 mg/dL (ref 70–99)
Potassium: 4.4 mmol/L (ref 3.5–5.1)
Sodium: 140 mmol/L (ref 135–145)
Total Bilirubin: 1.1 mg/dL (ref 0.3–1.2)
Total Protein: 7.1 g/dL (ref 6.5–8.1)

## 2022-05-06 LAB — CBC WITH DIFFERENTIAL/PLATELET
Abs Immature Granulocytes: 0.24 10*3/uL — ABNORMAL HIGH (ref 0.00–0.07)
Basophils Absolute: 0.1 10*3/uL (ref 0.0–0.1)
Basophils Relative: 2 %
Eosinophils Absolute: 0.1 10*3/uL (ref 0.0–0.5)
Eosinophils Relative: 1 %
HCT: 41 % (ref 39.0–52.0)
Hemoglobin: 13.9 g/dL (ref 13.0–17.0)
Immature Granulocytes: 4 %
Lymphocytes Relative: 37 %
Lymphs Abs: 2.5 10*3/uL (ref 0.7–4.0)
MCH: 29 pg (ref 26.0–34.0)
MCHC: 33.9 g/dL (ref 30.0–36.0)
MCV: 85.4 fL (ref 80.0–100.0)
Monocytes Absolute: 0.7 10*3/uL (ref 0.1–1.0)
Monocytes Relative: 10 %
Neutro Abs: 3.2 10*3/uL (ref 1.7–7.7)
Neutrophils Relative %: 46 %
Platelets: 54 10*3/uL — ABNORMAL LOW (ref 150–400)
RBC: 4.8 MIL/uL (ref 4.22–5.81)
RDW: 13.3 % (ref 11.5–15.5)
WBC: 6.9 10*3/uL (ref 4.0–10.5)
nRBC: 0 % (ref 0.0–0.2)

## 2022-05-06 LAB — TECHNOLOGIST SMEAR REVIEW: Plt Morphology: DECREASED

## 2022-05-06 LAB — IMMATURE PLATELET FRACTION: Immature Platelet Fraction: 19.7 % — ABNORMAL HIGH (ref 1.2–8.6)

## 2022-05-07 ENCOUNTER — Inpatient Hospital Stay (HOSPITAL_BASED_OUTPATIENT_CLINIC_OR_DEPARTMENT_OTHER): Payer: Medicare HMO | Admitting: Oncology

## 2022-05-07 ENCOUNTER — Encounter: Payer: Self-pay | Admitting: Oncology

## 2022-05-07 DIAGNOSIS — D696 Thrombocytopenia, unspecified: Secondary | ICD-10-CM | POA: Diagnosis not present

## 2022-05-07 DIAGNOSIS — D479 Neoplasm of uncertain behavior of lymphoid, hematopoietic and related tissue, unspecified: Secondary | ICD-10-CM

## 2022-05-07 DIAGNOSIS — D472 Monoclonal gammopathy: Secondary | ICD-10-CM

## 2022-05-07 LAB — KAPPA/LAMBDA LIGHT CHAINS
Kappa free light chain: 41.8 mg/L — ABNORMAL HIGH (ref 3.3–19.4)
Kappa, lambda light chain ratio: 1.97 — ABNORMAL HIGH (ref 0.26–1.65)
Lambda free light chains: 21.2 mg/L (ref 5.7–26.3)

## 2022-05-08 ENCOUNTER — Encounter: Payer: Self-pay | Admitting: Oncology

## 2022-05-08 NOTE — Progress Notes (Signed)
HEMATOLOGY-ONCOLOGY TeleHEALTH VISIT PROGRESS NOTE  I connected with Steven Knight on 05/07/22 @ at  2:45 PM EDT by video enabled telemedicine visit and verified that I am speaking with the correct person using two identifiers. I discussed the limitations, risks, security and privacy concerns of performing an evaluation and management service by telemedicine and the availability of in-person appointments. The patient expressed understanding and agreed to proceed.   Other persons participating in the visit and their role in the encounter:  Wife   Patient's location: Home  Provider's location: office Chief Complaint: Follow-up for thrombocytopenia   INTERVAL HISTORY Steven Knight is a 51 y.o. male who has above history reviewed by me today presents for follow up visit for management of thrombocytopenia, B-cell lymphoproliferative disorder Patient reports feeling well.  Denies any easy bruising or bleeding events.  Appetite is good.  No unintentional weight loss, night sweats, fever.  Review of Systems  Unable to perform ROS: Other (Psychiatry disorder)  Constitutional:  Negative for fatigue.  Hematological:        No acute bleeding events   Past Medical History:  Diagnosis Date   Anxiety    Autism    Autism    Bipolar 1 disorder ([place])    Depression    Past Surgical History:  Procedure Laterality Date   EYE SURGERY      Family History  Problem Relation Age of Onset   Diabetes Mother    Dementia Paternal Grandfather     Social History   Socioeconomic History   Marital status: Married    Spouse name: Not on file   Number of children: Not on file   Years of education: Not on file   Highest education level: Not on file  Occupational History   Not on file  Tobacco Use   Smoking status: Never   Smokeless tobacco: Never  Vaping Use   Vaping Use: Never used  Substance and Sexual Activity   Alcohol use: Never   Drug use: Never   Sexual activity: Yes   Other Topics Concern   Not on file  Social History Narrative   Not on file   Social Determinants of Health   Financial Resource Strain: Not on file  Food Insecurity: Not on file  Transportation Needs: Not on file  Physical Activity: Not on file  Stress: Not on file  Social Connections: Not on file  Intimate Partner Violence: Not on file    Current Outpatient Medications on File Prior to Visit  Medication Sig Dispense Refill   ARIPiprazole (ABILIFY) 20 MG tablet Take 1 tablet (20 mg total) by mouth daily. 30 tablet 1   LORazepam (ATIVAN) 0.5 MG tablet Take 1 tablet (0.5 mg total) by mouth every 8 (eight) hours as needed for anxiety. 90 tablet 1   mirtazapine (REMERON) 30 MG tablet Take 1 tablet (30 mg total) by mouth at bedtime. 90 tablet 1   propranolol (INDERAL) 10 MG tablet Take 1 tablet (10 mg total) by mouth 2 (two) times daily.     No current facility-administered medications on file prior to visit.    No Known Allergies     Observations/Objective: Today's Vitals   05/07/22 1435  PainSc: 0-No pain   There is no height or weight on file to calculate BMI.  Physical Exam Neurological:     Mental Status: He is alert.    CBC    Component Value Date/Time   WBC 6.9 05/06/2022 1143   RBC 4.80  05/06/2022 1143   HGB 13.9 05/06/2022 1143   HGB 14.1 09/24/2020 1420   HCT 41.0 05/06/2022 1143   HCT 41.3 09/24/2020 1420   PLT 54 (L) 05/06/2022 1143   PLT 14 (LL) 09/24/2020 1420   MCV 85.4 05/06/2022 1143   MCV 82 09/24/2020 1420   MCH 29.0 05/06/2022 1143   MCHC 33.9 05/06/2022 1143   RDW 13.3 05/06/2022 1143   RDW 12.7 09/24/2020 1420   LYMPHSABS 2.5 05/06/2022 1143   LYMPHSABS 2.9 09/24/2020 1420   MONOABS 0.7 05/06/2022 1143   EOSABS 0.1 05/06/2022 1143   EOSABS 0.1 09/24/2020 1420   BASOSABS 0.1 05/06/2022 1143   BASOSABS 0.1 09/24/2020 1420    CMP     Component Value Date/Time   NA 140 05/06/2022 1143   NA 144 09/24/2020 1420   K 4.4 05/06/2022 1143    CL 105 05/06/2022 1143   CO2 30 05/06/2022 1143   GLUCOSE 86 05/06/2022 1143   BUN 21 (H) 05/06/2022 1143   BUN 13 09/24/2020 1420   CREATININE 0.96 05/06/2022 1143   CALCIUM 8.6 (L) 05/06/2022 1143   PROT 7.1 05/06/2022 1143   PROT 7.0 09/24/2020 1420   ALBUMIN 4.3 05/06/2022 1143   ALBUMIN 4.5 09/24/2020 1420   AST 16 05/06/2022 1143   ALT 23 05/06/2022 1143   ALKPHOS 48 05/06/2022 1143   BILITOT 1.1 05/06/2022 1143   BILITOT 0.6 09/24/2020 1420   GFRNONAA >60 05/06/2022 1143   GFRAA 121 09/24/2020 1420     Assessment and Plan: 1. B-cell lymphoproliferative disorder ([place])   2. MGUS (monoclonal gammopathy of unknown significance)   3. Thrombocytopenia ([facility])     Thrombocytopenia, previously treated with rituximab. Labs reviewed and discussed with patient. Platelet count is stable at 54,000. Continue observation.   Low-grade B-cell lymphoproliferative disorder.  Status post rituximab treatment for both ITP and low-grade B-cell lymphoproliferative disorder.   Patient is asymptomatic. No constitutional symptoms Consider repeat rituximab +/- additional treatment if patient becomes symptomatic or platelet count drops below 30,000.  IgG Kappa MGUS, light chain ratio is stable.  Multiple myeloma panel is pending at the time of dictation.  Follow Up Instructions: CBC in 3 months Lab md 6 months.    I discussed the assessment and treatment plan with the patient. The patient was provided an opportunity to ask questions and all were answered. The patient agreed with the plan and demonstrated an understanding of the instructions.  The patient was advised to call back or seek an in-person evaluation if the symptoms worsen or if the condition fails to improve as anticipated.    Earlie Server, MD 05/08/2022 8:41 AM

## 2022-05-09 LAB — MULTIPLE MYELOMA PANEL, SERUM
Albumin SerPl Elph-Mcnc: 4.2 g/dL (ref 2.9–4.4)
Albumin/Glob SerPl: 1.7 (ref 0.7–1.7)
Alpha 1: 0.1 g/dL (ref 0.0–0.4)
Alpha2 Glob SerPl Elph-Mcnc: 0.4 g/dL (ref 0.4–1.0)
B-Globulin SerPl Elph-Mcnc: 0.7 g/dL (ref 0.7–1.3)
Gamma Glob SerPl Elph-Mcnc: 1.3 g/dL (ref 0.4–1.8)
Globulin, Total: 2.6 g/dL (ref 2.2–3.9)
IgA: 136 mg/dL (ref 90–386)
IgG (Immunoglobin G), Serum: 1166 mg/dL (ref 603–1613)
IgM (Immunoglobulin M), Srm: 363 mg/dL — ABNORMAL HIGH (ref 20–172)
M Protein SerPl Elph-Mcnc: 0.5 g/dL — ABNORMAL HIGH
Total Protein ELP: 6.8 g/dL (ref 6.0–8.5)

## 2022-05-24 ENCOUNTER — Other Ambulatory Visit: Payer: Self-pay | Admitting: Nurse Practitioner

## 2022-08-07 ENCOUNTER — Inpatient Hospital Stay: Payer: Medicare HMO | Attending: Oncology

## 2022-08-07 DIAGNOSIS — D472 Monoclonal gammopathy: Secondary | ICD-10-CM | POA: Diagnosis present

## 2022-08-07 DIAGNOSIS — D696 Thrombocytopenia, unspecified: Secondary | ICD-10-CM | POA: Diagnosis not present

## 2022-08-07 LAB — COMPREHENSIVE METABOLIC PANEL
ALT: 19 U/L (ref 0–44)
AST: 18 U/L (ref 15–41)
Albumin: 4.3 g/dL (ref 3.5–5.0)
Alkaline Phosphatase: 59 U/L (ref 38–126)
Anion gap: 9 (ref 5–15)
BUN: 19 mg/dL (ref 6–20)
CO2: 29 mmol/L (ref 22–32)
Calcium: 9.1 mg/dL (ref 8.9–10.3)
Chloride: 100 mmol/L (ref 98–111)
Creatinine, Ser: 1.08 mg/dL (ref 0.61–1.24)
GFR, Estimated: >60 mL/min (ref >60)
Glucose, Bld: 172 mg/dL — ABNORMAL HIGH (ref 70–99)
Potassium: 3.6 mmol/L (ref 3.5–5.1)
Sodium: 138 mmol/L (ref 135–145)
Total Bilirubin: 1.5 mg/dL — ABNORMAL HIGH (ref 0.3–1.2)
Total Protein: 7.1 g/dL (ref 6.5–8.1)

## 2022-08-07 LAB — CBC WITH DIFFERENTIAL/PLATELET
Abs Immature Granulocytes: 0.12 10*3/uL — ABNORMAL HIGH (ref 0.00–0.07)
Basophils Absolute: 0.1 10*3/uL (ref 0.0–0.1)
Basophils Relative: 1 %
Eosinophils Absolute: 0.1 10*3/uL (ref 0.0–0.5)
Eosinophils Relative: 2 %
HCT: 39.4 % (ref 39.0–52.0)
Hemoglobin: 13.7 g/dL (ref 13.0–17.0)
Immature Granulocytes: 2 %
Lymphocytes Relative: 28 %
Lymphs Abs: 2.3 10*3/uL (ref 0.7–4.0)
MCH: 29.3 pg (ref 26.0–34.0)
MCHC: 34.8 g/dL (ref 30.0–36.0)
MCV: 84.2 fL (ref 80.0–100.0)
Monocytes Absolute: 0.6 10*3/uL (ref 0.1–1.0)
Monocytes Relative: 7 %
Neutro Abs: 4.9 10*3/uL (ref 1.7–7.7)
Neutrophils Relative %: 60 %
Platelets: 48 10*3/uL — ABNORMAL LOW (ref 150–400)
RBC: 4.68 MIL/uL (ref 4.22–5.81)
RDW: 12.5 % (ref 11.5–15.5)
WBC: 8 10*3/uL (ref 4.0–10.5)
nRBC: 0 % (ref 0.0–0.2)

## 2022-08-07 LAB — LACTATE DEHYDROGENASE: LDH: 113 U/L (ref 98–192)

## 2022-08-07 LAB — IMMATURE PLATELET FRACTION: Immature Platelet Fraction: 20.6 % — ABNORMAL HIGH (ref 1.2–8.6)

## 2022-11-06 ENCOUNTER — Inpatient Hospital Stay (HOSPITAL_BASED_OUTPATIENT_CLINIC_OR_DEPARTMENT_OTHER): Payer: Medicare HMO | Admitting: Oncology

## 2022-11-06 ENCOUNTER — Encounter: Payer: Self-pay | Admitting: Oncology

## 2022-11-06 ENCOUNTER — Inpatient Hospital Stay: Payer: Medicare HMO | Attending: Oncology

## 2022-11-06 VITALS — BP 115/79 | HR 70 | Temp 97.1°F | Resp 97 | Wt 170.5 lb

## 2022-11-06 DIAGNOSIS — F319 Bipolar disorder, unspecified: Secondary | ICD-10-CM | POA: Diagnosis not present

## 2022-11-06 DIAGNOSIS — D696 Thrombocytopenia, unspecified: Secondary | ICD-10-CM | POA: Insufficient documentation

## 2022-11-06 DIAGNOSIS — Z79899 Other long term (current) drug therapy: Secondary | ICD-10-CM | POA: Diagnosis not present

## 2022-11-06 DIAGNOSIS — D47Z9 Other specified neoplasms of uncertain behavior of lymphoid, hematopoietic and related tissue: Secondary | ICD-10-CM | POA: Diagnosis not present

## 2022-11-06 DIAGNOSIS — F840 Autistic disorder: Secondary | ICD-10-CM | POA: Insufficient documentation

## 2022-11-06 DIAGNOSIS — D479 Neoplasm of uncertain behavior of lymphoid, hematopoietic and related tissue, unspecified: Secondary | ICD-10-CM | POA: Diagnosis not present

## 2022-11-06 DIAGNOSIS — D472 Monoclonal gammopathy: Secondary | ICD-10-CM

## 2022-11-06 LAB — COMPREHENSIVE METABOLIC PANEL
ALT: 21 U/L (ref 0–44)
AST: 16 U/L (ref 15–41)
Albumin: 4.4 g/dL (ref 3.5–5.0)
Alkaline Phosphatase: 57 U/L (ref 38–126)
Anion gap: 6 (ref 5–15)
BUN: 25 mg/dL — ABNORMAL HIGH (ref 6–20)
CO2: 29 mmol/L (ref 22–32)
Calcium: 9 mg/dL (ref 8.9–10.3)
Chloride: 103 mmol/L (ref 98–111)
Creatinine, Ser: 0.91 mg/dL (ref 0.61–1.24)
GFR, Estimated: >60 mL/min (ref >60)
Glucose, Bld: 86 mg/dL (ref 70–99)
Potassium: 4.6 mmol/L (ref 3.5–5.1)
Sodium: 138 mmol/L (ref 135–145)
Total Bilirubin: 1.5 mg/dL — ABNORMAL HIGH (ref 0.3–1.2)
Total Protein: 7.5 g/dL (ref 6.5–8.1)

## 2022-11-06 LAB — CBC WITH DIFFERENTIAL/PLATELET
Abs Immature Granulocytes: 0.21 10*3/uL — ABNORMAL HIGH (ref 0.00–0.07)
Basophils Absolute: 0.1 10*3/uL (ref 0.0–0.1)
Basophils Relative: 2 %
Eosinophils Absolute: 0.1 10*3/uL (ref 0.0–0.5)
Eosinophils Relative: 1 %
HCT: 42 % (ref 39.0–52.0)
Hemoglobin: 14.4 g/dL (ref 13.0–17.0)
Immature Granulocytes: 3 %
Lymphocytes Relative: 34 %
Lymphs Abs: 2.9 10*3/uL (ref 0.7–4.0)
MCH: 29.5 pg (ref 26.0–34.0)
MCHC: 34.3 g/dL (ref 30.0–36.0)
MCV: 86.1 fL (ref 80.0–100.0)
Monocytes Absolute: 0.9 10*3/uL (ref 0.1–1.0)
Monocytes Relative: 11 %
Neutro Abs: 4.3 10*3/uL (ref 1.7–7.7)
Neutrophils Relative %: 49 %
Platelets: 48 10*3/uL — ABNORMAL LOW (ref 150–400)
RBC: 4.88 MIL/uL (ref 4.22–5.81)
RDW: 13.2 % (ref 11.5–15.5)
WBC: 8.5 10*3/uL (ref 4.0–10.5)
nRBC: 0 % (ref 0.0–0.2)

## 2022-11-06 LAB — IMMATURE PLATELET FRACTION: Immature Platelet Fraction: 21.1 % — ABNORMAL HIGH (ref 1.2–8.6)

## 2022-11-06 LAB — LACTATE DEHYDROGENASE: LDH: 127 U/L (ref 98–192)

## 2022-11-06 NOTE — Assessment & Plan Note (Deleted)
#  Thrombocytopenia, due to peripheral destruction/sequestion or consumption.  primary ITP versus secondary ITP related to underlying B-cell lymphoproliferative disorder.  See below. Previous bone marrow biopsy indicates underlying B-cell lymphoproliferative disorder.  + B-cell rearrangement, - [id number], - t(11,18) Cytogenetics is normal.  Labs are reviewed and discussed with patient. Stable platelet counts. Continue observation.  

## 2022-11-06 NOTE — Progress Notes (Signed)
Hematology/Oncology Progress note [phone] [phone]      Patient Care Team: Mar Daring, PA-C as PCP - General (Family Medicine) Earlie Server, MD as Consulting Physician (Hematology and Oncology)  ASSESSMENT & PLAN:   Thrombocytopenia [facility]) #Thrombocytopenia, due to peripheral destruction/sequestion or consumption.  primary ITP versus secondary ITP related to underlying B-cell lymphoproliferative disorder.  See below. Previous bone marrow biopsy indicates underlying B-cell lymphoproliferative disorder.  + B-cell rearrangement, - MYD88, - t(11,18) Cytogenetics is normal.  Labs are reviewed and discussed with patient. Stable platelet counts. Continue observation.   B-cell lymphoproliferative disorder (HCC) IgM MGUS, monoclonal lymphocytosis, mild splenomegaly. Status post weekly rituximab x 4   Repeat US abdomen in 6 months  MGUS (monoclonal gammopathy of unknown significance) Observation.   Orders Placed This Encounter  Procedures   US Abdomen Complete    Standing Status:   Future    Standing Expiration Date:   11/06/2023    Order Specific Question:   Reason for Exam (SYMPTOM  OR DIAGNOSIS REQUIRED)    Answer:   thrombocytopenia    Order Specific Question:   Preferred imaging location?    Answer:   [facility]   CBC with Differential/Platelet    Standing Status:   Future    Standing Expiration Date:   11/07/2023   Comprehensive metabolic panel    Standing Status:   Future    Standing Expiration Date:   11/06/2023   Immature Platelet Fraction    Standing Status:   Future    Standing Expiration Date:   11/07/2023   Lactate dehydrogenase    Standing Status:   Future    Standing Expiration Date:   11/07/2023   CBC with Differential/Platelet    Standing Status:   Future    Standing Expiration Date:   11/07/2023   Follow up in 6 months.  All questions were answered. The patient knows to call the clinic with any problems, questions  or concerns.  Earlie Server, MD, PhD [facility] Hematology Oncology 11/06/2022   REASON FOR VISIT Follow up for treatment of thrombocytopenia,  low grade B cell lymphoma, splenomegaly  HISTORY OF PRESENTING ILLNESS:  Steven Knight is a  51 y.o.  male presents for follow up of thrombocytopenia, low grade B cell lymphoma. splenomegaly Patient has autism and bipolar disease.  Poor historian Off Depakote.   History is obtained from wife.  # had a course of 4 days of dexamethasone and the platelet count normalized to 160.000 # 11/27/20 - 12/19/2020 weekly treatments of rituximab x 4.  INTERVAL HISTORY Steven Knight is a 51 y.o. male who has above history reviewed by me today presents for follow-up of thrombocytopenia, low-grade B-cell lymphoproliferative disorder He was accompanied by his wife.  No new complaints. No active bleeding events.   .Review of Systems  Unable to perform ROS: Psychiatric disorder  Constitutional:  Negative for fever, malaise/fatigue and weight loss.  Respiratory:  Negative for cough.   Cardiovascular:  Negative for palpitations and leg swelling.  Gastrointestinal:  Negative for nausea and vomiting.  Genitourinary:  Negative for urgency.  Skin:  Negative for rash.  Neurological:  Negative for dizziness.  Endo/Heme/Allergies:  Does not bruise/bleed easily.  Psychiatric/Behavioral:  The patient is not nervous/anxious.     MEDICAL HISTORY:  Past Medical History:  Diagnosis Date   Anxiety    Autism    Autism    Bipolar 1 disorder ([place])    Depression     SURGICAL  HISTORY: Past Surgical History:  Procedure Laterality Date   EYE SURGERY     No surgery SOCIAL HISTORY: Social History   Socioeconomic History   Marital status: Married    Spouse name: Not on file   Number of children: Not on file   Years of education: Not on file   Highest education level: Not on file  Occupational History   Not on file  Tobacco Use   Smoking status:  Never   Smokeless tobacco: Never  Vaping Use   Vaping Use: Never used  Substance and Sexual Activity   Alcohol use: Never   Drug use: Never   Sexual activity: Yes  Other Topics Concern   Not on file  Social History Narrative   Not on file   Social Determinants of Health   Financial Resource Strain: Not on file  Food Insecurity: Not on file  Transportation Needs: Not on file  Physical Activity: Not on file  Stress: Not on file  Social Connections: Not on file  Intimate Partner Violence: Not on file    FAMILY HISTORY: Family History  Problem Relation Age of Onset   Diabetes Mother    Dementia Paternal Grandfather     ALLERGIES:  has No Known Allergies.  MEDICATIONS:  Current Outpatient Medications  Medication Sig Dispense Refill   ARIPiprazole (ABILIFY) 20 MG tablet Take 1 tablet (20 mg total) by mouth daily. 30 tablet 1   LORazepam (ATIVAN) 0.5 MG tablet Take 1 tablet (0.5 mg total) by mouth every 8 (eight) hours as needed for anxiety. 90 tablet 1   mirtazapine (REMERON) 30 MG tablet Take 1 tablet (30 mg total) by mouth at bedtime. 90 tablet 1   mirtazapine (REMERON) 30 MG tablet Take by mouth.     propranolol (INDERAL) 10 MG tablet Take 1 tablet (10 mg total) by mouth 2 (two) times daily.     No current facility-administered medications for this visit.     PHYSICAL EXAMINATION: ECOG PERFORMANCE STATUS: 1 - Symptomatic but completely ambulatory Vitals:   11/06/22 1104  BP: 115/79  Pulse: 70  Resp: (!) 97  Temp: (!) 97.1 F (36.2 C)   Filed Weights   11/06/22 1104  Weight: 170 lb 8 oz (77.3 kg)    Physical Exam Constitutional:      General: He is not in acute distress. HENT:     Head: Normocephalic and atraumatic.  Eyes:     General: No scleral icterus.    Pupils: Pupils are equal, round, and reactive to light.  Cardiovascular:     Rate and Rhythm: Normal rate and regular rhythm.     Heart sounds: Normal heart sounds.  Pulmonary:     Effort:  Pulmonary effort is normal. No respiratory distress.     Breath sounds: No wheezing.  Abdominal:     General: Bowel sounds are normal.     Palpations: Abdomen is soft.  Musculoskeletal:        General: No deformity. Normal range of motion.     Cervical back: Normal range of motion and neck supple.  Skin:    General: Skin is warm and dry.     Findings: No erythema or rash.  Neurological:     Mental Status: He is alert and oriented to person, place, and time.     Cranial Nerves: No cranial nerve deficit.     Coordination: Coordination normal.      LABORATORY DATA:  I have reviewed the data as listed Lab  Results  Component Value Date   WBC 8.5 11/06/2022   HGB 14.4 11/06/2022   HCT 42.0 11/06/2022   MCV 86.1 11/06/2022   PLT 48 (L) 11/06/2022   Recent Labs    05/06/22 1143 08/07/22 1304 11/06/22 1031  NA 140 138 138  K 4.4 3.6 4.6  CL 105 100 103  CO2 _0 GLUCOSE 86 172* 86  BUN 21* 19 25*  CREATININE 0.96 1.08 0.91  CALCIUM 8.6* 9.1 9.0  GFRNONAA >60 >60 >60  PROT 7.1 7.1 7.5  ALBUMIN 4.3 4.3 4.4  AST _1 ALT _2 ALKPHOS 48 59 57  BILITOT 1.1 1.5* 1.5*    Iron/TIBC/Ferritin/ %Sat No results found for: "IRON", "TIBC", "FERRITIN", "IRONPCTSAT"    Normal LDH, negative hepatitis and HIV, normal B12 and folate level, normal spleen size. Negative flowcytometry. Patient has history of positive anti-TPO.  Patient was seen by rheumatologist last year.

## 2022-11-06 NOTE — Assessment & Plan Note (Signed)
IgM MGUS, monoclonal lymphocytosis, mild splenomegaly. Status post weekly rituximab x 4   Repeat US abdomen in 6 months

## 2022-11-06 NOTE — Assessment & Plan Note (Signed)
#  Thrombocytopenia, due to peripheral destruction/sequestion or consumption.  primary ITP versus secondary ITP related to underlying B-cell lymphoproliferative disorder.  See below. Previous bone marrow biopsy indicates underlying B-cell lymphoproliferative disorder.  + B-cell rearrangement, - MYD88, - t(11,18) Cytogenetics is normal.  Labs are reviewed and discussed with patient. Stable platelet counts. Continue observation.

## 2022-11-06 NOTE — Assessment & Plan Note (Signed)
Observation  

## 2022-11-25 ENCOUNTER — Ambulatory Visit
Admission: RE | Admit: 2022-11-25 | Discharge: 2022-11-25 | Disposition: A | Discharge status: Home / Self Care | Payer: Medicare HMO | Source: Ambulatory Visit | Attending: Oncology | Admitting: Oncology

## 2022-11-25 DIAGNOSIS — D696 Thrombocytopenia, unspecified: Secondary | ICD-10-CM | POA: Insufficient documentation

## 2022-11-25 DIAGNOSIS — D472 Monoclonal gammopathy: Secondary | ICD-10-CM | POA: Insufficient documentation

## 2022-11-25 NOTE — Progress Notes (Unsigned)
Complete physical exam   Patient: Steven Knight   DOB: [birth year]   51 y.o. Male  MRN: [id number] Visit Date: 11/26/2022  Today's healthcare provider: Mikey Kirschner, PA-C   No chief complaint on file.  Subjective    Steven Knight is a 51 y.o. male who presents today for a complete physical exam.  He reports consuming a {diet types:17450} diet. {Exercise:19826} He generally feels {well/fairly well/poorly:18703}. He reports sleeping {well/fairly well/poorly:18703}. He {does/does not:200015} have additional problems to discuss today.  HPI  -Cologuard: 10/05/20 negative -Influenza: -Tetanus:  Past Medical History:  Diagnosis Date   Anxiety    Autism    Autism    Bipolar 1 disorder ([place])    Depression    Past Surgical History:  Procedure Laterality Date   EYE SURGERY     Social History   Socioeconomic History   Marital status: Married    Spouse name: Not on file   Number of children: Not on file   Years of education: Not on file   Highest education level: Not on file  Occupational History   Not on file  Tobacco Use   Smoking status: Never   Smokeless tobacco: Never  Vaping Use   Vaping Use: Never used  Substance and Sexual Activity   Alcohol use: Never   Drug use: Never   Sexual activity: Yes  Other Topics Concern   Not on file  Social History Narrative   Not on file   Social Determinants of Health   Financial Resource Strain: Not on file  Food Insecurity: Not on file  Transportation Needs: Not on file  Physical Activity: Not on file  Stress: Not on file  Social Connections: Not on file  Intimate Partner Violence: Not on file   Family Status  Relation Name Status   Mother  Alive   Father  Alive   PGF  (Not Specified)   Family History  Problem Relation Age of Onset   Diabetes Mother    Dementia Paternal Grandfather    No Known Allergies  Patient Care Team: Mar Daring, PA-C as PCP - General (Family  Medicine) Earlie Server, MD as Consulting Physician (Hematology and Oncology)   Medications: Outpatient Medications Prior to Visit  Medication Sig   ARIPiprazole (ABILIFY) 20 MG tablet Take 1 tablet (20 mg total) by mouth daily.   LORazepam (ATIVAN) 0.5 MG tablet Take 1 tablet (0.5 mg total) by mouth every 8 (eight) hours as needed for anxiety.   mirtazapine (REMERON) 30 MG tablet Take 1 tablet (30 mg total) by mouth at bedtime.   mirtazapine (REMERON) 30 MG tablet Take by mouth.   propranolol (INDERAL) 10 MG tablet Take 1 tablet (10 mg total) by mouth 2 (two) times daily.   No facility-administered medications prior to visit.    Review of Systems  {Labs  Heme  Chem  Endocrine  Serology  Results Review (optional):23779}  Objective    There were no vitals taken for this visit. {Show previous vital signs (optional):23777}   Physical Exam  ***  Last depression screening scores    09/24/2020    1:25 PM  PHQ 2/9 Scores  PHQ - 2 Score 6  PHQ- 9 Score 19   Last fall risk screening     No data to display         Last Audit-C alcohol use screening    09/24/2020    1:25 PM  Alcohol Use Disorder Test (AUDIT)  1. How often do you have a drink containing alcohol? 0  2. How many drinks containing alcohol do you have on a typical day when you are drinking? 0  3. How often do you have six or more drinks on one occasion? 0  AUDIT-C Score 0  Alcohol Brief Interventions/Follow-up AUDIT Score <7 follow-up not indicated   A score of 3 or more in women, and 4 or more in men indicates increased risk for alcohol abuse, EXCEPT if all of the points are from question 1   No results found for any visits on 11/26/22.  Assessment & Plan    Routine Health Maintenance and Physical Exam  Exercise Activities and Dietary recommendations  Goals   None     Immunization History  Administered Date(s) Administered   Influenza-Unspecified 09/02/2018, 08/22/2020   PFIZER(Purple  Top)SARS-COV-2 Vaccination 02/09/2020, 03/01/2020    Health Maintenance  Topic Date Due   Medicare Annual Wellness (AWV)  Never done   DTaP/Tdap/Td (1 - Tdap) Never done   COLONOSCOPY (Pts 45-45yr Insurance coverage will need to be confirmed)  Never done   COVID-19 Vaccine (3 - Pfizer risk series) 03/29/2020   INFLUENZA VACCINE  07/08/2022   Hepatitis C Screening  Completed   HIV Screening  Completed   HPV VACCINES  Aged Out    Discussed health benefits of physical activity, and encouraged him to engage in regular exercise appropriate for his age and condition.  ***  No follow-ups on file.     {provider attestation***:1}   LMikey Kirschner PA-C  [phone](phone) [phone](fax)  [place]

## 2022-11-26 ENCOUNTER — Encounter: Payer: Self-pay | Admitting: Physician Assistant

## 2022-11-26 ENCOUNTER — Ambulatory Visit (INDEPENDENT_AMBULATORY_CARE_PROVIDER_SITE_OTHER): Payer: Medicare HMO | Admitting: Physician Assistant

## 2022-11-26 VITALS — BP 97/71 | HR 75 | Ht 74.0 in | Wt 170.1 lb

## 2022-11-26 DIAGNOSIS — D696 Thrombocytopenia, unspecified: Secondary | ICD-10-CM

## 2022-11-26 DIAGNOSIS — D479 Neoplasm of uncertain behavior of lymphoid, hematopoietic and related tissue, unspecified: Secondary | ICD-10-CM

## 2022-11-26 DIAGNOSIS — E781 Pure hyperglyceridemia: Secondary | ICD-10-CM | POA: Diagnosis not present

## 2022-11-26 DIAGNOSIS — Z0000 Encounter for general adult medical examination without abnormal findings: Secondary | ICD-10-CM | POA: Diagnosis not present

## 2022-11-26 NOTE — Assessment & Plan Note (Signed)
Historically repeat fasting lipids Reviewed other labs, cbc, cmp

## 2022-12-04 ENCOUNTER — Encounter: Payer: Self-pay | Admitting: *Deleted

## 2022-12-04 LAB — LIPID PANEL
Chol/HDL Ratio: 5.8 ratio — ABNORMAL HIGH (ref 0.0–5.0)
Cholesterol, Total: 134 mg/dL (ref 100–199)
HDL: 23 mg/dL — ABNORMAL LOW (ref >39)
LDL Chol Calc (NIH): 77 mg/dL (ref 0–99)
Triglycerides: 198 mg/dL — ABNORMAL HIGH (ref 0–149)
VLDL Cholesterol Cal: 34 mg/dL (ref 5–40)

## 2022-12-04 NOTE — Progress Notes (Signed)
Fats remain elevated; low cholesterol remains low.  I continue to recommend diet low in saturated fat and regular exercise - 30 min at least 5 times per week

## 2023-02-05 ENCOUNTER — Inpatient Hospital Stay: Payer: Medicare HMO | Attending: Oncology

## 2023-02-05 DIAGNOSIS — D696 Thrombocytopenia, unspecified: Secondary | ICD-10-CM | POA: Diagnosis present

## 2023-02-05 DIAGNOSIS — D472 Monoclonal gammopathy: Secondary | ICD-10-CM | POA: Diagnosis not present

## 2023-02-05 LAB — CBC WITH DIFFERENTIAL/PLATELET
Abs Immature Granulocytes: 0.28 10*3/uL — ABNORMAL HIGH (ref 0.00–0.07)
Basophils Absolute: 0.1 10*3/uL (ref 0.0–0.1)
Basophils Relative: 1 %
Eosinophils Absolute: 0.3 10*3/uL (ref 0.0–0.5)
Eosinophils Relative: 4 %
HCT: 36.5 % — ABNORMAL LOW (ref 39.0–52.0)
Hemoglobin: 11.9 g/dL — ABNORMAL LOW (ref 13.0–17.0)
Immature Granulocytes: 3 %
Lymphocytes Relative: 32 %
Lymphs Abs: 2.6 10*3/uL (ref 0.7–4.0)
MCH: 28.5 pg (ref 26.0–34.0)
MCHC: 32.6 g/dL (ref 30.0–36.0)
MCV: 87.5 fL (ref 80.0–100.0)
Monocytes Absolute: 0.8 10*3/uL (ref 0.1–1.0)
Monocytes Relative: 10 %
Neutro Abs: 4 10*3/uL (ref 1.7–7.7)
Neutrophils Relative %: 50 %
Platelets: 98 10*3/uL — ABNORMAL LOW (ref 150–400)
RBC: 4.17 MIL/uL — ABNORMAL LOW (ref 4.22–5.81)
RDW: 13.1 % (ref 11.5–15.5)
WBC: 8.1 10*3/uL (ref 4.0–10.5)
nRBC: 0 % (ref 0.0–0.2)

## 2023-05-07 ENCOUNTER — Inpatient Hospital Stay (HOSPITAL_BASED_OUTPATIENT_CLINIC_OR_DEPARTMENT_OTHER): Payer: Medicare HMO | Admitting: Oncology

## 2023-05-07 ENCOUNTER — Telehealth: Payer: Self-pay | Admitting: Physician Assistant

## 2023-05-07 ENCOUNTER — Inpatient Hospital Stay: Payer: Medicare HMO | Attending: Oncology

## 2023-05-07 ENCOUNTER — Encounter: Payer: Self-pay | Admitting: Oncology

## 2023-05-07 VITALS — BP 105/78 | HR 63 | Temp 96.0°F | Resp 18 | Wt 159.2 lb

## 2023-05-07 DIAGNOSIS — R634 Abnormal weight loss: Secondary | ICD-10-CM | POA: Insufficient documentation

## 2023-05-07 DIAGNOSIS — F319 Bipolar disorder, unspecified: Secondary | ICD-10-CM | POA: Diagnosis not present

## 2023-05-07 DIAGNOSIS — D696 Thrombocytopenia, unspecified: Secondary | ICD-10-CM | POA: Diagnosis not present

## 2023-05-07 DIAGNOSIS — Z1211 Encounter for screening for malignant neoplasm of colon: Secondary | ICD-10-CM

## 2023-05-07 DIAGNOSIS — Z79899 Other long term (current) drug therapy: Secondary | ICD-10-CM | POA: Insufficient documentation

## 2023-05-07 DIAGNOSIS — D479 Neoplasm of uncertain behavior of lymphoid, hematopoietic and related tissue, unspecified: Secondary | ICD-10-CM | POA: Diagnosis present

## 2023-05-07 DIAGNOSIS — D472 Monoclonal gammopathy: Secondary | ICD-10-CM

## 2023-05-07 DIAGNOSIS — R161 Splenomegaly, not elsewhere classified: Secondary | ICD-10-CM

## 2023-05-07 DIAGNOSIS — F840 Autistic disorder: Secondary | ICD-10-CM | POA: Diagnosis not present

## 2023-05-07 LAB — CBC WITH DIFFERENTIAL/PLATELET
Abs Immature Granulocytes: 0.01 10*3/uL (ref 0.00–0.07)
Basophils Absolute: 0.1 10*3/uL (ref 0.0–0.1)
Basophils Relative: 2 %
Eosinophils Absolute: 0 10*3/uL (ref 0.0–0.5)
Eosinophils Relative: 0 %
HCT: 43.7 % (ref 39.0–52.0)
Hemoglobin: 14.5 g/dL (ref 13.0–17.0)
Immature Granulocytes: 0 %
Lymphocytes Relative: 33 %
Lymphs Abs: 1.8 10*3/uL (ref 0.7–4.0)
MCH: 28.7 pg (ref 26.0–34.0)
MCHC: 33.2 g/dL (ref 30.0–36.0)
MCV: 86.4 fL (ref 80.0–100.0)
Monocytes Absolute: 0.5 10*3/uL (ref 0.1–1.0)
Monocytes Relative: 10 %
Neutro Abs: 2.9 10*3/uL (ref 1.7–7.7)
Neutrophils Relative %: 55 %
Platelets: 86 10*3/uL — ABNORMAL LOW (ref 150–400)
RBC: 5.06 MIL/uL (ref 4.22–5.81)
RDW: 12.6 % (ref 11.5–15.5)
WBC: 5.3 10*3/uL (ref 4.0–10.5)
nRBC: 0 % (ref 0.0–0.2)

## 2023-05-07 LAB — COMPREHENSIVE METABOLIC PANEL
ALT: 21 U/L (ref 0–44)
AST: 17 U/L (ref 15–41)
Albumin: 4.4 g/dL (ref 3.5–5.0)
Alkaline Phosphatase: 61 U/L (ref 38–126)
Anion gap: 8 (ref 5–15)
BUN: 12 mg/dL (ref 6–20)
CO2: 27 mmol/L (ref 22–32)
Calcium: 9 mg/dL (ref 8.9–10.3)
Chloride: 103 mmol/L (ref 98–111)
Creatinine, Ser: 0.96 mg/dL (ref 0.61–1.24)
GFR, Estimated: >60 mL/min (ref >60)
Glucose, Bld: 71 mg/dL (ref 70–99)
Potassium: 4.5 mmol/L (ref 3.5–5.1)
Sodium: 138 mmol/L (ref 135–145)
Total Bilirubin: 1 mg/dL (ref 0.3–1.2)
Total Protein: 7.1 g/dL (ref 6.5–8.1)

## 2023-05-07 LAB — IMMATURE PLATELET FRACTION: Immature Platelet Fraction: 13.8 % — ABNORMAL HIGH (ref 1.2–8.6)

## 2023-05-07 LAB — LACTATE DEHYDROGENASE: LDH: 98 U/L (ref 98–192)

## 2023-05-07 NOTE — Assessment & Plan Note (Signed)
Observation  

## 2023-05-07 NOTE — Assessment & Plan Note (Signed)
Unknown etiology. ?medication side effects Follow up in 3 months.

## 2023-05-07 NOTE — Assessment & Plan Note (Addendum)
IgM MGUS, monoclonal lymphocytosis, mild splenomegaly. Status post weekly rituximab x 4   Continue observation.

## 2023-05-07 NOTE — Assessment & Plan Note (Signed)
#  Thrombocytopenia, due to peripheral destruction/sequestion or consumption.  primary ITP versus secondary ITP related to underlying B-cell lymphoproliferative disorder.  See below. Previous bone marrow biopsy indicates underlying B-cell lymphoproliferative disorder.  + B-cell rearrangement, - MYD88, - t(11,18) Cytogenetics is normal.  Labs are reviewed and discussed with patient. Stable platelet counts. Continue observation.  

## 2023-05-07 NOTE — Telephone Encounter (Signed)
Name:  Steven Knight  Phone Number: [phone]  Relationship: wife  Is calling to request an order for an colonoscopy. Please advise

## 2023-05-07 NOTE — Assessment & Plan Note (Signed)
Normal spleen size on recent US

## 2023-05-07 NOTE — Progress Notes (Signed)
Hematology/Oncology Progress note [phone] [phone]      Patient Care Team: Alfredia Ferguson, PA-C as PCP - General (Physician Assistant) Rickard Patience, MD as Consulting Physician (Hematology and Oncology)  ASSESSMENT & PLAN:   B-cell lymphoproliferative disorder (HCC) IgM MGUS, monoclonal lymphocytosis, mild splenomegaly. Status post weekly rituximab x 4   Continue observation.    Splenomegaly Normal spleen size on recent US  MGUS (monoclonal gammopathy of unknown significance) Observation.   Thrombocytopenia (HCC) #Thrombocytopenia, due to peripheral destruction/sequestion or consumption.  primary ITP versus secondary ITP related to underlying B-cell lymphoproliferative disorder.  See below. Previous bone marrow biopsy indicates underlying B-cell lymphoproliferative disorder.  + B-cell rearrangement, - MYD88, - t(11,18) Cytogenetics is normal.  Labs are reviewed and discussed with patient. Stable platelet counts. Continue observation.   Weight loss Unknown etiology. ?medication side effects Follow up in 3 months.    Orders Placed This Encounter  Procedures   Multiple Myeloma Panel (SPEP&IFE w/QIG)    Standing Status:   Future    Standing Expiration Date:   05/06/2024   Kappa/lambda light chains    Standing Status:   Future    Standing Expiration Date:   05/06/2024   CBC with Differential/Platelet    Standing Status:   Future    Standing Expiration Date:   05/06/2024   Immature Platelet Fraction    Standing Status:   Future    Standing Expiration Date:   05/06/2024   Lactate dehydrogenase    Standing Status:   Future    Standing Expiration Date:   05/06/2024   Comprehensive metabolic panel    Standing Status:   Future    Standing Expiration Date:   05/06/2024   Flow cytometry panel-leukemia/lymphoma work-up    Standing Status:   Future    Standing Expiration Date:   05/06/2024   Follow up in 3 months.  All questions were answered. The  patient knows to call the clinic with any problems, questions or concerns.  Rickard Patience, MD, PhD [facility] Hematology Oncology 05/07/2023   REASON FOR VISIT Follow up for treatment of thrombocytopenia,  low grade B cell lymphoma, splenomegaly  HISTORY OF PRESENTING ILLNESS:  Steven Knight is a  52 y.o.  male presents for follow up of thrombocytopenia, low grade B cell lymphoma. splenomegaly Patient has autism and bipolar disease.  Poor historian Off Depakote.   History is obtained from wife.  # had a course of 4 days of dexamethasone and the platelet count normalized to 160.000 # 11/27/20 - 12/19/2020 weekly treatments of rituximab x 4.  INTERVAL HISTORY SANJIV PETIT is a 52 y.o. male who has above history reviewed by me today presents for follow-up of thrombocytopenia, low-grade B-cell lymphoproliferative disorder He was accompanied by his wife.  No new complaints. No active bleeding events.  Patient has good appetite.  He has lost 10 pounds since last visit. His Abilify dose has been lowered recently, and started on lamotrigine for about 2 months.  .Review of Systems  Unable to perform ROS: Psychiatric disorder  Constitutional:  Negative for fever, malaise/fatigue and weight loss.  Respiratory:  Negative for cough.   Cardiovascular:  Negative for palpitations and leg swelling.  Gastrointestinal:  Negative for nausea and vomiting.  Genitourinary:  Negative for urgency.  Skin:  Negative for rash.  Neurological:  Negative for dizziness.  Endo/Heme/Allergies:  Does not bruise/bleed easily.  Psychiatric/Behavioral:  The patient is not nervous/anxious.     MEDICAL HISTORY:  Past Medical  History:  Diagnosis Date   Anxiety    Autism    Autism    Bipolar 1 disorder (HCC)    Depression     SURGICAL HISTORY: Past Surgical History:  Procedure Laterality Date   EYE SURGERY     No surgery SOCIAL HISTORY: Social History   Socioeconomic History   Marital  status: Married    Spouse name: Not on file   Number of children: Not on file   Years of education: Not on file   Highest education level: Not on file  Occupational History   Not on file  Tobacco Use   Smoking status: Never   Smokeless tobacco: Never  Vaping Use   Vaping Use: Never used  Substance and Sexual Activity   Alcohol use: Never   Drug use: Never   Sexual activity: Yes  Other Topics Concern   Not on file  Social History Narrative   Not on file   Social Determinants of Health   Financial Resource Strain: Not on file  Food Insecurity: Not on file  Transportation Needs: Not on file  Physical Activity: Not on file  Stress: Not on file  Social Connections: Not on file  Intimate Partner Violence: Not on file    FAMILY HISTORY: Family History  Problem Relation Age of Onset   Diabetes Mother    Dementia Paternal Grandfather     ALLERGIES:  has No Known Allergies.  MEDICATIONS:  Current Outpatient Medications  Medication Sig Dispense Refill   ARIPiprazole (ABILIFY) 5 MG tablet Take by mouth.     lamoTRIgine (LAMICTAL) 200 MG tablet Take by mouth.     LORazepam (ATIVAN) 0.5 MG tablet Take 1 tablet (0.5 mg total) by mouth every 8 (eight) hours as needed for anxiety. 90 tablet 1   mirtazapine (REMERON) 30 MG tablet Take 1 tablet (30 mg total) by mouth at bedtime. 90 tablet 1   propranolol (INDERAL) 10 MG tablet Take 1 tablet (10 mg total) by mouth 2 (two) times daily.     ARIPiprazole (ABILIFY) 10 MG tablet Take 5 mg by mouth daily. (Patient not taking: Reported on 05/07/2023)     ARIPiprazole (ABILIFY) 20 MG tablet Take 1 tablet (20 mg total) by mouth daily. (Patient not taking: Reported on 05/07/2023) 30 tablet 1   mirtazapine (REMERON) 30 MG tablet Take by mouth. (Patient not taking: Reported on 05/07/2023)     No current facility-administered medications for this visit.     PHYSICAL EXAMINATION: ECOG PERFORMANCE STATUS: 1 - Symptomatic but completely  ambulatory Vitals:   05/07/23 0934  BP: 105/78  Pulse: 63  Resp: 18  Temp: (!) 96 F (35.6 C)  SpO2: 100%   Filed Weights   05/07/23 0934  Weight: 159 lb 3.2 oz (72.2 kg)    Physical Exam Constitutional:      General: He is not in acute distress. HENT:     Head: Normocephalic and atraumatic.  Eyes:     General: No scleral icterus.    Pupils: Pupils are equal, round, and reactive to light.  Cardiovascular:     Rate and Rhythm: Normal rate and regular rhythm.     Heart sounds: Normal heart sounds.  Pulmonary:     Effort: Pulmonary effort is normal. No respiratory distress.     Breath sounds: No wheezing.  Abdominal:     General: Bowel sounds are normal.     Palpations: Abdomen is soft.  Musculoskeletal:        General:  No deformity. Normal range of motion.     Cervical back: Normal range of motion and neck supple.  Skin:    General: Skin is warm and dry.     Findings: No erythema or rash.  Neurological:     Mental Status: He is alert and oriented to person, place, and time.     Cranial Nerves: No cranial nerve deficit.     Coordination: Coordination normal.      LABORATORY DATA:  I have reviewed the data as listed Lab Results  Component Value Date   WBC 5.3 05/07/2023   HGB 14.5 05/07/2023   HCT 43.7 05/07/2023   MCV 86.4 05/07/2023   PLT 86 (L) 05/07/2023   Recent Labs    08/07/22 1304 11/06/22 1031 05/07/23 0922  NA 138 138 138  K 3.6 4.6 4.5  CL 100 103 103  CO2 29 29 27   GLUCOSE 172* 86 71  BUN 19 25* 12  CREATININE 1.08 0.91 0.96  CALCIUM 9.1 9.0 9.0  GFRNONAA >60 >60 >60  PROT 7.1 7.5 7.1  ALBUMIN 4.3 4.4 4.4  AST 18 16 17   ALT 19 21 21   ALKPHOS 59 57 61  BILITOT 1.5* 1.5* 1.0    Iron/TIBC/Ferritin/ %Sat No results found for: "IRON", "TIBC", "FERRITIN", "IRONPCTSAT"    Normal LDH, negative hepatitis and HIV, normal B12 and folate level, normal spleen size. Negative flowcytometry. Patient has history of positive anti-TPO.  Patient  was seen by rheumatologist last year.

## 2023-05-08 ENCOUNTER — Other Ambulatory Visit: Payer: Self-pay

## 2023-05-08 ENCOUNTER — Telehealth: Payer: Self-pay

## 2023-05-08 DIAGNOSIS — Z1211 Encounter for screening for malignant neoplasm of colon: Secondary | ICD-10-CM

## 2023-05-08 MED ORDER — NA SULFATE-K SULFATE-MG SULF 17.5-3.13-1.6 GM/177ML PO SOLN: 1.0000 | Freq: Once | ORAL | 0 refills | Status: AC

## 2023-05-08 NOTE — Telephone Encounter (Signed)
Gastroenterology Pre-Procedure Review  Request Date: 05/20/23 Requesting Physician: Dr. Servando Snare  PATIENT REVIEW QUESTIONS: The patient's wife Dorann Lodge (DPR checked) responded to the following health history questions as indicated:    1. Are you having any GI issues? no 2. Do you have a personal history of Polyps? no 3. Do you have a family history of Colon Cancer or Polyps? no 4. Diabetes Mellitus? no 5. Joint replacements in the past 12 months?no 6. Major health problems in the past 3 months?no 7. Any artificial heart valves, MVP, or defibrillator?no    MEDICATIONS & ALLERGIES:    Patient reports the following regarding taking any anticoagulation/antiplatelet therapy:   Plavix, Coumadin, Eliquis, Xarelto, Lovenox, Pradaxa, Brilinta, or Effient? no Aspirin? no  Patient confirms/reports the following medications:  Current Outpatient Medications  Medication Sig Dispense Refill   ARIPiprazole (ABILIFY) 10 MG tablet Take 5 mg by mouth daily. (Patient not taking: Reported on 05/07/2023)     ARIPiprazole (ABILIFY) 20 MG tablet Take 1 tablet (20 mg total) by mouth daily. (Patient not taking: Reported on 05/07/2023) 30 tablet 1   ARIPiprazole (ABILIFY) 5 MG tablet Take by mouth.     lamoTRIgine (LAMICTAL) 200 MG tablet Take by mouth.     LORazepam (ATIVAN) 0.5 MG tablet Take 1 tablet (0.5 mg total) by mouth every 8 (eight) hours as needed for anxiety. 90 tablet 1   mirtazapine (REMERON) 30 MG tablet Take 1 tablet (30 mg total) by mouth at bedtime. 90 tablet 1   mirtazapine (REMERON) 30 MG tablet Take by mouth. (Patient not taking: Reported on 05/07/2023)     propranolol (INDERAL) 10 MG tablet Take 1 tablet (10 mg total) by mouth 2 (two) times daily.     No current facility-administered medications for this visit.    Patient confirms/reports the following allergies:  No Known Allergies  No orders of the defined types were placed in this encounter.   AUTHORIZATION INFORMATION Primary  Insurance: 1D#: Group #:  Secondary Insurance: 1D#: Group #:  SCHEDULE INFORMATION: Date: 05/20/23 Time: Location: [facility]

## 2023-05-13 ENCOUNTER — Encounter: Payer: Self-pay | Admitting: Gastroenterology

## 2023-05-19 ENCOUNTER — Encounter: Payer: Self-pay | Admitting: Gastroenterology

## 2023-05-20 ENCOUNTER — Encounter
Admission: RE | Disposition: A | Discharge status: Home / Self Care | Payer: Self-pay | Source: Ambulatory Visit | Attending: Gastroenterology

## 2023-05-20 ENCOUNTER — Ambulatory Visit
Admission: RE | Admit: 2023-05-20 | Discharge: 2023-05-20 | Disposition: A | Discharge status: Home / Self Care | Payer: Medicare HMO | Source: Ambulatory Visit | Attending: Gastroenterology | Admitting: Gastroenterology

## 2023-05-20 ENCOUNTER — Ambulatory Visit: Payer: Medicare HMO | Admitting: Anesthesiology

## 2023-05-20 DIAGNOSIS — D696 Thrombocytopenia, unspecified: Secondary | ICD-10-CM | POA: Diagnosis not present

## 2023-05-20 DIAGNOSIS — D472 Monoclonal gammopathy: Secondary | ICD-10-CM | POA: Insufficient documentation

## 2023-05-20 DIAGNOSIS — F419 Anxiety disorder, unspecified: Secondary | ICD-10-CM | POA: Insufficient documentation

## 2023-05-20 DIAGNOSIS — F840 Autistic disorder: Secondary | ICD-10-CM | POA: Insufficient documentation

## 2023-05-20 DIAGNOSIS — Z1211 Encounter for screening for malignant neoplasm of colon: Secondary | ICD-10-CM | POA: Insufficient documentation

## 2023-05-20 DIAGNOSIS — F319 Bipolar disorder, unspecified: Secondary | ICD-10-CM | POA: Diagnosis not present

## 2023-05-20 DIAGNOSIS — K640 First degree hemorrhoids: Secondary | ICD-10-CM | POA: Insufficient documentation

## 2023-05-20 HISTORY — PX: COLONOSCOPY WITH PROPOFOL: SHX5780

## 2023-05-20 SURGERY — COLONOSCOPY WITH PROPOFOL
Anesthesia: General

## 2023-05-20 MED ORDER — SODIUM CHLORIDE 0.9 % IV SOLN
INTRAVENOUS | Status: DC
  Administered 2023-05-20: 20 mL/h via INTRAVENOUS

## 2023-05-20 MED ORDER — PROPOFOL 500 MG/50ML IV EMUL
INTRAVENOUS | Status: DC | PRN
  Administered 2023-05-20: 175 ug/kg/min via INTRAVENOUS

## 2023-05-20 MED ORDER — LIDOCAINE HCL (CARDIAC) PF 100 MG/5ML IV SOSY
PREFILLED_SYRINGE | INTRAVENOUS | Status: DC | PRN
  Administered 2023-05-20: 40 mg via INTRAVENOUS

## 2023-05-20 MED ORDER — GLYCOPYRROLATE 0.2 MG/ML IJ SOLN
INTRAMUSCULAR | Status: DC | PRN
  Administered 2023-05-20: .2 mg via INTRAVENOUS

## 2023-05-20 MED ORDER — PROPOFOL 10 MG/ML IV BOLUS
INTRAVENOUS | Status: DC | PRN
  Administered 2023-05-20: 90 mg via INTRAVENOUS
  Administered 2023-05-20: 20 mg via INTRAVENOUS
  Administered 2023-05-20 (×2): 10 mg via INTRAVENOUS
  Administered 2023-05-20: 20 mg via INTRAVENOUS

## 2023-05-20 MED ORDER — DEXMEDETOMIDINE HCL IN NACL 200 MCG/50ML IV SOLN
INTRAVENOUS | Status: DC | PRN
  Administered 2023-05-20: 12 ug via INTRAVENOUS
  Administered 2023-05-20: 8 ug via INTRAVENOUS

## 2023-05-20 NOTE — Anesthesia Preprocedure Evaluation (Signed)
Anesthesia Evaluation  Patient identified by MRN, date of birth, ID band Patient awake    Reviewed: Allergy & Precautions, NPO status , Patient's Chart, lab work & pertinent test results  History of Anesthesia Complications Negative for: history of anesthetic complications  Airway Mallampati: I   Neck ROM: Full    Dental no notable dental hx.    Pulmonary neg pulmonary ROS   Pulmonary exam normal breath sounds clear to auscultation       Cardiovascular Exercise Tolerance: Good negative cardio ROS Normal cardiovascular exam Rhythm:Regular Rate:Normal     Neuro/Psych  PSYCHIATRIC DISORDERS (Autism Spectrum Disorder) Anxiety Depression Bipolar Disorder   negative neurological ROS     GI/Hepatic negative GI ROS,,,  Endo/Other  negative endocrine ROS    Renal/GU negative Renal ROS     Musculoskeletal   Abdominal   Peds  Hematology  (+) Blood dyscrasia (MGUS; thrombocytopenia)   Anesthesia Other Findings   Reproductive/Obstetrics                             Anesthesia Physical Anesthesia Plan  ASA: 2  Anesthesia Plan: General   Post-op Pain Management:    Induction: Intravenous  PONV Risk Score and Plan: 2 and Propofol infusion, TIVA and Treatment may vary due to age or medical condition  Airway Management Planned: Natural Airway  Additional Equipment:   Intra-op Plan:   Post-operative Plan:   Informed Consent: I have reviewed the patients History and Physical, chart, labs and discussed the procedure including the risks, benefits and alternatives for the proposed anesthesia with the patient or authorized representative who has indicated his/her understanding and acceptance.       Plan Discussed with: CRNA  Anesthesia Plan Comments: (LMA/GETA backup discussed.  Patient consented for risks of anesthesia including but not limited to:  - adverse reactions to medications -  damage to eyes, teeth, lips or other oral mucosa - nerve damage due to positioning  - sore throat or hoarseness - damage to heart, brain, nerves, lungs, other parts of body or loss of life  Informed patient about role of CRNA in peri- and intra-operative care.  Patient voiced understanding.)       Anesthesia Quick Evaluation

## 2023-05-20 NOTE — Op Note (Signed)
[facility] Gastroenterology Patient Name: Steven Knight Procedure Date: 05/20/2023 9:41 AM MRN: [id number] Account #: [id number] Date of Birth: Apr 07, 1971 Admit Type: Outpatient Age: 52 Room: [facility] ENDO ROOM 1 Gender: Male Note Status: Finalized Instrument Name: Prentice Docker [id number] Procedure:             Colonoscopy Indications:           Screening for colorectal malignant neoplasm Providers:             Midge Minium MD, MD Referring MD:          Alfredia Ferguson (Referring MD) Medicines:             Propofol per Anesthesia Complications:         No immediate complications. Procedure:             Pre-Anesthesia Assessment:                        - Prior to the procedure, a History and Physical was                         performed, and patient medications and allergies were                         reviewed. The patient's tolerance of previous                         anesthesia was also reviewed. The risks and benefits                         of the procedure and the sedation options and risks                         were discussed with the patient. All questions were                         answered, and informed consent was obtained. Prior                         Anticoagulants: The patient has taken no anticoagulant                         or antiplatelet agents. ASA Grade Assessment: II - A                         patient with mild systemic disease. After reviewing                         the risks and benefits, the patient was deemed in                         satisfactory condition to undergo the procedure.                        After obtaining informed consent, the colonoscope was                         passed under direct vision. Throughout the procedure,  the patient's blood pressure, pulse, and oxygen                         saturations were monitored continuously. The                         Colonoscope was introduced through  the anus and                         advanced to the the cecum, identified by appendiceal                         orifice and ileocecal valve. The colonoscopy was                         performed without difficulty. The patient tolerated                         the procedure well. The quality of the bowel                         preparation was excellent. Findings:      The perianal and digital rectal examinations were normal.      Non-bleeding internal hemorrhoids were found during retroflexion. The       hemorrhoids were Grade I (internal hemorrhoids that do not prolapse).      The exam was otherwise without abnormality. Impression:            - Non-bleeding internal hemorrhoids.                        - The examination was otherwise normal.                        - No specimens collected. Recommendation:        - Discharge patient to home.                        - Resume previous diet.                        - Continue present medications.                        - Repeat colonoscopy in 10 years for screening                         purposes. Procedure Code(s):     --- Professional ---                        [phone], Colonoscopy, flexible; diagnostic, including                         collection of specimen(s) by brushing or washing, when                         performed (separate procedure) Diagnosis Code(s):     --- Professional ---                        Z12.11, Encounter for  screening for malignant neoplasm                         of colon CPT copyright 2022 American [employer] Association. All rights reserved. The codes documented in this report are preliminary and upon coder review may  be revised to meet current compliance requirements. Midge Minium MD, MD 05/20/2023 10:07:18 AM This report has been signed electronically. Number of Addenda: 0 Note Initiated On: 05/20/2023 9:41 AM Scope Withdrawal Time: 0 hours 7 minutes 20 seconds  Total Procedure Duration: 0 hours 14 minutes 59  seconds  Estimated Blood Loss:  Estimated blood loss: none.      [facility]

## 2023-05-20 NOTE — Transfer of Care (Signed)
Immediate Anesthesia Transfer of Care Note  Patient: Steven Knight  Procedure(s) Performed: Procedure(s): COLONOSCOPY WITH PROPOFOL (N/A)  Patient Location: PACU and Endoscopy Unit  Anesthesia Type:General  Level of Consciousness: sedated  Airway & Oxygen Therapy: Patient Spontanous Breathing and Patient connected to nasal cannula oxygen  Post-op Assessment: Report given to RN and Post -op Vital signs reviewed and stable  Post vital signs: Reviewed and stable  Last Vitals:  Vitals:   05/20/23 0915  BP: 114/84  Pulse: 70  Resp: 20  Temp: (!) 35.7 C  SpO2: 100%    Complications: No apparent anesthesia complications

## 2023-05-20 NOTE — H&P (Signed)
Steven Minium, MD [facility] [address] [place], [place] [address] Phone: [phone] Fax : [phone]  Primary Care Physician:  Alfredia Ferguson, PA-C Primary Gastroenterologist:  Dr. Servando Snare  Pre-Procedure History & Physical: HPI:  FELIBERTO Knight is a 52 y.o. male is here for a screening colonoscopy.   Past Medical History:  Diagnosis Date   Anxiety    Autism    Autism    Bipolar 1 disorder (HCC)    Depression     Past Surgical History:  Procedure Laterality Date   EYE SURGERY      Prior to Admission medications   Medication Sig Start Date End Date Taking? Authorizing Provider  ARIPiprazole (ABILIFY) 5 MG tablet Take by mouth. 04/29/23  Yes [provider]  lamoTRIgine (LAMICTAL) 200 MG tablet Take by mouth. 04/29/23  Yes [provider]  LORazepam (ATIVAN) 0.5 MG tablet Take 1 tablet (0.5 mg total) by mouth every 8 (eight) hours as needed for anxiety. 09/24/20  Yes Margaretann Loveless, PA-C  mirtazapine (REMERON) 30 MG tablet Take 1 tablet (30 mg total) by mouth at bedtime. 09/24/20  Yes Margaretann Loveless, PA-C  propranolol (INDERAL) 10 MG tablet Take 1 tablet (10 mg total) by mouth 2 (two) times daily. 09/24/20  Yes Joycelyn Man M, PA-C  ARIPiprazole (ABILIFY) 10 MG tablet Take 5 mg by mouth daily. Patient not taking: Reported on 05/07/2023 04/14/23   [provider]  ARIPiprazole (ABILIFY) 20 MG tablet Take 1 tablet (20 mg total) by mouth daily. Patient not taking: Reported on 05/07/2023 07/09/18   Pucilowska, Braulio Conte B, MD  mirtazapine (REMERON) 30 MG tablet Take by mouth. Patient not taking: Reported on 05/07/2023 09/05/22 09/05/23  [provider]    Allergies as of 05/08/2023   (No Known Allergies)    Family History  Problem Relation Age of Onset   Diabetes Mother    Dementia Paternal Grandfather     Social History   Socioeconomic History   Marital status: Married    Spouse name: Not on file   Number of  children: Not on file   Years of education: Not on file   Highest education level: Not on file  Occupational History   Not on file  Tobacco Use   Smoking status: Never   Smokeless tobacco: Never  Vaping Use   Vaping Use: Never used  Substance and Sexual Activity   Alcohol use: Never   Drug use: Never   Sexual activity: Yes  Other Topics Concern   Not on file  Social History Narrative   Not on file   Social Determinants of Health   Financial Resource Strain: Not on file  Food Insecurity: Not on file  Transportation Needs: Not on file  Physical Activity: Not on file  Stress: Not on file  Social Connections: Not on file  Intimate Partner Violence: Not on file    Review of Systems: See HPI, otherwise negative ROS  Physical Exam: BP 114/84   Pulse 70   Temp (!) 96.3 F (35.7 C) (Temporal)   Resp 20   Ht 6\' 2"  (1.88 m)   Wt 68.5 kg   SpO2 100%   BMI 19.39 kg/m  General:   Alert,  pleasant and cooperative in NAD Head:  Normocephalic and atraumatic. Neck:  Supple; no masses or thyromegaly. Lungs:  Clear throughout to auscultation.    Heart:  Regular rate and rhythm. Abdomen:  Soft, nontender and nondistended. Normal bowel sounds, without guarding, and  without rebound.   Neurologic:  Alert and  oriented x4;  grossly normal neurologically.  Impression/Plan: [name] is now here to undergo a screening colonoscopy.  Risks, benefits, and alternatives regarding colonoscopy have been reviewed with the patient.  Questions have been answered.  All parties agreeable.

## 2023-05-20 NOTE — Anesthesia Postprocedure Evaluation (Signed)
Anesthesia Post Note  Patient: Steven Knight  Procedure(s) Performed: COLONOSCOPY WITH PROPOFOL  Patient location during evaluation: PACU Anesthesia Type: General Level of consciousness: awake and alert, oriented and patient cooperative Pain management: pain level controlled Vital Signs Assessment: post-procedure vital signs reviewed and stable Respiratory status: spontaneous breathing, nonlabored ventilation and respiratory function stable Cardiovascular status: blood pressure returned to baseline and stable Postop Assessment: adequate PO intake Anesthetic complications: no   No notable events documented.   Last Vitals:  Vitals:   05/20/23 1019 05/20/23 1029  BP: 109/72 107/78  Pulse:    Resp: 17 17  Temp:    SpO2:      Last Pain:  Vitals:   05/20/23 1029  TempSrc:   PainSc: 0-No pain                 Reed Breech

## 2023-05-21 ENCOUNTER — Encounter: Payer: Self-pay | Admitting: Gastroenterology

## 2023-07-27 ENCOUNTER — Inpatient Hospital Stay: Payer: Medicare HMO | Attending: Oncology

## 2023-07-27 DIAGNOSIS — R161 Splenomegaly, not elsewhere classified: Secondary | ICD-10-CM | POA: Insufficient documentation

## 2023-07-27 DIAGNOSIS — D696 Thrombocytopenia, unspecified: Secondary | ICD-10-CM | POA: Diagnosis present

## 2023-07-27 DIAGNOSIS — D479 Neoplasm of uncertain behavior of lymphoid, hematopoietic and related tissue, unspecified: Secondary | ICD-10-CM | POA: Diagnosis present

## 2023-07-27 DIAGNOSIS — D472 Monoclonal gammopathy: Secondary | ICD-10-CM | POA: Diagnosis not present

## 2023-07-27 LAB — CBC WITH DIFFERENTIAL/PLATELET
Abs Immature Granulocytes: 0.19 10*3/uL — ABNORMAL HIGH (ref 0.00–0.07)
Basophils Absolute: 0.1 10*3/uL (ref 0.0–0.1)
Basophils Relative: 2 %
Eosinophils Absolute: 0.1 10*3/uL (ref 0.0–0.5)
Eosinophils Relative: 1 %
HCT: 38 % — ABNORMAL LOW (ref 39.0–52.0)
Hemoglobin: 12.5 g/dL — ABNORMAL LOW (ref 13.0–17.0)
Immature Granulocytes: 3 %
Lymphocytes Relative: 34 %
Lymphs Abs: 2.5 10*3/uL (ref 0.7–4.0)
MCH: 27.8 pg (ref 26.0–34.0)
MCHC: 32.9 g/dL (ref 30.0–36.0)
MCV: 84.4 fL (ref 80.0–100.0)
Monocytes Absolute: 0.6 10*3/uL (ref 0.1–1.0)
Monocytes Relative: 8 %
Neutro Abs: 3.9 10*3/uL (ref 1.7–7.7)
Neutrophils Relative %: 52 %
Platelets: 61 10*3/uL — ABNORMAL LOW (ref 150–400)
RBC: 4.5 MIL/uL (ref 4.22–5.81)
RDW: 13 % (ref 11.5–15.5)
WBC: 7.3 10*3/uL (ref 4.0–10.5)
nRBC: 0 % (ref 0.0–0.2)

## 2023-07-27 LAB — COMPREHENSIVE METABOLIC PANEL
ALT: 19 U/L (ref 0–44)
AST: 16 U/L (ref 15–41)
Albumin: 4.1 g/dL (ref 3.5–5.0)
Alkaline Phosphatase: 46 U/L (ref 38–126)
Anion gap: 5 (ref 5–15)
BUN: 19 mg/dL (ref 6–20)
CO2: 27 mmol/L (ref 22–32)
Calcium: 8.7 mg/dL — ABNORMAL LOW (ref 8.9–10.3)
Chloride: 103 mmol/L (ref 98–111)
Creatinine, Ser: 0.9 mg/dL (ref 0.61–1.24)
GFR, Estimated: >60 mL/min (ref >60)
Glucose, Bld: 92 mg/dL (ref 70–99)
Potassium: 4.1 mmol/L (ref 3.5–5.1)
Sodium: 135 mmol/L (ref 135–145)
Total Bilirubin: 1.5 mg/dL — ABNORMAL HIGH (ref 0.3–1.2)
Total Protein: 6.5 g/dL (ref 6.5–8.1)

## 2023-07-27 LAB — LACTATE DEHYDROGENASE: LDH: 126 U/L (ref 98–192)

## 2023-07-27 LAB — IMMATURE PLATELET FRACTION: Immature Platelet Fraction: 14.3 % — ABNORMAL HIGH (ref 1.2–8.6)

## 2023-07-28 ENCOUNTER — Other Ambulatory Visit: Payer: Medicare HMO

## 2023-07-28 LAB — KAPPA/LAMBDA LIGHT CHAINS
Kappa free light chain: 24.1 mg/L — ABNORMAL HIGH (ref 3.3–19.4)
Kappa, lambda light chain ratio: 1.44 (ref 0.26–1.65)
Lambda free light chains: 16.7 mg/L (ref 5.7–26.3)

## 2023-07-31 LAB — COMP PANEL: LEUKEMIA/LYMPHOMA

## 2023-08-03 LAB — MULTIPLE MYELOMA PANEL, SERUM
Albumin SerPl Elph-Mcnc: 3.8 g/dL (ref 2.9–4.4)
Albumin/Glob SerPl: 1.7 (ref 0.7–1.7)
Alpha 1: 0.1 g/dL (ref 0.0–0.4)
Alpha2 Glob SerPl Elph-Mcnc: 0.4 g/dL (ref 0.4–1.0)
B-Globulin SerPl Elph-Mcnc: 0.7 g/dL (ref 0.7–1.3)
Gamma Glob SerPl Elph-Mcnc: 1 g/dL (ref 0.4–1.8)
Globulin, Total: 2.3 g/dL (ref 2.2–3.9)
IgA: 96 mg/dL (ref 90–386)
IgG (Immunoglobin G), Serum: 909 mg/dL (ref 603–1613)
IgM (Immunoglobulin M), Srm: 224 mg/dL — ABNORMAL HIGH (ref 20–172)
M Protein SerPl Elph-Mcnc: 0.2 g/dL — ABNORMAL HIGH
Total Protein ELP: 6.1 g/dL (ref 6.0–8.5)

## 2023-08-06 ENCOUNTER — Inpatient Hospital Stay (HOSPITAL_BASED_OUTPATIENT_CLINIC_OR_DEPARTMENT_OTHER): Payer: Medicare HMO | Admitting: Oncology

## 2023-08-06 ENCOUNTER — Encounter: Payer: Self-pay | Admitting: Oncology

## 2023-08-06 DIAGNOSIS — C8510 Unspecified B-cell lymphoma, unspecified site: Secondary | ICD-10-CM | POA: Diagnosis not present

## 2023-08-06 DIAGNOSIS — R634 Abnormal weight loss: Secondary | ICD-10-CM

## 2023-08-06 DIAGNOSIS — D479 Neoplasm of uncertain behavior of lymphoid, hematopoietic and related tissue, unspecified: Secondary | ICD-10-CM

## 2023-08-06 DIAGNOSIS — D696 Thrombocytopenia, unspecified: Secondary | ICD-10-CM | POA: Diagnosis not present

## 2023-08-06 DIAGNOSIS — D472 Monoclonal gammopathy: Secondary | ICD-10-CM | POA: Diagnosis not present

## 2023-08-06 DIAGNOSIS — R161 Splenomegaly, not elsewhere classified: Secondary | ICD-10-CM

## 2023-08-06 NOTE — Progress Notes (Addendum)
HEMATOLOGY-ONCOLOGY TeleHEALTH VISIT PROGRESS NOTE  I connected with Steven Knight on 08/06/2023  @ at 11:30 AM EDT by video enabled telemedicine visit and verified that I am speaking with the correct person using two identifiers. I discussed the limitations, risks, security and privacy concerns of performing an evaluation and management service by telemedicine and the availability of in-person appointments. The patient expressed understanding and agreed to proceed.   Other persons participating in the visit and their role in the encounter:  Wife   Patient's location: Home  Provider's location: office Chief Complaint: Follow-up for thrombocytopenia   INTERVAL HISTORY Steven Knight is a 52 y.o. male who has above history reviewed by me today presents for follow up visit for management of thrombocytopenia, B-cell lymphoproliferative disorder Patient reports feeling well.  Denies any easy bruising or bleeding events.  Appetite is good.  No unintentional weight loss, night sweats, fever.  Review of Systems  Unable to perform ROS: Other (Psychiatry disorder)  Constitutional:  Negative for fatigue.  Hematological:        No acute bleeding events    Past Medical History:  Diagnosis Date   Anxiety    Autism    Autism    Bipolar 1 disorder (HCC)    Depression    Past Surgical History:  Procedure Laterality Date   COLONOSCOPY WITH PROPOFOL N/A 05/20/2023   Procedure: COLONOSCOPY WITH PROPOFOL;  Surgeon: Midge Minium, MD;  Location: [facility] ENDOSCOPY;  Service: Endoscopy;  Laterality: N/A;   EYE SURGERY      Family History  Problem Relation Age of Onset   Diabetes Mother    Dementia Paternal Grandfather     Social History   Socioeconomic History   Marital status: Married    Spouse name: Not on file   Number of children: Not on file   Years of education: Not on file   Highest education level: Not on file  Occupational History   Not on file  Tobacco Use   Smoking  status: Never   Smokeless tobacco: Never  Vaping Use   Vaping status: Never Used  Substance and Sexual Activity   Alcohol use: Never   Drug use: Never   Sexual activity: Yes  Other Topics Concern   Not on file  Social History Narrative   Not on file   Social Determinants of Health   Financial Resource Strain: Not on file  Food Insecurity: Not on file  Transportation Needs: Not on file  Physical Activity: Not on file  Stress: Not on file  Social Connections: Unknown (04/22/2022)   Received from [facility]   Social Network    Social Network: Not on file  Intimate Partner Violence: Unknown (03/13/2022)   Received from [facility]   HITS    Physically Hurt: Not on file    Insult or Talk Down To: Not on file    Threaten Physical Harm: Not on file    Scream or Curse: Not on file    Current Outpatient Medications on File Prior to Visit  Medication Sig Dispense Refill   ARIPiprazole (ABILIFY) 10 MG tablet Take 5 mg by mouth daily.     lamoTRIgine (LAMICTAL) 200 MG tablet Take by mouth.     LORazepam (ATIVAN) 0.5 MG tablet Take 1 tablet (0.5 mg total) by mouth every 8 (eight) hours as needed for anxiety. 90 tablet 1   propranolol (INDERAL) 10 MG tablet Take 1 tablet (10 mg total) by mouth 2 (two) times daily.  ARIPiprazole (ABILIFY) 20 MG tablet Take 1 tablet (20 mg total) by mouth daily. (Patient not taking: Reported on 08/06/2023) 30 tablet 1   ARIPiprazole (ABILIFY) 5 MG tablet Take by mouth. (Patient not taking: Reported on 08/06/2023)     mirtazapine (REMERON) 30 MG tablet Take 1 tablet (30 mg total) by mouth at bedtime. (Patient not taking: Reported on 08/06/2023) 90 tablet 1   mirtazapine (REMERON) 30 MG tablet Take by mouth. (Patient not taking: Reported on 05/07/2023)     No current facility-administered medications on file prior to visit.    No Known Allergies     Observations/Objective: There were no vitals filed for this visit.  There is no height or weight on  file to calculate BMI.  Physical Exam Neurological:     Mental Status: He is alert.     CBC    Component Value Date/Time   WBC 7.3 07/27/2023 1105   RBC 4.50 07/27/2023 1105   HGB 12.5 (L) 07/27/2023 1105   HGB 14.1 09/24/2020 1420   HCT 38.0 (L) 07/27/2023 1105   HCT 41.3 09/24/2020 1420   PLT 61 (L) 07/27/2023 1105   PLT 14 (LL) 09/24/2020 1420   MCV 84.4 07/27/2023 1105   MCV 82 09/24/2020 1420   MCH 27.8 07/27/2023 1105   MCHC 32.9 07/27/2023 1105   RDW 13.0 07/27/2023 1105   RDW 12.7 09/24/2020 1420   LYMPHSABS 2.5 07/27/2023 1105   LYMPHSABS 2.9 09/24/2020 1420   MONOABS 0.6 07/27/2023 1105   EOSABS 0.1 07/27/2023 1105   EOSABS 0.1 09/24/2020 1420   BASOSABS 0.1 07/27/2023 1105   BASOSABS 0.1 09/24/2020 1420    CMP     Component Value Date/Time   NA 135 07/27/2023 1105   NA 144 09/24/2020 1420   K 4.1 07/27/2023 1105   CL 103 07/27/2023 1105   CO2 27 07/27/2023 1105   GLUCOSE 92 07/27/2023 1105   BUN 19 07/27/2023 1105   BUN 13 09/24/2020 1420   CREATININE 0.90 07/27/2023 1105   CALCIUM 8.7 (L) 07/27/2023 1105   PROT 6.5 07/27/2023 1105   PROT 7.0 09/24/2020 1420   ALBUMIN 4.1 07/27/2023 1105   ALBUMIN 4.5 09/24/2020 1420   AST 16 07/27/2023 1105   ALT 19 07/27/2023 1105   ALKPHOS 46 07/27/2023 1105   BILITOT 1.5 (H) 07/27/2023 1105   BILITOT 0.6 09/24/2020 1420   GFRNONAA >60 07/27/2023 1105   GFRAA 121 09/24/2020 1420       ASSESSMENT & PLAN:   B-cell lymphoproliferative disorder (HCC) IgM MGUS, monoclonal lymphocytosis, mild splenomegaly. Status post weekly rituximab x 4   Patient is clinically doing well. Continue observation.    Thrombocytopenia (HCC) #Thrombocytopenia, due to peripheral destruction/sequestion or consumption.  primary ITP versus secondary ITP related to underlying B-cell lymphoproliferative disorder.  See below. Previous bone marrow biopsy indicates underlying B-cell lymphoproliferative disorder. + B-cell  rearrangement, - MYD88, - t(11,18) Cytogenetics is normal.  Labs are reviewed and discussed with patient.  Slightly decreased platelet count compared to his baseline. Continue observation.   Splenomegaly Normal spleen size on recent US  MGUS (monoclonal gammopathy of unknown significance) Stable M protein. Observation.   Weight loss Unknown etiology.  Recommend patient to monitor weight at home.  Today he request to have virtual visit so weight cannot be officially obtained.  He will have a follow-up appointment in person in 3 months will recheck his weight..    Orders Placed This Encounter  Procedures   CBC with  Differential (Cancer Center Only)    Standing Status:   Future    Standing Expiration Date:   08/05/2024   CMP (Cancer Center only)    Standing Status:   Future    Standing Expiration Date:   08/05/2024   Lactate dehydrogenase    Standing Status:   Future    Standing Expiration Date:   08/05/2024   Vitamin B12    Standing Status:   Future    Standing Expiration Date:   08/05/2024   Folate    Standing Status:   Future    Standing Expiration Date:   08/05/2024   TSH    Standing Status:   Future    Standing Expiration Date:   08/05/2024   Immature Platelet Fraction    Standing Status:   Future    Standing Expiration Date:   08/05/2024   Technologist smear review    Standing Status:   Future    Standing Expiration Date:   08/05/2024    Order Specific Question:   Clinical information:    Answer:   B-cell lymphoproliferative disorder   Follow-up in 3 months lab md 6 months.    I discussed the assessment and treatment plan with the patient. The patient was provided an opportunity to ask questions and all were answered. The patient agreed with the plan and demonstrated an understanding of the instructions.  The patient was advised to call back or seek an in-person evaluation if the symptoms worsen or if the condition fails to improve as anticipated.    Rickard Patience, MD  08/06/2023 7:20 PM

## 2023-08-06 NOTE — Assessment & Plan Note (Signed)
Unknown etiology.  Recommend patient to monitor weight at home.  Today he request to have virtual visit so weight cannot be officially obtained.  He will have a follow-up appointment in person in 3 months will recheck his weight.Marland Kitchen

## 2023-08-06 NOTE — Assessment & Plan Note (Signed)
IgM MGUS, monoclonal lymphocytosis, mild splenomegaly. Status post weekly rituximab x 4   Patient is clinically doing well. Continue observation.

## 2023-08-06 NOTE — Assessment & Plan Note (Signed)
Stable M protein. Observation.

## 2023-08-06 NOTE — Assessment & Plan Note (Signed)
#  Thrombocytopenia, due to peripheral destruction/sequestion or consumption.  primary ITP versus secondary ITP related to underlying B-cell lymphoproliferative disorder.  See below. Previous bone marrow biopsy indicates underlying B-cell lymphoproliferative disorder. + B-cell rearrangement, - MYD88, - t(11,18) Cytogenetics is normal.  Labs are reviewed and discussed with patient.  Slightly decreased platelet count compared to his baseline. Continue observation.

## 2023-08-06 NOTE — Assessment & Plan Note (Signed)
Normal spleen size on recent US

## 2023-09-07 ENCOUNTER — Ambulatory Visit: Payer: Medicare HMO

## 2023-09-07 VITALS — Ht 73.0 in | Wt 156.0 lb

## 2023-09-07 DIAGNOSIS — C801 Malignant (primary) neoplasm, unspecified: Secondary | ICD-10-CM | POA: Insufficient documentation

## 2023-09-07 DIAGNOSIS — Z0000 Encounter for general adult medical examination without abnormal findings: Secondary | ICD-10-CM | POA: Diagnosis not present

## 2023-09-07 NOTE — Patient Instructions (Signed)
Steven Knight , Thank you for taking time to come for your Medicare Wellness Visit. I appreciate your ongoing commitment to your health goals. Please review the following plan we discussed and let me know if I can assist you in the future.   Referrals/Orders/Follow-Ups/Clinician Recommendations: none  This is a list of the screening recommended for you and due dates:  Health Maintenance  Topic Date Due   Medicare Annual Wellness Visit  Never done   DTaP/Tdap/Td vaccine (1 - Tdap) Never done   Zoster (Shingles) Vaccine (1 of 2) Never done   Flu Shot  07/09/2023   COVID-19 Vaccine (7 - 2023-24 season) 08/09/2023   Colon Cancer Screening  05/19/2033   Hepatitis C Screening  Completed   HIV Screening  Completed   HPV Vaccine  Aged Out   Cologuard (Stool DNA test)  Discontinued    Advanced directives: (Copy Requested) Please bring a copy of your health care power of attorney and living will to the office to be added to your chart at your convenience.  Next Medicare Annual Wellness Visit scheduled for next year:  09/07/24 @ 8:15am telephone

## 2023-09-07 NOTE — Progress Notes (Cosign Needed Addendum)
Subjective:   Steven Knight is a 52 y.o. male who presents for an Initial Medicare Annual Wellness Visit.  Visit Complete: Virtual  I connected with  Steven Knight on 09/07/23 by a audio enabled telemedicine application and verified that I am speaking with the correct person using two identifiers.  Patient Location: Home  Provider Location: Office/Clinic  I discussed the limitations of evaluation and management by telemedicine. The patient expressed understanding and agreed to proceed.  Because this visit was a virtual/telehealth visit, some criteria may be missing or patient reported. Any vitals not documented were not able to be obtained and vitals that have been documented are patient reported.   Patient Medicare AWV questionnaire was completed by the patient on 09/04/23; I have confirmed that all information answered by patient is correct and no changes since this date.  Cardiac Risk Factors include: male gender;sedentary lifestyle     Objective:    Today's Vitals   09/07/23 0821  Weight: 156 lb (70.8 kg)  Height: 6\' 1"  (1.854 m)   Body mass index is 20.58 kg/m.     09/07/2023    8:31 AM 05/20/2023    9:13 AM 05/07/2022    2:37 PM 11/05/2021   10:41 AM 07/25/2021    2:03 PM 04/29/2021    1:18 PM 01/30/2021    9:54 AM  Advanced Directives  Does Patient Have a Medical Advance Directive? Yes Yes No Yes Yes Yes Yes  Type of Estate agent of [place];Living will Healthcare Power of [place];Living will  Healthcare Power of [place];Living will Healthcare Power of [employer] Power of [employer] Power of Attorney  Does patient want to make changes to medical advance directive?     No - Patient declined      Current Medications (verified) Outpatient Encounter Medications as of 09/07/2023  Medication Sig   ARIPiprazole (ABILIFY) 10 MG tablet Take 5 mg by mouth daily.   LORazepam (ATIVAN) 0.5 MG tablet Take 1 tablet (0.5  mg total) by mouth every 8 (eight) hours as needed for anxiety.   propranolol (INDERAL) 10 MG tablet Take 1 tablet (10 mg total) by mouth 2 (two) times daily.   ARIPiprazole (ABILIFY) 20 MG tablet Take 1 tablet (20 mg total) by mouth daily. (Patient not taking: Reported on 08/06/2023)   ARIPiprazole (ABILIFY) 5 MG tablet Take by mouth. (Patient not taking: Reported on 08/06/2023)   lamoTRIgine (LAMICTAL) 200 MG tablet Take by mouth.   mirtazapine (REMERON) 30 MG tablet Take 1 tablet (30 mg total) by mouth at bedtime. (Patient not taking: Reported on 08/06/2023)   mirtazapine (REMERON) 30 MG tablet Take by mouth. (Patient not taking: Reported on 05/07/2023)   No facility-administered encounter medications on file as of 09/07/2023.    Allergies (verified) Patient has no known allergies.   History: Past Medical History:  Diagnosis Date   Anxiety    Autism    Autism    Bipolar 1 disorder (HCC)    Cancer (HCC)    B-cell Lymphoma [facility]   Depression    Past Surgical History:  Procedure Laterality Date   COLONOSCOPY WITH PROPOFOL N/A 05/20/2023   Procedure: COLONOSCOPY WITH PROPOFOL;  Surgeon: Midge Minium, MD;  Location[facility] ENDOSCOPY;  Service: Endoscopy;  Laterality: N/A;   EYE SURGERY     Family History  Problem Relation Age of Onset   Diabetes Mother    Dementia Paternal Grandfather    Social History   Socioeconomic  History   Marital status: Married    Spouse name: Not on file   Number of children: Not on file   Years of education: Not on file   Highest education level: Not on file  Occupational History   Not on file  Tobacco Use   Smoking status: Never   Smokeless tobacco: Never  Vaping Use   Vaping status: Never Used  Substance and Sexual Activity   Alcohol use: Never   Drug use: Never   Sexual activity: Yes    Birth control/protection: None  Other Topics Concern   Not on file  Social History Narrative   Not on file   Social Determinants of  Health   Financial Resource Strain: Low Risk  (09/04/2023)   Overall Financial Resource Strain (CARDIA)    Difficulty of Paying Living Expenses: Not hard at all  Food Insecurity: No Food Insecurity (09/04/2023)   Hunger Vital Sign    Worried About Running Out of Food in the Last Year: Never true    Ran Out of Food in the Last Year: Never true  Transportation Needs: No Transportation Needs (09/04/2023)   PRAPARE - Administrator, Civil Service (Medical): No    Lack of Transportation (Non-Medical): No  Physical Activity: Inactive (09/04/2023)   Exercise Vital Sign    Days of Exercise per Week: 0 days    Minutes of Exercise per Session: 0 min  Stress: Stress Concern Present (09/04/2023)   [employer] of Occupational Health - Occupational Stress Questionnaire    Feeling of Stress : Rather much  Social Connections: Unknown (09/04/2023)   Social Connection and Isolation Panel [NHANES]    Frequency of Communication with Friends and Family: Once a week    Frequency of Social Gatherings with Friends and Family: Once a week    Attends Religious Services: Not on Marketing executive or Organizations: No    Attends Banker Meetings: Never    Marital Status: Married    Tobacco Counseling Counseling given: Not Answered   Clinical Intake:  Pre-visit preparation completed: Yes  Pain : No/denies pain     BMI - recorded: 20.58 Nutritional Status: BMI of 19-24  Normal Nutritional Risks: None Diabetes: No  How often do you need to have someone help you when you read instructions, pamphlets, or other written materials from your doctor or pharmacy?: 3 - Sometimes  Interpreter Needed?: No  Comments: lives with wife Information entered by :: B.Fredda Clarida,LPN   Activities of Daily Living    09/04/2023   12:01 PM 11/26/2022    8:33 AM  In your present state of health, do you have any difficulty performing the following activities:  Hearing? 0 0   Vision? 0 0  Difficulty concentrating or making decisions? 1 1  Walking or climbing stairs? 0 0  Dressing or bathing? 1 0  Doing errands, shopping? 1 1  Preparing Food and eating ? Y   Using the Toilet? Y   In the past six months, have you accidently leaked urine? Y   Do you have problems with loss of bowel control? Y   Managing your Medications? Y   Managing your Finances? Y   Housekeeping or managing your Housekeeping? Y     Patient Care Team: Rickard Patience, MD as Consulting Physician (Hematology and Oncology) Sherlyn Hay, DO as Consulting Physician (Family Medicine)  Indicate any recent Medical Services you may have received from other than [facility] providers  in the past year (date may be approximate).     Assessment:   This is a routine wellness examination for [place].  Hearing/Vision screen Hearing Screening - Comments:: Pt says he has no problems with his hearing Vision Screening - Comments:: Pt says he has glasses and sees well [facility]   Goals Addressed   None    Depression Screen    09/07/2023    8:28 AM 11/26/2022    8:33 AM 09/24/2020    1:25 PM  PHQ 2/9 Scores  PHQ - 2 Score 4 3 6   PHQ- 9 Score 7 8 19     Fall Risk    09/04/2023   12:01 PM 11/26/2022    8:53 AM 11/26/2022    8:33 AM  Fall Risk   Falls in the past year? 0 0 0  Number falls in past yr: 0 0 0  Injury with Fall? 0 0 0  Risk for fall due to : No Fall Risks  No Fall Risks  Follow up Education provided;Falls prevention discussed  Falls evaluation completed    MEDICARE RISK AT HOME: Medicare Risk at Home Any stairs in or around the home?: No If so, are there any without handrails?: No Home free of loose throw rugs in walkways, pet beds, electrical cords, etc?: Yes Adequate lighting in your home to reduce risk of falls?: Yes Life alert?: No Use of a cane, walker or w/c?: No Grab bars in the bathroom?: No Shower chair or bench in shower?: No Elevated toilet seat or a handicapped  toilet?: No  TIMED UP AND GO:  Was the test performed? No    Cognitive Function:        09/07/2023    8:33 AM  6CIT Screen  What Year? 0 points  What month? 0 points  What time? 0 points  Count back from 20 0 points  Months in reverse 0 points  Repeat phrase 0 points  Total Score 0 points    Immunizations Immunization History  Administered Date(s) Administered   Influenza,inj,Quad PF,6+ Mos 08/05/2022   Influenza-Unspecified 09/02/2018, 08/22/2020   Moderna Covid-19 Vaccine Bivalent Booster 50yrs & up 09/06/2021   PFIZER(Purple Top)SARS-COV-2 Vaccination 02/09/2020, 03/01/2020, 09/23/2020   Pfizer Covid-19 Vaccine Bivalent Booster 79yrs & up 05/29/2021   Pfizer(Comirnaty)Fall Seasonal Vaccine 12 years and older 09/05/2022    TDAP status: Due, Education has been provided regarding the importance of this vaccine. Advised may receive this vaccine at local pharmacy or Health Dept. Aware to provide a copy of the vaccination record if obtained from local pharmacy or Health Dept. Verbalized acceptance and understanding.  Flu Vaccine status: Due, Education has been provided regarding the importance of this vaccine. Advised may receive this vaccine at local pharmacy or Health Dept. Aware to provide a copy of the vaccination record if obtained from local pharmacy or Health Dept. Verbalized acceptance and understanding.   Covid-19 vaccine status: Completed vaccines  Qualifies for Shingles Vaccine? Yes   Zostavax completed No   Shingrix Completed?: No.    Education has been provided regarding the importance of this vaccine. Patient has been advised to call insurance company to determine out of pocket expense if they have not yet received this vaccine. Advised may also receive vaccine at local pharmacy or Health Dept. Verbalized acceptance and understanding.  Screening Tests Health Maintenance  Topic Date Due   Medicare Annual Wellness (AWV)  Never done   DTaP/Tdap/Td (1 - Tdap)  Never done   Zoster Vaccines- Shingrix (  1 of 2) Never done   INFLUENZA VACCINE  07/09/2023   COVID-19 Vaccine (7 - 2023-24 season) 08/09/2023   Colonoscopy  05/19/2033   Hepatitis C Screening  Completed   HIV Screening  Completed   HPV VACCINES  Aged Out   Fecal DNA (Cologuard)  Discontinued    Health Maintenance  Health Maintenance Due  Topic Date Due   Medicare Annual Wellness (AWV)  Never done   DTaP/Tdap/Td (1 - Tdap) Never done   Zoster Vaccines- Shingrix (1 of 2) Never done   INFLUENZA VACCINE  07/09/2023   COVID-19 Vaccine (7 - 2023-24 season) 08/09/2023    Colorectal cancer screening: Type of screening: Colonoscopy. Completed yes. Repeat every 10 years  Lung Cancer Screening: (Low Dose CT Chest recommended if Age 64-80 years, 20 pack-year currently smoking OR have quit w/in 15years.) does not qualify.   Lung Cancer Screening Referral: yes  Additional Screening:  Hepatitis C Screening: does not qualify; Completed yes  Vision Screening: Recommended annual ophthalmology exams for early detection of glaucoma and other disorders of the eye. Is the patient up to date with their annual eye exam?  Yes  Who is the provider or what is the name of the office in which the patient attends annual eye exams? [facility] If pt is not established with a provider, would they like to be referred to a provider to establish care? No .   Dental Screening: Recommended annual dental exams for proper oral hygiene  Diabetic Foot Exam: n/a  Community Resource Referral / Chronic Care Management: CRR required this visit?  No   CCM required this visit?  Appt scheduled with PCP    Plan:     I have personally reviewed and noted the following in the patient's chart:   Medical and social history Use of alcohol, tobacco or illicit drugs  Current medications and supplements including opioid prescriptions. Patient is not currently taking opioid prescriptions. Functional ability and  status Nutritional status Physical activity Advanced directives List of other physicians Hospitalizations, surgeries, and ER visits in previous 12 months Vitals Screenings to include cognitive, depression, and falls Referrals and appointments  In addition, I have reviewed and discussed with patient certain preventive protocols, quality metrics, and best practice recommendations. A written personalized care plan for preventive services as well as general preventive health recommendations were provided to patient.     Sue Lush, LPN   [id number]/62/[id number]   After Visit Summary: (MyChart) Due to this being a telephonic visit, the after visit summary with patients personalized plan was offered to patient via MyChart   Nurse Notes: The patient states he is doing well and has no concerns or questions at this time.

## 2023-09-14 ENCOUNTER — Ambulatory Visit: Payer: Medicare HMO | Admitting: Family Medicine

## 2023-09-18 ENCOUNTER — Ambulatory Visit: Payer: Medicare HMO | Admitting: Family Medicine

## 2023-09-18 ENCOUNTER — Encounter: Payer: Self-pay | Admitting: Family Medicine

## 2023-09-18 VITALS — BP 97/71 | HR 71 | Temp 97.5°F | Resp 20 | Ht 74.0 in | Wt 154.4 lb

## 2023-09-18 DIAGNOSIS — B351 Tinea unguium: Secondary | ICD-10-CM

## 2023-09-18 MED ORDER — TERBINAFINE HCL 250 MG PO TABS: 250.0000 mg | ORAL_TABLET | Freq: Every day | ORAL | 1 refills | Status: DC

## 2023-09-18 NOTE — Progress Notes (Signed)
Established patient visit   Patient: Steven Knight   DOB: [birth year]   52 y.o. Male  MRN: [id number] Visit Date: 09/18/2023  Today's healthcare provider: Jacky Kindle, FNP  Introduced to nurse practitioner role and practice setting.  All questions answered.  Discussed provider/patient relationship and expectations.  Subjective    HPI HPI     Nail Problem    Additional comments: Both feet all toenails its a fungus on all of them, have been treated the summer with over the counter but it has not helped      Last edited by Clois Comber on 09/18/2023  8:15 AM.     The patient, with a history of toenail fungus, presents with a persistent toenail issue that has been present for at least the summer, but possibly longer. The patient's power of attorney is present and assists with the conversation. The patient is agreeable to treatment options. The patient has not had any recent labs done. The patient has pets at home, including a [place] and a [place].  Medications: Outpatient Medications Prior to Visit  Medication Sig   mirtazapine (REMERON) 30 MG tablet Take 30 mg by mouth at bedtime.   propranolol (INDERAL) 10 MG tablet Take 1 tablet (10 mg total) by mouth 2 (two) times daily.   ARIPiprazole (ABILIFY) 10 MG tablet Take 5 mg by mouth daily.   ARIPiprazole (ABILIFY) 20 MG tablet Take 1 tablet (20 mg total) by mouth daily. (Patient not taking: Reported on 08/06/2023)   ARIPiprazole (ABILIFY) 5 MG tablet Take by mouth. (Patient not taking: Reported on 08/06/2023)   lamoTRIgine (LAMICTAL) 200 MG tablet Take by mouth. (Patient not taking: Reported on 09/18/2023)   LORazepam (ATIVAN) 0.5 MG tablet Take 1 tablet (0.5 mg total) by mouth every 8 (eight) hours as needed for anxiety. (Patient not taking: Reported on 09/18/2023)   mirtazapine (REMERON) 30 MG tablet Take by mouth. (Patient not taking: Reported on 05/07/2023)   No facility-administered medications prior to visit.      Objective    BP 97/71 (BP Location: Right Arm, Patient Position: Sitting, Cuff Size: Normal)   Pulse 71   Temp (!) 97.5 F (36.4 C)   Resp 20   Ht 6\' 2"  (1.88 m)   Wt 154 lb 6.4 oz (70 kg)   SpO2 100%   BMI 19.82 kg/m   Physical Exam Vitals and nursing note reviewed.  Constitutional:      Appearance: Normal appearance. He is normal weight.  HENT:     Head: Normocephalic and atraumatic.  Cardiovascular:     Rate and Rhythm: Normal rate and regular rhythm.     Pulses:          Dorsalis pedis pulses are 1+ on the right side and 1+ on the left side.       Posterior tibial pulses are 1+ on the right side and 1+ on the left side.     Heart sounds: Normal heart sounds.  Pulmonary:     Effort: Pulmonary effort is normal.     Breath sounds: Normal breath sounds.  Musculoskeletal:        General: Normal range of motion.     Cervical back: Normal range of motion.  Feet:     Right foot:     Skin integrity: Callus present.     Toenail Condition: Right toenails are abnormally thick and long. Fungal disease present.    Left foot:     Skin  integrity: Callus present.     Toenail Condition: Left toenails are abnormally thick and long. Fungal disease present.    Comments: See attached photo Skin:    General: Skin is warm and dry.     Capillary Refill: Capillary refill takes less than 2 seconds.  Neurological:     Mental Status: He is alert and oriented to person, place, and time. Mental status is at baseline.  Psychiatric:        Mood and Affect: Mood normal.        Behavior: Behavior normal.     Comments: At baseline       No results found for any visits on 09/18/23.  Assessment & Plan    Onychomycosis Chronic toenail fungus. Discussed options of oral antifungal medication or referral to podiatry for debridement and biopsy. Recent metabolic panel in August. -Start oral antifungal medication today. -Check in 3 months to assess response. -Refer to podiatry for further  management.  General Health Maintenance -Follow up in 6 months.  Leilani Merl, FNP, have reviewed all documentation for this visit. The documentation on 09/18/23 for the exam, diagnosis, procedures, and orders are all accurate and complete.  Jacky Kindle, FNP  [facility] [phone] (phone) [phone] (fax)  [facility]

## 2023-09-18 NOTE — Assessment & Plan Note (Signed)
Chronic, untreated Failed OTC treatment Reviewed CMP from 8/24 Start daily treatment Referral to podiatry

## 2023-10-15 ENCOUNTER — Encounter: Payer: Self-pay | Admitting: Podiatry

## 2023-10-15 ENCOUNTER — Ambulatory Visit: Payer: Medicare HMO | Admitting: Podiatry

## 2023-10-15 DIAGNOSIS — B351 Tinea unguium: Secondary | ICD-10-CM | POA: Diagnosis not present

## 2023-10-15 DIAGNOSIS — Z79899 Other long term (current) drug therapy: Secondary | ICD-10-CM

## 2023-10-15 NOTE — Progress Notes (Signed)
  Subjective:  Patient ID: Steven Knight, male    DOB: [birth year],  MRN: [id number]  Chief Complaint  Patient presents with   Nail Problem    "Look at my toenails."    52 y.o. male presents with the above complaint.  Patient presents with thickened onychodystrophy mycotic toenails x 10.  Patient states been present for quite some time he tried over-the-counter medication which has not helped.  He is currently placed on Lamisil by his primary care physician.  He has started taking the medication no issues whatsoever   Review of Systems: Negative except as noted in the HPI. Denies N/V/F/Ch.  Past Medical History:  Diagnosis Date   Anxiety    Autism    Autism    Bipolar 1 disorder (HCC)    Cancer (HCC)    B-cell Lymphoma [facility]   Depression     Current Outpatient Medications:    ARIPiprazole (ABILIFY) 10 MG tablet, Take 5 mg by mouth daily., Disp: , Rfl:    mirtazapine (REMERON) 30 MG tablet, Take 30 mg by mouth at bedtime., Disp: , Rfl:    propranolol (INDERAL) 10 MG tablet, Take 10 mg by mouth 2 (two) times daily. PRN for tremors, Disp: , Rfl:    terbinafine (LAMISIL) 250 MG tablet, Take 1 tablet (250 mg total) by mouth daily., Disp: 90 tablet, Rfl: 1  Social History   Tobacco Use  Smoking Status Never  Smokeless Tobacco Never    No Known Allergies Objective:  There were no vitals filed for this visit. There is no height or weight on file to calculate BMI. Constitutional Well developed. Well nourished.  Vascular Dorsalis pedis pulses palpable bilaterally. Posterior tibial pulses palpable bilaterally. Capillary refill normal to all digits.  No cyanosis or clubbing noted. Pedal hair growth normal.  Neurologic Normal speech. Oriented to person, place, and time. Epicritic sensation to light touch grossly present bilaterally.  Dermatologic Nails thickened elongated dystrophic mycotic toenails x 10 mild pain on palpation Skin within normal limits   Orthopedic: Normal joint ROM without pain or crepitus bilaterally. No visible deformities. No bony tenderness.   Radiographs: None Assessment:   1. Long-term use of high-risk medication   2. Nail fungus   3. Onychomycosis due to dermatophyte    Plan:  Patient was evaluated and treated and all questions answered.  Toenails x 10 -Educated the patient on the etiology of onychomycosis and various treatment options associated with improving the fungal load.  I explained to the patient that there is 3 treatment options available to treat the onychomycosis including topical, p.o., laser treatment.  Patient elected to undergo p.o. options with Lamisil/terbinafine therapy.  In order for me to start the medication therapy, I explained to the patient the importance of evaluating the liver and obtaining the liver function test.  Once the liver function test comes back normal I will start him on 31-month course of Lamisil therapy.  Patient understood all risk and would like to proceed with Lamisil therapy.  I have asked the patient to immediately stop the Lamisil therapy if she has any reactions to it and call the office or go to the emergency room right away.  Patient states understanding   No follow-ups on file.

## 2023-11-09 ENCOUNTER — Inpatient Hospital Stay: Payer: Medicare HMO | Attending: Oncology

## 2023-11-09 DIAGNOSIS — Z79620 Long term (current) use of immunosuppressive biologic: Secondary | ICD-10-CM | POA: Insufficient documentation

## 2023-11-09 DIAGNOSIS — F840 Autistic disorder: Secondary | ICD-10-CM | POA: Insufficient documentation

## 2023-11-09 DIAGNOSIS — D479 Neoplasm of uncertain behavior of lymphoid, hematopoietic and related tissue, unspecified: Secondary | ICD-10-CM | POA: Diagnosis present

## 2023-11-09 DIAGNOSIS — R161 Splenomegaly, not elsewhere classified: Secondary | ICD-10-CM | POA: Insufficient documentation

## 2023-11-09 DIAGNOSIS — R634 Abnormal weight loss: Secondary | ICD-10-CM | POA: Diagnosis not present

## 2023-11-09 DIAGNOSIS — D472 Monoclonal gammopathy: Secondary | ICD-10-CM | POA: Diagnosis not present

## 2023-11-09 DIAGNOSIS — Z79899 Other long term (current) drug therapy: Secondary | ICD-10-CM | POA: Insufficient documentation

## 2023-11-09 DIAGNOSIS — D696 Thrombocytopenia, unspecified: Secondary | ICD-10-CM | POA: Insufficient documentation

## 2023-11-09 DIAGNOSIS — F319 Bipolar disorder, unspecified: Secondary | ICD-10-CM | POA: Diagnosis not present

## 2023-11-09 LAB — CBC WITH DIFFERENTIAL (CANCER CENTER ONLY)
Abs Immature Granulocytes: 0.17 10*3/uL — ABNORMAL HIGH (ref 0.00–0.07)
Basophils Absolute: 0.1 10*3/uL (ref 0.0–0.1)
Basophils Relative: 1 %
Eosinophils Absolute: 0.1 10*3/uL (ref 0.0–0.5)
Eosinophils Relative: 2 %
HCT: 38.6 % — ABNORMAL LOW (ref 39.0–52.0)
Hemoglobin: 12.8 g/dL — ABNORMAL LOW (ref 13.0–17.0)
Immature Granulocytes: 2 %
Lymphocytes Relative: 20 %
Lymphs Abs: 1.8 10*3/uL (ref 0.7–4.0)
MCH: 29.9 pg (ref 26.0–34.0)
MCHC: 33.2 g/dL (ref 30.0–36.0)
MCV: 90.2 fL (ref 80.0–100.0)
Monocytes Absolute: 1 10*3/uL (ref 0.1–1.0)
Monocytes Relative: 11 %
Neutro Abs: 5.9 10*3/uL (ref 1.7–7.7)
Neutrophils Relative %: 64 %
Platelet Count: 79 10*3/uL — ABNORMAL LOW (ref 150–400)
RBC: 4.28 MIL/uL (ref 4.22–5.81)
RDW: 12.6 % (ref 11.5–15.5)
WBC Count: 9 10*3/uL (ref 4.0–10.5)
nRBC: 0 % (ref 0.0–0.2)

## 2023-11-09 LAB — TECHNOLOGIST SMEAR REVIEW
Plt Morphology: NORMAL
RBC MORPHOLOGY: NORMAL
WBC MORPHOLOGY: NORMAL

## 2023-11-09 LAB — CMP (CANCER CENTER ONLY)
ALT: 27 U/L (ref 0–44)
AST: 17 U/L (ref 15–41)
Albumin: 4 g/dL (ref 3.5–5.0)
Alkaline Phosphatase: 60 U/L (ref 38–126)
Anion gap: 9 (ref 5–15)
BUN: 23 mg/dL — ABNORMAL HIGH (ref 6–20)
CO2: 28 mmol/L (ref 22–32)
Calcium: 8.7 mg/dL — ABNORMAL LOW (ref 8.9–10.3)
Chloride: 103 mmol/L (ref 98–111)
Creatinine: 0.88 mg/dL (ref 0.61–1.24)
GFR, Estimated: >60 mL/min (ref >60)
Glucose, Bld: 112 mg/dL — ABNORMAL HIGH (ref 70–99)
Potassium: 4.1 mmol/L (ref 3.5–5.1)
Sodium: 140 mmol/L (ref 135–145)
Total Bilirubin: 0.9 mg/dL (ref <1.2)
Total Protein: 6.5 g/dL (ref 6.5–8.1)

## 2023-11-09 LAB — IMMATURE PLATELET FRACTION: Immature Platelet Fraction: 11.9 % — ABNORMAL HIGH (ref 1.2–8.6)

## 2023-11-09 LAB — TSH: TSH: 2.932 u[IU]/mL (ref 0.350–4.500)

## 2023-11-09 LAB — FOLATE: Folate: 10.4 ng/mL (ref >5.9)

## 2023-11-09 LAB — LACTATE DEHYDROGENASE: LDH: 120 U/L (ref 98–192)

## 2023-11-09 LAB — VITAMIN B12: Vitamin B-12: 374 pg/mL (ref 180–914)

## 2023-11-11 ENCOUNTER — Inpatient Hospital Stay (HOSPITAL_BASED_OUTPATIENT_CLINIC_OR_DEPARTMENT_OTHER): Payer: Medicare HMO | Admitting: Oncology

## 2023-11-11 ENCOUNTER — Encounter: Payer: Self-pay | Admitting: Oncology

## 2023-11-11 VITALS — BP 125/83 | HR 71 | Temp 96.8°F | Resp 18 | Wt 159.6 lb

## 2023-11-11 DIAGNOSIS — D696 Thrombocytopenia, unspecified: Secondary | ICD-10-CM | POA: Diagnosis not present

## 2023-11-11 DIAGNOSIS — D479 Neoplasm of uncertain behavior of lymphoid, hematopoietic and related tissue, unspecified: Secondary | ICD-10-CM

## 2023-11-11 DIAGNOSIS — D472 Monoclonal gammopathy: Secondary | ICD-10-CM | POA: Diagnosis not present

## 2023-11-11 DIAGNOSIS — R634 Abnormal weight loss: Secondary | ICD-10-CM

## 2023-11-11 DIAGNOSIS — R161 Splenomegaly, not elsewhere classified: Secondary | ICD-10-CM

## 2023-11-11 NOTE — Assessment & Plan Note (Signed)
Weight has been stable 

## 2023-11-11 NOTE — Progress Notes (Signed)
Hematology/Oncology Progress note [phone] [phone]      Patient Care Team: Sherlyn Hay, DO as PCP - General (Family Medicine) Rickard Patience, MD as Consulting Physician (Hematology and Oncology) Sherlyn Hay, DO as Consulting Physician (Family Medicine) Pa, [facility] (Optometry)  ASSESSMENT & PLAN:   B-cell lymphoproliferative disorder (HCC) IgM MGUS, monoclonal lymphocytosis, mild splenomegaly. Status post weekly rituximab x 4   Patient is clinically doing well. Continue observation.    Thrombocytopenia (HCC) #Thrombocytopenia, due to peripheral destruction/sequestion or consumption.  primary ITP versus secondary ITP related to underlying B-cell lymphoproliferative disorder.  See below. Previous bone marrow biopsy indicates underlying B-cell lymphoproliferative disorder. + B-cell rearrangement, - MYD88, - t(11,18) Cytogenetics is normal.  Labs are reviewed and discussed with patient.   Thrombocytopenia has slightly improved.  Close to baseline.  Continue observation.  Splenomegaly Normal spleen size on recent US  MGUS (monoclonal gammopathy of unknown significance) Stable M protein. Observation.   Weight loss Weight has been stable.    Orders Placed This Encounter  Procedures   CBC with Differential (Cancer Center Only)    Standing Status:   Future    Standing Expiration Date:   11/10/2024   CMP (Cancer Center only)    Standing Status:   Future    Standing Expiration Date:   11/10/2024   Immature Platelet Fraction    Standing Status:   Future    Standing Expiration Date:   11/10/2024   Follow up in 6 months.  All questions were answered. The patient knows to call the clinic with any problems, questions or concerns.  Rickard Patience, MD, PhD [facility] Hematology Oncology 11/11/2023   REASON FOR VISIT Follow up for treatment of thrombocytopenia,  low grade B cell lymphoma, splenomegaly  HISTORY OF PRESENTING ILLNESS:   Steven Knight is a  52 y.o.  male presents for follow up of thrombocytopenia, low grade B cell lymphoma. splenomegaly Patient has autism and bipolar disease.  Poor historian Off Depakote.   History is obtained from wife.  # had a course of 4 days of dexamethasone and the platelet count normalized to 160.000 # 11/27/20 - 12/19/2020 weekly treatments of rituximab x 4.  His Abilify dose has been lowered recently, and started on lamotrigine for about 2 months.  INTERVAL HISTORY Steven Knight is a 52 y.o. male who has above history reviewed by me today presents for follow-up of thrombocytopenia, low-grade B-cell lymphoproliferative disorder He was accompanied by his wife.  No new complaints. No active bleeding events.  Patient has good appetite.   No unintentional weight loss.   .Review of Systems  Unable to perform ROS: Psychiatric disorder  Constitutional:  Negative for fever, malaise/fatigue and weight loss.  Respiratory:  Negative for cough.   Cardiovascular:  Negative for palpitations and leg swelling.  Gastrointestinal:  Negative for nausea and vomiting.  Genitourinary:  Negative for urgency.  Skin:  Negative for rash.  Neurological:  Negative for dizziness.  Endo/Heme/Allergies:  Does not bruise/bleed easily.  Psychiatric/Behavioral:  The patient is not nervous/anxious.     MEDICAL HISTORY:  Past Medical History:  Diagnosis Date   Anxiety    Autism    Autism    Bipolar 1 disorder (HCC)    Cancer (HCC)    B-cell Lymphoma Dr Sharyne Richters   Depression     SURGICAL HISTORY: Past Surgical History:  Procedure Laterality Date   COLONOSCOPY WITH PROPOFOL N/A 05/20/2023   Procedure: COLONOSCOPY WITH  PROPOFOL;  Surgeon: Midge Minium, MD;  Location: [facility] ENDOSCOPY;  Service: Endoscopy;  Laterality: N/A;   EYE SURGERY     No surgery SOCIAL HISTORY: Social History   Socioeconomic History   Marital status: Married    Spouse name: Not on file    Number of children: Not on file   Years of education: Not on file   Highest education level: Not on file  Occupational History   Not on file  Tobacco Use   Smoking status: Never   Smokeless tobacco: Never  Vaping Use   Vaping status: Never Used  Substance and Sexual Activity   Alcohol use: Never   Drug use: Never   Sexual activity: Yes    Birth control/protection: None  Other Topics Concern   Not on file  Social History Narrative   Not on file   Social Determinants of Health   Financial Resource Strain: Low Risk  (09/04/2023)   Overall Financial Resource Strain (CARDIA)    Difficulty of Paying Living Expenses: Not hard at all  Food Insecurity: No Food Insecurity (09/04/2023)   Hunger Vital Sign    Worried About Running Out of Food in the Last Year: Never true    Ran Out of Food in the Last Year: Never true  Transportation Needs: No Transportation Needs (09/04/2023)   PRAPARE - Administrator, Civil Service (Medical): No    Lack of Transportation (Non-Medical): No  Physical Activity: Inactive (09/04/2023)   Exercise Vital Sign    Days of Exercise per Week: 0 days    Minutes of Exercise per Session: 0 min  Stress: Stress Concern Present (09/04/2023)   [employer] of Occupational Health - Occupational Stress Questionnaire    Feeling of Stress : Rather much  Social Connections: Unknown (09/04/2023)   Social Connection and Isolation Panel [NHANES]    Frequency of Communication with Friends and Family: Once a week    Frequency of Social Gatherings with Friends and Family: Once a week    Attends Religious Services: Not on Insurance claims handler of Clubs or Organizations: No    Attends Banker Meetings: Never    Marital Status: Married  Catering manager Violence: Not At Risk (09/07/2023)   Humiliation, Afraid, Rape, and Kick questionnaire    Fear of Current or Ex-Partner: No    Emotionally Abused: No    Physically Abused: No    Sexually Abused: No     FAMILY HISTORY: Family History  Problem Relation Age of Onset   Diabetes Mother    Dementia Paternal Grandfather     ALLERGIES:  has No Known Allergies.  MEDICATIONS:  Current Outpatient Medications  Medication Sig Dispense Refill   ARIPiprazole (ABILIFY) 10 MG tablet Take 5 mg by mouth daily.     mirtazapine (REMERON) 30 MG tablet Take 30 mg by mouth at bedtime.     propranolol (INDERAL) 10 MG tablet Take 10 mg by mouth 2 (two) times daily. PRN for tremors     terbinafine (LAMISIL) 250 MG tablet Take 1 tablet (250 mg total) by mouth daily. (Patient not taking: Reported on 11/11/2023) 90 tablet 1   No current facility-administered medications for this visit.     PHYSICAL EXAMINATION: ECOG PERFORMANCE STATUS: 1 - Symptomatic but completely ambulatory Vitals:   11/11/23 1310  BP: 125/83  Pulse: 71  Resp: 18  Temp: (!) 96.8 F (36 C)   Filed Weights   11/11/23 1310  Weight: 159 lb  9.6 oz (72.4 kg)    Physical Exam Constitutional:      General: He is not in acute distress. HENT:     Head: Normocephalic and atraumatic.  Eyes:     General: No scleral icterus.    Pupils: Pupils are equal, round, and reactive to light.  Cardiovascular:     Rate and Rhythm: Normal rate and regular rhythm.     Heart sounds: Normal heart sounds.  Pulmonary:     Effort: Pulmonary effort is normal. No respiratory distress.     Breath sounds: No wheezing.  Abdominal:     General: Bowel sounds are normal.     Palpations: Abdomen is soft.  Musculoskeletal:        General: No deformity. Normal range of motion.     Cervical back: Normal range of motion and neck supple.  Skin:    General: Skin is warm and dry.     Findings: No erythema or rash.  Neurological:     Mental Status: He is alert and oriented to person, place, and time.     Cranial Nerves: No cranial nerve deficit.     Coordination: Coordination normal.      LABORATORY DATA:  I have reviewed the data as listed Lab  Results  Component Value Date   WBC 9.0 11/09/2023   HGB 12.8 (L) 11/09/2023   HCT 38.6 (L) 11/09/2023   MCV 90.2 11/09/2023   PLT 79 (L) 11/09/2023   Recent Labs    05/07/23 0922 07/27/23 1105 11/09/23 0959  NA 138 135 140  K 4.5 4.1 4.1  CL 103 103 103  CO2 27 27 28   GLUCOSE 71 92 112*  BUN 12 19 23*  CREATININE 0.96 0.90 0.88  CALCIUM 9.0 8.7* 8.7*  GFRNONAA >60 >60 >60  PROT 7.1 6.5 6.5  ALBUMIN 4.4 4.1 4.0  AST 17 16 17   ALT 21 19 27   ALKPHOS 61 46 60  BILITOT 1.0 1.5* 0.9   Iron/TIBC/Ferritin/ %Sat No results found for: "IRON", "TIBC", "FERRITIN", "IRONPCTSAT"    Normal LDH, negative hepatitis and HIV, normal B12 and folate level, normal spleen size. Negative flowcytometry. Patient has history of positive anti-TPO.  Patient was seen by rheumatologist last year.

## 2023-11-11 NOTE — Assessment & Plan Note (Signed)
IgM MGUS, monoclonal lymphocytosis, mild splenomegaly. Status post weekly rituximab x 4   Patient is clinically doing well. Continue observation.

## 2023-11-11 NOTE — Assessment & Plan Note (Signed)
Stable M protein. Observation.

## 2023-11-11 NOTE — Assessment & Plan Note (Addendum)
#  Thrombocytopenia, due to peripheral destruction/sequestion or consumption.  primary ITP versus secondary ITP related to underlying B-cell lymphoproliferative disorder.  See below. Previous bone marrow biopsy indicates underlying B-cell lymphoproliferative disorder. + B-cell rearrangement, - MYD88, - t(11,18) Cytogenetics is normal.  Labs are reviewed and discussed with patient.   Thrombocytopenia has slightly improved.  Close to baseline.  Continue observation.

## 2023-11-11 NOTE — Assessment & Plan Note (Signed)
Normal spleen size on recent US

## 2023-12-06 ENCOUNTER — Encounter: Payer: Self-pay | Admitting: Family Medicine

## 2024-01-08 ENCOUNTER — Telehealth: Payer: Self-pay | Admitting: Podiatry

## 2024-01-08 NOTE — Telephone Encounter (Signed)
Pts wife called and cxled appt on 2/7 for routine follow up because they do not need. She did ask about refill on the Lamisil and I checked chart and his np has sent it in and it appeared to have 1 refill left. I told her if there is a problem and no refills left to let us know. She asked if  they would call if they feel medication is not working and I told her absolutely.

## 2024-01-15 ENCOUNTER — Ambulatory Visit: Payer: Medicare HMO | Admitting: Podiatry

## 2024-03-10 ENCOUNTER — Telehealth: Payer: Self-pay | Admitting: Family Medicine

## 2024-03-10 NOTE — Telephone Encounter (Signed)
 [facility] is requesting refill terbinafine (LAMISIL) 250 MG tablet  Please advise

## 2024-03-14 ENCOUNTER — Other Ambulatory Visit: Payer: Self-pay | Admitting: Family Medicine

## 2024-03-14 NOTE — Telephone Encounter (Signed)
 [facility] pharmacy is requesting refill terbinafine (LAMISIL) 250 MG tablet   Please advise

## 2024-04-07 DIAGNOSIS — Z79899 Other long term (current) drug therapy: Secondary | ICD-10-CM | POA: Diagnosis not present

## 2024-05-05 ENCOUNTER — Other Ambulatory Visit: Payer: Self-pay

## 2024-05-05 ENCOUNTER — Inpatient Hospital Stay: Attending: Oncology

## 2024-05-05 DIAGNOSIS — D479 Neoplasm of uncertain behavior of lymphoid, hematopoietic and related tissue, unspecified: Secondary | ICD-10-CM

## 2024-05-05 DIAGNOSIS — D696 Thrombocytopenia, unspecified: Secondary | ICD-10-CM | POA: Insufficient documentation

## 2024-05-05 DIAGNOSIS — D472 Monoclonal gammopathy: Secondary | ICD-10-CM | POA: Diagnosis not present

## 2024-05-05 LAB — CMP (CANCER CENTER ONLY)
ALT: 18 U/L (ref 0–44)
AST: 17 U/L (ref 15–41)
Albumin: 4.2 g/dL (ref 3.5–5.0)
Alkaline Phosphatase: 50 U/L (ref 38–126)
Anion gap: 5 (ref 5–15)
BUN: 24 mg/dL — ABNORMAL HIGH (ref 6–20)
CO2: 26 mmol/L (ref 22–32)
Calcium: 8.5 mg/dL — ABNORMAL LOW (ref 8.9–10.3)
Chloride: 108 mmol/L (ref 98–111)
Creatinine: 1.06 mg/dL (ref 0.61–1.24)
GFR, Estimated: >60 mL/min (ref >60)
Glucose, Bld: 115 mg/dL — ABNORMAL HIGH (ref 70–99)
Potassium: 3.9 mmol/L (ref 3.5–5.1)
Sodium: 139 mmol/L (ref 135–145)
Total Bilirubin: 1.2 mg/dL (ref 0.0–1.2)
Total Protein: 6.6 g/dL (ref 6.5–8.1)

## 2024-05-05 LAB — CBC WITH DIFFERENTIAL (CANCER CENTER ONLY)
Abs Immature Granulocytes: 0.25 10*3/uL — ABNORMAL HIGH (ref 0.00–0.07)
Basophils Absolute: 0.1 10*3/uL (ref 0.0–0.1)
Basophils Relative: 1 %
Eosinophils Absolute: 0.1 10*3/uL (ref 0.0–0.5)
Eosinophils Relative: 2 %
HCT: 39.4 % (ref 39.0–52.0)
Hemoglobin: 13.6 g/dL (ref 13.0–17.0)
Immature Granulocytes: 4 %
Lymphocytes Relative: 38 %
Lymphs Abs: 2.4 10*3/uL (ref 0.7–4.0)
MCH: 29.7 pg (ref 26.0–34.0)
MCHC: 34.5 g/dL (ref 30.0–36.0)
MCV: 86 fL (ref 80.0–100.0)
Monocytes Absolute: 0.5 10*3/uL (ref 0.1–1.0)
Monocytes Relative: 9 %
Neutro Abs: 2.9 10*3/uL (ref 1.7–7.7)
Neutrophils Relative %: 46 %
Platelet Count: 57 10*3/uL — ABNORMAL LOW (ref 150–400)
RBC: 4.58 MIL/uL (ref 4.22–5.81)
RDW: 13.2 % (ref 11.5–15.5)
WBC Count: 6.3 10*3/uL (ref 4.0–10.5)
nRBC: 0 % (ref 0.0–0.2)

## 2024-05-05 LAB — VITAMIN B12: Vitamin B-12: 339 pg/mL (ref 180–914)

## 2024-05-05 LAB — IMMATURE PLATELET FRACTION: Immature Platelet Fraction: 13.2 % — ABNORMAL HIGH (ref 1.2–8.6)

## 2024-05-06 ENCOUNTER — Other Ambulatory Visit: Payer: Medicare HMO

## 2024-05-06 LAB — KAPPA/LAMBDA LIGHT CHAINS
Kappa free light chain: 23.7 mg/L — ABNORMAL HIGH (ref 3.3–19.4)
Kappa, lambda light chain ratio: 1.17 (ref 0.26–1.65)
Lambda free light chains: 20.3 mg/L (ref 5.7–26.3)

## 2024-05-08 LAB — MULTIPLE MYELOMA PANEL, SERUM
Albumin SerPl Elph-Mcnc: 3.8 g/dL (ref 2.9–4.4)
Albumin/Glob SerPl: 1.7 (ref 0.7–1.7)
Alpha 1: 0.1 g/dL (ref 0.0–0.4)
Alpha2 Glob SerPl Elph-Mcnc: 0.4 g/dL (ref 0.4–1.0)
B-Globulin SerPl Elph-Mcnc: 0.8 g/dL (ref 0.7–1.3)
Gamma Glob SerPl Elph-Mcnc: 1 g/dL (ref 0.4–1.8)
Globulin, Total: 2.3 g/dL (ref 2.2–3.9)
IgA: 90 mg/dL (ref 90–386)
IgG (Immunoglobin G), Serum: 867 mg/dL (ref 603–1613)
IgM (Immunoglobulin M), Srm: 276 mg/dL — ABNORMAL HIGH (ref 20–172)
M Protein SerPl Elph-Mcnc: 0.3 g/dL — ABNORMAL HIGH
Total Protein ELP: 6.1 g/dL (ref 6.0–8.5)

## 2024-05-13 ENCOUNTER — Inpatient Hospital Stay: Payer: Medicare HMO | Attending: Oncology | Admitting: Oncology

## 2024-05-13 ENCOUNTER — Encounter: Payer: Self-pay | Admitting: Oncology

## 2024-05-13 VITALS — BP 100/67 | HR 88 | Temp 98.9°F | Resp 19 | Wt 164.1 lb

## 2024-05-13 DIAGNOSIS — Z79899 Other long term (current) drug therapy: Secondary | ICD-10-CM | POA: Diagnosis not present

## 2024-05-13 DIAGNOSIS — D472 Monoclonal gammopathy: Secondary | ICD-10-CM | POA: Diagnosis not present

## 2024-05-13 DIAGNOSIS — D479 Neoplasm of uncertain behavior of lymphoid, hematopoietic and related tissue, unspecified: Secondary | ICD-10-CM | POA: Diagnosis not present

## 2024-05-13 DIAGNOSIS — D696 Thrombocytopenia, unspecified: Secondary | ICD-10-CM | POA: Diagnosis not present

## 2024-05-13 NOTE — Assessment & Plan Note (Signed)
IgM MGUS, monoclonal lymphocytosis, mild splenomegaly. Status post weekly rituximab x 4   Patient is clinically doing well. Continue observation.

## 2024-05-14 NOTE — Assessment & Plan Note (Signed)
#  Thrombocytopenia, due to peripheral destruction/sequestion or consumption.  primary ITP versus secondary ITP related to underlying B-cell lymphoproliferative disorder.  See below. Previous bone marrow biopsy indicates underlying B-cell lymphoproliferative disorder. + B-cell rearrangement, - MYD88, - t(11,18) Cytogenetics is normal.  Labs are reviewed and discussed with patient.   Thrombocytopenia has slightly decreased Continue observation.

## 2024-05-14 NOTE — Progress Notes (Signed)
 Hematology/Oncology Progress note [phone] [phone]      Patient Care Team: Carlean Charter, DO as PCP - General (Family Medicine) [name], MD as Consulting Physician (Hematology and Oncology) Carlean Charter, DO as Consulting Physician (Family Medicine) [facility], [facility] (Optometry)  ASSESSMENT & PLAN:   B-cell lymphoproliferative disorder (HCC) IgM MGUS, monoclonal lymphocytosis, mild splenomegaly. Status post weekly rituximab  x 4   Patient is clinically doing well. Continue observation.    MGUS (monoclonal gammopathy of unknown significance) Stable M protein. Observation.   Thrombocytopenia (HCC) #Thrombocytopenia, due to peripheral destruction/sequestion or consumption.  primary ITP versus secondary ITP related to underlying B-cell lymphoproliferative disorder.  See below. Previous bone marrow biopsy indicates underlying B-cell lymphoproliferative disorder. + B-cell rearrangement, - MYD88, - t(11,18) Cytogenetics is normal.  Labs are reviewed and discussed with patient.   Thrombocytopenia has slightly decreased Continue observation.  Hypocalcemia Recommend patient to take calcium supplementation 600 mg daily and vitamin D supplementation.   Orders Placed This Encounter  Procedures   CBC with Differential (Cancer Center Only)    Standing Status:   Future    Expected Date:   11/12/2024    Expiration Date:   05/13/2025   CMP (Cancer Center only)    Standing Status:   Future    Expected Date:   11/12/2024    Expiration Date:   05/13/2025   Immature Platelet Fraction    Standing Status:   Future    Expected Date:   11/12/2024    Expiration Date:   05/13/2025   Vitamin B12    Standing Status:   Future    Expected Date:   11/12/2024    Expiration Date:   05/13/2025   Multiple Myeloma Panel (SPEP&IFE w/QIG)    Standing Status:   Future    Expected Date:   11/12/2024    Expiration Date:   05/13/2025   Kappa/lambda light chains    Standing  Status:   Future    Expected Date:   11/12/2024    Expiration Date:   05/13/2025   Follow up in 6 months.  All questions were answered. The patient knows to call the clinic with any problems, questions or concerns.  [name], MD, PhD [facility] Hematology Oncology 05/13/2024   REASON FOR VISIT Follow up for treatment of thrombocytopenia,  low grade B cell lymphoma, splenomegaly  HISTORY OF PRESENTING ILLNESS:  Steven Knight is a  53 y.o.  male presents for follow up of thrombocytopenia, low grade B cell lymphoma. splenomegaly Patient has autism and bipolar disease.  Poor historian Off Depakote .   History is obtained from wife.  # had a course of 4 days of dexamethasone  and the platelet count normalized to 160.000 # 11/27/20 - 12/19/2020 weekly treatments of rituximab  x 4.  His Abilify  dose has been lowered recently, and started on lamotrigine for about 2 months.  INTERVAL HISTORY Steven Knight is a 53 y.o. male who has above history reviewed by me today presents for follow-up of thrombocytopenia, low-grade B-cell lymphoproliferative disorder He was accompanied by his wife.  No new complaints. No active bleeding events.  Patient has good appetite.   Patient has gained some weight.   .Review of Systems  Unable to perform ROS: Psychiatric disorder  Constitutional:  Negative for fever, malaise/fatigue and weight loss.  Respiratory:  Negative for cough.   Cardiovascular:  Negative for palpitations and leg swelling.  Gastrointestinal:  Negative for nausea and vomiting.  Genitourinary:  Negative for urgency.  Skin:  Negative for rash.  Neurological:  Negative for dizziness.  Endo/Heme/Allergies:  Does not bruise/bleed easily.  Psychiatric/Behavioral:  The patient is not nervous/anxious.     MEDICAL HISTORY:  Past Medical History:  Diagnosis Date   Anxiety    Autism    Autism    Bipolar 1 disorder (HCC)    Cancer (HCC)    B-cell Lymphoma [facility]   Depression     SURGICAL HISTORY: Past Surgical History:  Procedure Laterality Date   COLONOSCOPY WITH PROPOFOL  N/A 05/20/2023   Procedure: COLONOSCOPY WITH PROPOFOL ;  Surgeon: Marnee Sink, MD;  Location: [facility] ENDOSCOPY;  Service: Endoscopy;  Laterality: N/A;   EYE SURGERY     No surgery SOCIAL HISTORY: Social History   Socioeconomic History   Marital status: Married    Spouse name: Not on file   Number of children: Not on file   Years of education: Not on file   Highest education level: Not on file  Occupational History   Not on file  Tobacco Use   Smoking status: Never   Smokeless tobacco: Never  Vaping Use   Vaping status: Never Used  Substance and Sexual Activity   Alcohol use: Never   Drug use: Never   Sexual activity: Yes    Birth control/protection: None  Other Topics Concern   Not on file  Social History Narrative   Not on file   Social Drivers of Health   Financial Resource Strain: Low Risk  (09/04/2023)   Overall Financial Resource Strain (CARDIA)    Difficulty of Paying Living Expenses: Not hard at all  Food Insecurity: No Food Insecurity (09/04/2023)   Hunger Vital Sign    Worried About Running Out of Food in the Last Year: Never true    Ran Out of Food in the Last Year: Never true  Transportation Needs: No Transportation Needs (09/04/2023)   PRAPARE - Administrator, Civil Service (Medical): No    Lack of Transportation (Non-Medical): No  Physical Activity: Inactive (09/04/2023)   Exercise Vital Sign    Days of Exercise per Week: 0 days    Minutes of Exercise per Session: 0 min  Stress: Stress Concern Present (09/04/2023)   [employer] of Occupational Health - Occupational Stress Questionnaire    Feeling of Stress : Rather much  Social Connections: Unknown (09/04/2023)   Social Connection and Isolation Panel [NHANES]    Frequency of Communication with Friends and Family: Once a week    Frequency of Social Gatherings with  Friends and Family: Once a week    Attends Religious Services: Not on Insurance claims handler of Clubs or Organizations: No    Attends Banker Meetings: Never    Marital Status: Married  Catering manager Violence: Not At Risk (09/07/2023)   Humiliation, Afraid, Rape, and Kick questionnaire    Fear of Current or Ex-Partner: No    Emotionally Abused: No    Physically Abused: No    Sexually Abused: No    FAMILY HISTORY: Family History  Problem Relation Age of Onset   Diabetes Mother    Dementia Paternal Grandfather     ALLERGIES:  has no known allergies.  MEDICATIONS:  Current Outpatient Medications  Medication Sig Dispense Refill   ARIPiprazole  (ABILIFY ) 10 MG tablet Take 5 mg by mouth daily.     mirtazapine  (REMERON ) 30 MG tablet Take 30 mg by mouth at bedtime.  propranolol (INDERAL) 10 MG tablet Take by mouth.     terbinafine  (LAMISIL ) 250 MG tablet Take 1 tablet (250 mg total) by mouth daily. (Patient not taking: Reported on 05/13/2024) 90 tablet 1   No current facility-administered medications for this visit.     PHYSICAL EXAMINATION: ECOG PERFORMANCE STATUS: 1 - Symptomatic but completely ambulatory Vitals:   05/13/24 1235  BP: 100/67  Pulse: 88  Resp: 19  Temp: 98.9 F (37.2 C)  SpO2: 97%   Filed Weights   05/13/24 1235  Weight: 164 lb 1.6 oz (74.4 kg)    Physical Exam Constitutional:      General: He is not in acute distress. HENT:     Head: Normocephalic and atraumatic.  Eyes:     General: No scleral icterus.    Pupils: Pupils are equal, round, and reactive to light.  Cardiovascular:     Rate and Rhythm: Normal rate and regular rhythm.     Heart sounds: Normal heart sounds.  Pulmonary:     Effort: Pulmonary effort is normal. No respiratory distress.     Breath sounds: No wheezing.  Abdominal:     General: Bowel sounds are normal.     Palpations: Abdomen is soft.  Musculoskeletal:        General: No deformity. Normal range of motion.      Cervical back: Normal range of motion and neck supple.  Skin:    General: Skin is warm and dry.     Findings: No erythema or rash.  Neurological:     Mental Status: He is alert and oriented to person, place, and time.     Cranial Nerves: No cranial nerve deficit.     Coordination: Coordination normal.      LABORATORY DATA:  I have reviewed the data as listed Lab Results  Component Value Date   WBC 6.3 05/05/2024   HGB 13.6 05/05/2024   HCT 39.4 05/05/2024   MCV 86.0 05/05/2024   PLT 57 (L) 05/05/2024   Recent Labs    07/27/23 1105 11/09/23 0959 05/05/24 0755  NA 135 140 139  K 4.1 4.1 3.9  CL 103 103 108  CO2 27 28 26   GLUCOSE 92 112* 115*  BUN 19 23* 24*  CREATININE 0.90 0.88 1.06  CALCIUM 8.7* 8.7* 8.5*  GFRNONAA >60 >60 >60  PROT 6.5 6.5 6.6  ALBUMIN 4.1 4.0 4.2  AST 16 17 17   ALT 19 27 18   ALKPHOS 46 60 50  BILITOT 1.5* 0.9 1.2   Iron/TIBC/Ferritin/ %Sat No results found for: "IRON", "TIBC", "FERRITIN", "IRONPCTSAT"    Normal LDH, negative hepatitis and HIV, normal B12 and folate level, normal spleen size. Negative flowcytometry. Patient has history of positive anti-TPO.  Patient was seen by rheumatologist last year.

## 2024-05-14 NOTE — Assessment & Plan Note (Signed)
Stable M protein. Observation.

## 2024-05-14 NOTE — Assessment & Plan Note (Signed)
 Recommend patient to take calcium supplementation 600 mg daily and vitamin D supplementation.

## 2024-09-07 ENCOUNTER — Ambulatory Visit: Payer: Self-pay | Admitting: Emergency Medicine

## 2024-09-07 VITALS — Ht 74.0 in | Wt 155.0 lb

## 2024-09-07 DIAGNOSIS — Z0000 Encounter for general adult medical examination without abnormal findings: Secondary | ICD-10-CM

## 2024-09-07 NOTE — Progress Notes (Signed)
 Subjective:   Steven Knight is a 53 y.o. who presents for a Medicare Wellness preventive visit.  As a reminder, Annual Wellness Visits don't include a physical exam, and some assessments may be limited, especially if this visit is performed virtually. We may recommend an in-person follow-up visit with your provider if needed.  Visit Complete: Virtual I connected with  Steven Knight on 09/07/24 by a audio enabled telemedicine application and verified that I am speaking with the correct person using two identifiers.  Patient Location: Home  Provider Location: Home Office  I discussed the limitations of evaluation and management by telemedicine. The patient expressed understanding and agreed to proceed.  Vital Signs: Because this visit was a virtual/telehealth visit, some criteria may be missing or patient reported. Any vitals not documented were not able to be obtained and vitals that have been documented are patient reported.  VideoDeclined- This patient declined Librarian, academic. Therefore the visit was completed with audio only.  Persons Participating in Visit: Patient.  AWV Questionnaire: No: Patient Medicare AWV questionnaire was not completed prior to this visit.  Cardiac Risk Factors include: male gender;dyslipidemia     Objective:    Today's Vitals   09/07/24 0807  Weight: 155 lb (70.3 kg)  Height: 6' 2 (1.88 m)   Body mass index is 19.9 kg/m.     09/07/2024    8:19 AM 05/13/2024   12:35 PM 11/11/2023    1:07 PM 09/07/2023    8:31 AM 05/20/2023    9:13 AM 05/07/2022    2:37 PM 11/05/2021   10:41 AM  Advanced Directives  Does Patient Have a Medical Advance Directive? Yes Yes No Yes Yes No Yes  Type of Estate agent of [place];Living will Living will;Healthcare Power of [employer] Power of [place];Living will Healthcare Power of [place];Living will  Healthcare Power of [place];Living will   Does patient want to make changes to medical advance directive? No - Patient declined Yes (ED - Information included in AVS)       Copy of Healthcare Power of Attorney in Chart? No - copy requested          Current Medications (verified) Outpatient Encounter Medications as of 09/07/2024  Medication Sig   ARIPiprazole  (ABILIFY ) 10 MG tablet Take 5 mg by mouth daily.   mirtazapine  (REMERON ) 30 MG tablet Take 30 mg by mouth at bedtime.   propranolol (INDERAL) 10 MG tablet Take by mouth.   terbinafine  (LAMISIL ) 250 MG tablet Take 1 tablet (250 mg total) by mouth daily. (Patient not taking: Reported on 09/07/2024)   No facility-administered encounter medications on file as of 09/07/2024.    Allergies (verified) Patient has no known allergies.   History: Past Medical History:  Diagnosis Date   Anxiety    Autism    Autism    Bipolar 1 disorder (HCC)    Cancer (HCC)    B-cell Lymphoma [facility]   Depression    Past Surgical History:  Procedure Laterality Date   COLONOSCOPY WITH PROPOFOL  N/A 05/20/2023   Procedure: COLONOSCOPY WITH PROPOFOL ;  Surgeon: Jinny Carmine, MD;  Location: [facility] ENDOSCOPY;  Service: Endoscopy;  Laterality: N/A;   EYE SURGERY     Family History  Problem Relation Age of Onset   Diabetes Mother    Dementia Paternal Grandfather    Social History   Socioeconomic History   Marital status: Married    Spouse name: Juanita   Number  of children: 0   Years of education: Not on file   Highest education level: Not on file  Occupational History   Occupation: disability  Tobacco Use   Smoking status: Never   Smokeless tobacco: Never  Vaping Use   Vaping status: Never Used  Substance and Sexual Activity   Alcohol use: Never   Drug use: Never   Sexual activity: Yes    Birth control/protection: None  Other Topics Concern   Not on file  Social History Narrative   09/07/24 2 step children/pbt   Social Drivers of Health   Financial Resource  Strain: Medium Risk (09/07/2024)   Overall Financial Resource Strain (CARDIA)    Difficulty of Paying Living Expenses: Somewhat hard  Food Insecurity: No Food Insecurity (09/07/2024)   Hunger Vital Sign    Worried About Running Out of Food in the Last Year: Never true    Ran Out of Food in the Last Year: Never true  Transportation Needs: No Transportation Needs (09/07/2024)   PRAPARE - Administrator, Civil Service (Medical): No    Lack of Transportation (Non-Medical): No  Physical Activity: Sufficiently Active (09/07/2024)   Exercise Vital Sign    Days of Exercise per Week: 7 days    Minutes of Exercise per Session: 30 min  Stress: No Stress Concern Present (09/07/2024)   [employer] of Occupational Health - Occupational Stress Questionnaire    Feeling of Stress: Not at all  Social Connections: Moderately Integrated (09/07/2024)   Social Connection and Isolation Panel    Frequency of Communication with Friends and Family: More than three times a week    Frequency of Social Gatherings with Friends and Family: Once a week    Attends Religious Services: More than 4 times per year    Active Member of [employer] or Organizations: No    Attends Engineer, structural: Never    Marital Status: Married    Tobacco Counseling Counseling given: Not Answered    Clinical Intake:  Pre-visit preparation completed: Yes  Pain : No/denies pain     BMI - recorded: 19.9 Nutritional Status: BMI of 19-24  Normal Nutritional Risks: None Diabetes: No  Lab Results  Component Value Date   HGBA1C 5.4 09/24/2020   HGBA1C 5.5 05/09/2018     How often do you need to have someone help you when you read instructions, pamphlets, or other written materials from your doctor or pharmacy?: 1 - Never  Interpreter Needed?: No  Information entered by :: Vina Ned, CMA   Activities of Daily Living     09/07/2024    8:09 AM  In your present state of health, do you have any  difficulty performing the following activities:  Hearing? 0  Vision? 0  Difficulty concentrating or making decisions? 1  Comment sometimes  Walking or climbing stairs? 0  Dressing or bathing? 0  Doing errands, shopping? 1  Comment doesn't drive, wife takes to appointments  Preparing Food and eating ? N  Using the Toilet? N  In the past six months, have you accidently leaked urine? N  Do you have problems with loss of bowel control? N  Managing your Medications? N  Managing your Finances? Y  Comment wife Neurosurgeon or managing your Housekeeping? N    Patient Care Team: Donzella Lauraine SAILOR, DO as PCP - General (Family Medicine) Babara Call, MD as Consulting Physician (Hematology and Oncology)  I have updated your Care Teams any recent Medical  Services you may have received from other providers in the past year.     Assessment:   This is a routine wellness examination for [place].  Hearing/Vision screen Hearing Screening - Comments:: Denies hearing loss  Vision Screening - Comments:: Gets routine eye exams, [facility], [place]   Goals Addressed             This Visit's Progress    Patient Stated       Improve       Depression Screen     09/07/2024    8:15 AM 09/18/2023    8:20 AM 09/18/2023    8:19 AM 09/07/2023    8:28 AM 11/26/2022    8:33 AM 09/24/2020    1:25 PM  PHQ 2/9 Scores  PHQ - 2 Score 0 2 2 4 3 6   PHQ- 9 Score 2 9 9 7 8 19     Fall Risk     09/07/2024    8:21 AM 09/04/2023   12:01 PM 11/26/2022    8:53 AM 11/26/2022    8:33 AM  Fall Risk   Falls in the past year? 0 0 0 0  Number falls in past yr: 0 0 0 0  Injury with Fall? 0 0 0 0  Risk for fall due to : No Fall Risks No Fall Risks  No Fall Risks  Follow up Falls evaluation completed Education provided;Falls prevention discussed  Falls evaluation completed      Data saved with a previous flowsheet row definition    MEDICARE RISK AT HOME:  Medicare Risk  at Home Any stairs in or around the home?: Yes If so, are there any without handrails?: No Home free of loose throw rugs in walkways, pet beds, electrical cords, etc?: Yes Adequate lighting in your home to reduce risk of falls?: Yes Life alert?: No Use of a cane, walker or w/c?: No Grab bars in the bathroom?: No Shower chair or bench in shower?: Yes Elevated toilet seat or a handicapped toilet?: No  TIMED UP AND GO:  Was the test performed?  No  Cognitive Function: 6CIT completed        09/07/2024    8:22 AM 09/07/2023    8:33 AM  6CIT Screen  What Year? 0 points 0 points  What month? 0 points 0 points  What time? 0 points 0 points  Count back from 20 0 points 0 points  Months in reverse 0 points 0 points  Repeat phrase 0 points 0 points  Total Score 0 points 0 points    Immunizations Immunization History  Administered Date(s) Administered   Influenza,inj,Quad PF,6+ Mos 08/05/2022   Influenza-Unspecified 09/02/2018, 08/22/2020   Moderna Covid-19 Vaccine Bivalent Booster 68yrs & up 09/06/2021   PFIZER(Purple Top)SARS-COV-2 Vaccination 02/09/2020, 03/01/2020, 09/23/2020   Pfizer Covid-19 Vaccine Bivalent Booster 26yrs & up 05/29/2021   Pfizer(Comirnaty)Fall Seasonal Vaccine 12 years and older 09/05/2022    Screening Tests Health Maintenance  Topic Date Due   DTaP/Tdap/Td (1 - Tdap) Never done   Hepatitis B Vaccines 19-59 Average Risk (1 of 3 - 19+ 3-dose series) Never done   Zoster Vaccines- Shingrix (1 of 2) Never done   Pneumococcal Vaccine: 50+ Years (1 of 1 - PCV) Never done   Influenza Vaccine  07/08/2024   COVID-19 Vaccine (7 - 2025-26 season) 08/08/2024   Medicare Annual Wellness (AWV)  09/07/2025   Colonoscopy  05/19/2033   Hepatitis C Screening  Completed   HIV Screening  Completed  HPV VACCINES  Aged Out   Meningococcal B Vaccine  Aged Out   Fecal DNA (Cologuard)  Discontinued    Health Maintenance Items Addressed: See Nurse Notes at the end of  this note  Additional Screening:  Vision Screening: Recommended annual ophthalmology exams for early detection of glaucoma and other disorders of the eye. Is the patient up to date with their annual eye exam?  Yes  Who is the provider or what is the name of the office in which the patient attends annual eye exams? [facility]  Dental Screening: Recommended annual dental exams for proper oral hygiene  Community Resource Referral / Chronic Care Management: CRR required this visit?  No   CCM required this visit?  No   Plan:    I have personally reviewed and noted the following in the patient's chart:   Medical and social history Use of alcohol, tobacco or illicit drugs  Current medications and supplements including opioid prescriptions. Patient is not currently taking opioid prescriptions. Functional ability and status Nutritional status Physical activity Advanced directives List of other physicians Hospitalizations, surgeries, and ER visits in previous 12 months Vitals Screenings to include cognitive, depression, and falls Referrals and appointments  In addition, I have reviewed and discussed with patient certain preventive protocols, quality metrics, and best practice recommendations. A written personalized care plan for preventive services as well as general preventive health recommendations were provided to patient.   Vina Ned, CMA   09/07/2024   After Visit Summary: (Mail) Due to this being a telephonic visit, the after visit summary with patients personalized plan was offered to patient via mail   Notes:  Scheduled OV for 09/26/24 (last seen 09/18/23) Plans to get flu vaccine Declined Covid, Tdap, Shingles and pneumonia vaccines

## 2024-09-07 NOTE — Patient Instructions (Signed)
 Steven Knight,  Thank you for taking the time for your Medicare Wellness Visit. I appreciate your continued commitment to your health goals. Please review the care plan we discussed, and feel free to reach out if I can assist you further.  Medicare recommends these wellness visits once per year to help you and your care team stay ahead of potential health issues. These visits are designed to focus on prevention, allowing your provider to concentrate on managing your acute and chronic conditions during your regular appointments.  Please note that Annual Wellness Visits do not include a physical exam. Some assessments may be limited, especially if the visit was conducted virtually. If needed, we may recommend a separate in-person follow-up with your provider.  Ongoing Care Seeing your primary care provider every 3 to 6 months helps us  monitor your health and provide consistent, personalized care. I have scheduled you an appointment with Dr. Donzella for 09/26/24 @ 2:40pm.  Referrals If a referral was made during today's visit and you haven't received any updates within two weeks, please contact the referred provider directly to check on the status.  Recommended Screenings: Get the flu vaccine at your convenience.  Health Maintenance  Topic Date Due   DTaP/Tdap/Td vaccine (1 - Tdap) Never done   Hepatitis B Vaccine (1 of 3 - 19+ 3-dose series) Never done   Zoster (Shingles) Vaccine (1 of 2) Never done   Pneumococcal Vaccine for age over 3 (1 of 1 - PCV) Never done   Flu Shot  07/08/2024   COVID-19 Vaccine (7 - 2025-26 season) 08/08/2024   Medicare Annual Wellness Visit  09/07/2025   Colon Cancer Screening  05/19/2033   Hepatitis C Screening  Completed   HIV Screening  Completed   HPV Vaccine  Aged Out   Meningitis B Vaccine  Aged Out   Cologuard (Stool DNA test)  Discontinued       09/07/2024    8:19 AM  Advanced Directives  Does Patient Have a Medical Advance Directive? Yes  Type of  Estate agent of [place];Living will  Does patient want to make changes to medical advance directive? No - Patient declined  Copy of Healthcare Power of Attorney in Chart? No - copy requested   Advance Care Planning is important because it: Ensures you receive medical care that aligns with your values, goals, and preferences. Provides guidance to your family and loved ones, reducing the emotional burden of decision-making during critical moments.  Vision: Annual vision screenings are recommended for early detection of glaucoma, cataracts, and diabetic retinopathy. These exams can also reveal signs of chronic conditions such as diabetes and high blood pressure.  Dental: Annual dental screenings help detect early signs of oral cancer, gum disease, and other conditions linked to overall health, including heart disease and diabetes.  Please see the attached documents for additional preventive care recommendations.   Fall Prevention in the Home, Adult Falls can cause injuries and affect people of all ages. There are many simple things that you can do to make your home safe and to help prevent falls. If you need it, ask for help making these changes. What actions can I take to prevent falls? General information Use good lighting in all rooms. Make sure to: Replace any light bulbs that burn out. Turn on lights if it is dark and use night-lights. Keep items that you use often in easy-to-reach places. Lower the shelves around your home if needed. Move furniture so that there are clear  paths around it. Do not keep throw rugs or other things on the floor that can make you trip. If any of your floors are uneven, fix them. Add color or contrast paint or tape to clearly mark and help you see: Grab bars or handrails. First and last steps of staircases. Where the edge of each step is. If you use a ladder or stepladder: Make sure that it is fully opened. Do not climb a closed  ladder. Make sure the sides of the ladder are locked in place. Have someone hold the ladder while you use it. Know where your pets are as you move through your home. What can I do in the bathroom?     Keep the floor dry. Clean up any water that is on the floor right away. Remove soap buildup in the bathtub or shower. Buildup makes bathtubs and showers slippery. Use non-skid mats or decals on the floor of the bathtub or shower. Attach bath mats securely with double-sided, non-slip rug tape. If you need to sit down while you are in the shower, use a non-slip stool. Install grab bars by the toilet and in the bathtub and shower. Do not use towel bars as grab bars. What can I do in the bedroom? Make sure that you have a light by your bed that is easy to reach. Do not use any sheets or blankets on your bed that hang to the floor. Have a firm bench or chair with side arms that you can use for support when you get dressed. What can I do in the kitchen? Clean up any spills right away. If you need to reach something above you, use a sturdy step stool that has a grab bar. Keep electrical cables out of the way. Do not use floor polish or wax that makes floors slippery. What can I do with my stairs? Do not leave anything on the stairs. Make sure that you have a light switch at the top and the bottom of the stairs. Have them installed if you do not have them. Make sure that there are handrails on both sides of the stairs. Fix handrails that are broken or loose. Make sure that handrails are as long as the staircases. Install non-slip stair treads on all stairs in your home if they do not have carpet. Avoid having throw rugs at the top or bottom of stairs, or secure the rugs with carpet tape to prevent them from moving. Choose a carpet design that does not hide the edge of steps on the stairs. Make sure that carpet is firmly attached to the stairs. Fix any carpet that is loose or worn. What can I do on  the outside of my home? Use bright outdoor lighting. Repair the edges of walkways and driveways and fix any cracks. Clear paths of anything that can make you trip, such as tools or rocks. Add color or contrast paint or tape to clearly mark and help you see high doorway thresholds. Trim any bushes or trees on the main path into your home. Check that handrails are securely fastened and in good repair. Both sides of all steps should have handrails. Install guardrails along the edges of any raised decks or porches. Have leaves, snow, and ice cleared regularly. Use sand, salt, or ice melt on walkways during winter months if you live where there is ice and snow. In the garage, clean up any spills right away, including grease or oil spills. What other actions can I take?  Review your medicines with your health care provider. Some medicines can make you confused or feel dizzy. This can increase your chance of falling. Wear closed-toe shoes that fit well and support your feet. Wear shoes that have rubber soles and low heels. Use a cane, walker, scooter, or crutches that help you move around if needed. Talk with your provider about other ways that you can decrease your risk of falls. This may include seeing a physical therapist to learn to do exercises to improve movement and strength. Where to find more information Centers for Disease Control and Prevention, STEADI: TonerPromos.no [employer] on Aging: BaseRingTones.pl National Institute on Aging: BaseRingTones.pl Contact a health care provider if: You are afraid of falling at home. You feel weak, drowsy, or dizzy at home. You fall at home. Get help right away if you: Lose consciousness or have trouble moving after a fall. Have a fall that causes a head injury. These symptoms may be an emergency. Get help right away. Call 911. Do not wait to see if the symptoms will go away. Do not drive yourself to the hospital. This information is not intended to replace  advice given to you by your health care provider. Make sure you discuss any questions you have with your health care provider. Document Revised: 07/28/2022 Document Reviewed: 07/28/2022 Elsevier Patient Education  2024 [employer].

## 2024-09-26 ENCOUNTER — Ambulatory Visit (INDEPENDENT_AMBULATORY_CARE_PROVIDER_SITE_OTHER): Admitting: Family Medicine

## 2024-09-26 ENCOUNTER — Encounter: Payer: Self-pay | Admitting: Family Medicine

## 2024-09-26 VITALS — BP 133/87 | HR 102 | Temp 97.7°F | Ht 74.0 in | Wt 172.6 lb

## 2024-09-26 DIAGNOSIS — F3132 Bipolar disorder, current episode depressed, moderate: Secondary | ICD-10-CM | POA: Diagnosis not present

## 2024-09-26 DIAGNOSIS — D693 Immune thrombocytopenic purpura: Secondary | ICD-10-CM

## 2024-09-26 DIAGNOSIS — R7689 Other specified abnormal immunological findings in serum: Secondary | ICD-10-CM | POA: Diagnosis not present

## 2024-09-26 DIAGNOSIS — R569 Unspecified convulsions: Secondary | ICD-10-CM | POA: Diagnosis not present

## 2024-09-26 DIAGNOSIS — Z0000 Encounter for general adult medical examination without abnormal findings: Secondary | ICD-10-CM | POA: Diagnosis not present

## 2024-09-26 DIAGNOSIS — D479 Neoplasm of uncertain behavior of lymphoid, hematopoietic and related tissue, unspecified: Secondary | ICD-10-CM | POA: Diagnosis not present

## 2024-09-26 DIAGNOSIS — Z136 Encounter for screening for cardiovascular disorders: Secondary | ICD-10-CM | POA: Diagnosis not present

## 2024-09-26 DIAGNOSIS — F845 Asperger's syndrome: Secondary | ICD-10-CM

## 2024-09-26 DIAGNOSIS — D472 Monoclonal gammopathy: Secondary | ICD-10-CM

## 2024-09-26 MED ORDER — COVID-19 MRNA VAC-TRIS(PFIZER) 30 MCG/0.3ML IM SUSY: 0.3000 mL | PREFILLED_SYRINGE | Freq: Once | INTRAMUSCULAR | 0 refills | Status: AC

## 2024-09-26 NOTE — Patient Instructions (Signed)
 Recommended vaccines: Tdap (tetanus, diphtheria and pertussis), hepatitis B (Heplisav-B), pneumonia (Prevnar 20 or 21), COVID and second shingles vaccine.

## 2024-09-26 NOTE — Assessment & Plan Note (Signed)
 Noted, with recent worsening on blood work from May 2025.  Currently following with oncology, Dr. Babara and has repeat blood work scheduled for next month.  No acute concerns.  Defer to specialist management.

## 2024-09-26 NOTE — Assessment & Plan Note (Signed)
 Patient's wife presents to clinic today and facilitated the visit by elaborating on patient's responses.  She notes that he frequently will answer any simple yes or no answers.  No acute concerns.  Continue to monitor.

## 2024-09-26 NOTE — Assessment & Plan Note (Signed)
 Being re-evaluated by neuropsych. Patient's wife reports result will be available Monday. Defer to specialist management.

## 2024-09-26 NOTE — Assessment & Plan Note (Signed)
 Noted.  Patient has follow-up blood work ordered to be completed next month.  Following with oncology; defer to specialist management.

## 2024-09-26 NOTE — Assessment & Plan Note (Signed)
 Noted.  Patient's wife describes these absence-type seizures, specifically that he stares out into space when they occur.  He is not currently on any antiepileptics for these.  No acute concerns.  Continue to monitor.   - Patient is currently following with neurology; defer to specialist management.

## 2024-09-26 NOTE — Progress Notes (Signed)
 Established patient visit   Patient: Steven Knight   DOB: 10-05-1971   53 y.o. Male  MRN: [id number] Visit Date: 09/26/2024  Today's healthcare provider: LAURAINE LOISE BUOY, DO   Chief Complaint  Patient presents with   Follow-up    Patient is here because he stated that he has not seen his primary provider in a while.  Looks like the last provider that was seen was Kelly Cedar.  Patient expresses no concerns.  Stated that he just got the flu and shingles vaccines last week and doesn't want to overload himself with shots.   Subjective    HPI Steven Knight is a 53 year old male who presents for a physical exam. He is accompanied by his wife.  He recently received flu and shingles vaccines and plans to get the COVID vaccine in November. He was previously informed that he was not eligible for the COVID vaccine at [facility], despite having significant medical conditions.  This eligibility was determined with a questionnaire his wife filled out.  Patient was subsequently able to schedule him for the COVID-vaccine.  He has a history of bipolar disorder, currently under reevaluation, with a recent full neuropsychological workup pending results. He experienced a past psychotic episode leading to the bipolar disorder diagnosis.  He has monoclonal gammopathy of undetermined significance (MGUS) and is following up with his doctor for this condition.  He experiences a seizure-like disorder characterized by episodes of 'spacing out.' He is not on medication for this condition and has not had any recent episodes beyond his usual frequency.  His blood pressure was noted to be elevated prior to the appointment. His last blood work in May included B12 levels that were on the lower end of normal. He is taking calcium supplements.  He is currently on three medications: Propranolol 10 mg twice daily, mirtazapine  30 mg daily at bedtime and aripiprazole  10 mg daily.  These are all  managed by behavioral health.      Medications: Outpatient Medications Prior to Visit  Medication Sig   ARIPiprazole  (ABILIFY ) 10 MG tablet Take 5 mg by mouth daily.   mirtazapine  (REMERON ) 30 MG tablet Take 30 mg by mouth at bedtime.   propranolol (INDERAL) 10 MG tablet Take by mouth.   [DISCONTINUED] terbinafine  (LAMISIL ) 250 MG tablet Take 1 tablet (250 mg total) by mouth daily. (Patient not taking: Reported on 09/07/2024)   No facility-administered medications prior to visit.    Review of Systems  Constitutional:  Negative for appetite change, chills, fatigue and fever.  HENT:  Negative for congestion, ear pain, hearing loss, nosebleeds and trouble swallowing.   Eyes:  Negative for pain and visual disturbance.  Respiratory:  Negative for cough, chest tightness and shortness of breath.   Cardiovascular:  Negative for chest pain, palpitations and leg swelling.  Gastrointestinal:  Negative for abdominal pain, blood in stool, constipation, diarrhea, nausea and vomiting.  Endocrine: Negative for polydipsia, polyphagia and polyuria.  Genitourinary:  Negative for dysuria and flank pain.  Musculoskeletal:  Negative for arthralgias, back pain, joint swelling, myalgias and neck stiffness.  Skin:  Negative for color change, rash and wound.  Neurological:  Negative for dizziness, tremors, seizures, speech difficulty, weakness, light-headedness and headaches.  Psychiatric/Behavioral:  Negative for behavioral problems, confusion, decreased concentration, dysphoric mood and sleep disturbance. The patient is not nervous/anxious.   All other systems reviewed and are negative.       Objective    BP 133/87 (  BP Location: Left Arm, Patient Position: Sitting, Cuff Size: Normal)   Pulse (!) 102   Temp 97.7 F (36.5 C) (Oral)   Ht 6' 2 (1.88 m)   Wt 172 lb 9.6 oz (78.3 kg)   SpO2 97%   BMI 22.16 kg/m     Physical Exam Vitals and nursing note reviewed.  Constitutional:      General: He  is awake.     Appearance: Normal appearance.  HENT:     Head: Normocephalic and atraumatic.     Right Ear: External ear normal. There is no impacted cerumen.     Left Ear: External ear normal. There is no impacted cerumen.     Ears:     Comments: Unable to visualize TMs bilaterally due to presence of cerumen.    Nose: Nose normal.     Mouth/Throat:     Mouth: Mucous membranes are moist.     Pharynx: Oropharynx is clear. No oropharyngeal exudate or posterior oropharyngeal erythema.  Eyes:     General: No scleral icterus.    Extraocular Movements: Extraocular movements intact.     Conjunctiva/sclera: Conjunctivae normal.     Comments: Pupils reactive to light; right pupil enlarged and irregular (at baseline for 15 years per patient).  Neck:     Thyroid : No thyromegaly or thyroid  tenderness.  Cardiovascular:     Rate and Rhythm: Normal rate and regular rhythm.     Pulses: Normal pulses.     Heart sounds: Normal heart sounds.  Pulmonary:     Effort: Pulmonary effort is normal. No tachypnea, bradypnea or respiratory distress.     Breath sounds: Normal breath sounds. No stridor. No wheezing, rhonchi or rales.  Abdominal:     General: Bowel sounds are normal. There is no distension.     Palpations: Abdomen is soft. There is no mass.     Tenderness: There is no abdominal tenderness. There is no guarding.     Hernia: No hernia is present.  Musculoskeletal:     Cervical back: Normal range of motion and neck supple.     Right lower leg: No edema.     Left lower leg: No edema.  Lymphadenopathy:     Cervical: No cervical adenopathy.  Skin:    General: Skin is warm and dry.  Neurological:     Mental Status: He is alert and oriented to person, place, and time. Mental status is at baseline.  Psychiatric:        Mood and Affect: Mood normal.        Behavior: Behavior normal.      No results found for any visits on 09/26/24.  Assessment & Plan    Annual physical exam  Bipolar I  disorder, moderate, current or most recent episode depressed, with melancholic features [facility]) Assessment & Plan: Being re-evaluated by neuropsych. Patient's wife reports result will be available Monday. Defer to specialist management.   B-cell lymphoproliferative disorder [facility]) Assessment & Plan: Previously received Rituximab .  Following with Dr. Babara; defer to specialist management.   Anti-TPO antibodies present -     TSH  Encounter for screening for cardiovascular disorders -     Lipid panel  Seizure-like activity (HCC) Assessment & Plan: Noted.  Patient's wife describes these absence-type seizures, specifically that he stares out into space when they occur.  He is not currently on any antiepileptics for these.  No acute concerns.  Continue to monitor.   - Patient is currently following with neurology; defer  to specialist management.   MGUS (monoclonal gammopathy of unknown significance) Assessment & Plan: Noted.  Patient has follow-up blood work ordered to be completed next month.  Following with oncology; defer to specialist management.   Asperger's disorder Assessment & Plan: Patient's wife presents to clinic today and facilitated the visit by elaborating on patient's responses.  She notes that he frequently will answer any simple yes or no answers.  No acute concerns.  Continue to monitor.     Immune thrombocytopenic purpura (HCC) Assessment & Plan: Noted, with recent worsening on blood work from May 2025.  Currently following with oncology, Dr. Babara and has repeat blood work scheduled for next month.  No acute concerns.  Defer to specialist management.   Other orders -     COVID-19 mRNA Vac-TriS(Pfizer); Inject 0.3 mLs into the muscle once for 1 dose.  Dispense: 0.3 mL; Refill: 0     Annual physical exam Routine adult wellness visit with no acute concerns.  Physical exam overall unremarkable except as noted above. Routine lab work ordered to be obtained with oncology  blood work next month.  Recent flu and shingles vaccinations. COVID-19 vaccination eligibility confirmed.  Significant cerumen noted to ears bilaterally, without impaction or symptoms of hearing loss or discomfort.  Discussed potential for home cerumen irrigation versus returning to clinic; they will be trying home irrigation and returning if needed. - Send prescription for [employer] COVID-19 vaccine, with option to substitute with Moderna. - Recommend hepatitis B, Tdap, and pneumonia vaccinations.    Return in about 1 year (around 09/26/2025) for CPE.      I discussed the assessment and treatment plan with the patient  The patient was provided an opportunity to ask questions and all were answered. The patient agreed with the plan and demonstrated an understanding of the instructions.   The patient was advised to call back or seek an in-person evaluation if the symptoms worsen or if the condition fails to improve as anticipated.    LAURAINE LOISE BUOY, DO  [facility] [phone] (phone) [phone] (fax)  [facility]

## 2024-09-26 NOTE — Assessment & Plan Note (Signed)
 Previously received Rituximab .  Following with Dr. Babara; defer to specialist management.

## 2024-10-18 ENCOUNTER — Other Ambulatory Visit: Payer: Self-pay | Admitting: Family Medicine

## 2024-10-19 LAB — TSH: TSH: 2.05 u[IU]/mL (ref 0.450–4.500)

## 2024-10-22 ENCOUNTER — Ambulatory Visit: Payer: Self-pay | Admitting: Family Medicine

## 2024-11-14 ENCOUNTER — Inpatient Hospital Stay: Attending: Oncology

## 2024-11-14 DIAGNOSIS — D693 Immune thrombocytopenic purpura: Secondary | ICD-10-CM | POA: Diagnosis not present

## 2024-11-14 DIAGNOSIS — C8510 Unspecified B-cell lymphoma, unspecified site: Secondary | ICD-10-CM | POA: Insufficient documentation

## 2024-11-14 DIAGNOSIS — F840 Autistic disorder: Secondary | ICD-10-CM | POA: Insufficient documentation

## 2024-11-14 DIAGNOSIS — F319 Bipolar disorder, unspecified: Secondary | ICD-10-CM | POA: Diagnosis not present

## 2024-11-14 DIAGNOSIS — Z79899 Other long term (current) drug therapy: Secondary | ICD-10-CM | POA: Diagnosis not present

## 2024-11-14 DIAGNOSIS — D479 Neoplasm of uncertain behavior of lymphoid, hematopoietic and related tissue, unspecified: Secondary | ICD-10-CM

## 2024-11-14 DIAGNOSIS — D472 Monoclonal gammopathy: Secondary | ICD-10-CM | POA: Insufficient documentation

## 2024-11-14 DIAGNOSIS — R161 Splenomegaly, not elsewhere classified: Secondary | ICD-10-CM | POA: Diagnosis not present

## 2024-11-14 LAB — CBC WITH DIFFERENTIAL (CANCER CENTER ONLY)
Abs Immature Granulocytes: 0.3 K/uL — ABNORMAL HIGH (ref 0.00–0.07)
Basophils Absolute: 0.1 K/uL (ref 0.0–0.1)
Basophils Relative: 1 %
Eosinophils Absolute: 0 K/uL (ref 0.0–0.5)
Eosinophils Relative: 1 %
HCT: 38.7 % — ABNORMAL LOW (ref 39.0–52.0)
Hemoglobin: 13.2 g/dL (ref 13.0–17.0)
Immature Granulocytes: 5 %
Lymphocytes Relative: 36 %
Lymphs Abs: 2.3 K/uL (ref 0.7–4.0)
MCH: 29.9 pg (ref 26.0–34.0)
MCHC: 34.1 g/dL (ref 30.0–36.0)
MCV: 87.6 fL (ref 80.0–100.0)
Monocytes Absolute: 0.8 K/uL (ref 0.1–1.0)
Monocytes Relative: 13 %
Neutro Abs: 2.9 K/uL (ref 1.7–7.7)
Neutrophils Relative %: 44 %
Platelet Count: 48 K/uL — ABNORMAL LOW (ref 150–400)
RBC: 4.42 MIL/uL (ref 4.22–5.81)
RDW: 13 % (ref 11.5–15.5)
WBC Count: 6.4 K/uL (ref 4.0–10.5)
nRBC: 0 % (ref 0.0–0.2)

## 2024-11-14 LAB — CMP (CANCER CENTER ONLY)
ALT: 20 U/L (ref 0–44)
AST: 22 U/L (ref 15–41)
Albumin: 4.3 g/dL (ref 3.5–5.0)
Alkaline Phosphatase: 61 U/L (ref 38–126)
Anion gap: 9 (ref 5–15)
BUN: 18 mg/dL (ref 6–20)
CO2: 28 mmol/L (ref 22–32)
Calcium: 9.3 mg/dL (ref 8.9–10.3)
Chloride: 105 mmol/L (ref 98–111)
Creatinine: 0.97 mg/dL (ref 0.61–1.24)
GFR, Estimated: >60 mL/min (ref >60)
Glucose, Bld: 78 mg/dL (ref 70–99)
Potassium: 4.2 mmol/L (ref 3.5–5.1)
Sodium: 142 mmol/L (ref 135–145)
Total Bilirubin: 1.1 mg/dL (ref 0.0–1.2)
Total Protein: 6.5 g/dL (ref 6.5–8.1)

## 2024-11-14 LAB — VITAMIN B12: Vitamin B-12: 598 pg/mL (ref 180–914)

## 2024-11-14 LAB — IMMATURE PLATELET FRACTION: Immature Platelet Fraction: 14.4 % — ABNORMAL HIGH (ref 1.2–8.6)

## 2024-11-15 LAB — KAPPA/LAMBDA LIGHT CHAINS
Kappa free light chain: 25.5 mg/L — ABNORMAL HIGH (ref 3.3–19.4)
Kappa, lambda light chain ratio: 1.2 (ref 0.26–1.65)
Lambda free light chains: 21.3 mg/L (ref 5.7–26.3)

## 2024-11-16 LAB — MULTIPLE MYELOMA PANEL, SERUM
Albumin SerPl Elph-Mcnc: 3.7 g/dL (ref 2.9–4.4)
Albumin/Glob SerPl: 1.5 (ref 0.7–1.7)
Alpha 1: 0.2 g/dL (ref 0.0–0.4)
Alpha2 Glob SerPl Elph-Mcnc: 0.6 g/dL (ref 0.4–1.0)
B-Globulin SerPl Elph-Mcnc: 0.7 g/dL (ref 0.7–1.3)
Gamma Glob SerPl Elph-Mcnc: 1 g/dL (ref 0.4–1.8)
Globulin, Total: 2.5 g/dL (ref 2.2–3.9)
IgA: 91 mg/dL (ref 90–386)
IgG (Immunoglobin G), Serum: 898 mg/dL (ref 603–1613)
IgM (Immunoglobulin M), Srm: 296 mg/dL — ABNORMAL HIGH (ref 20–172)
M Protein SerPl Elph-Mcnc: 0.2 g/dL — ABNORMAL HIGH
Total Protein ELP: 6.2 g/dL (ref 6.0–8.5)

## 2024-11-17 ENCOUNTER — Inpatient Hospital Stay: Admitting: Oncology

## 2024-11-17 ENCOUNTER — Encounter: Payer: Self-pay | Admitting: Oncology

## 2024-11-17 ENCOUNTER — Other Ambulatory Visit

## 2024-11-17 VITALS — BP 115/75 | HR 81 | Temp 97.5°F | Resp 18 | Wt 169.5 lb

## 2024-11-17 DIAGNOSIS — D472 Monoclonal gammopathy: Secondary | ICD-10-CM | POA: Diagnosis not present

## 2024-11-17 DIAGNOSIS — C8510 Unspecified B-cell lymphoma, unspecified site: Secondary | ICD-10-CM | POA: Diagnosis not present

## 2024-11-17 DIAGNOSIS — D693 Immune thrombocytopenic purpura: Secondary | ICD-10-CM | POA: Diagnosis not present

## 2024-11-17 DIAGNOSIS — D479 Neoplasm of uncertain behavior of lymphoid, hematopoietic and related tissue, unspecified: Secondary | ICD-10-CM | POA: Diagnosis not present

## 2024-11-17 NOTE — Assessment & Plan Note (Signed)
Stable M protein. Observation.

## 2024-11-17 NOTE — Progress Notes (Signed)
 Hematology/Oncology Progress note [phone] [phone]      Patient Care Team: Donzella Lauraine SAILOR, DO as PCP - General (Family Medicine) Babara Call, MD as Consulting Physician (Oncology) [facility]  ASSESSMENT & PLAN:   B-cell lymphoproliferative disorder (HCC) IgM MGUS, monoclonal lymphocytosis,  Previously status post weekly rituximab  x 4   Patient is clinically doing well. No weight loss or night sweats Continue observation.    MGUS (monoclonal gammopathy of unknown significance) Stable M protein. Observation.   Immune thrombocytopenic purpura (HCC) #Thrombocytopenia, due to peripheral destruction/sequestion or consumption.  primary ITP versus secondary ITP related to underlying B-cell lymphoproliferative disorder.  See below. Previous bone marrow biopsy indicates underlying B-cell lymphoproliferative disorder. + B-cell rearrangement, - MYD88, - t(11,18) Cytogenetics is normal.  Labs are reviewed and discussed with patient.   Thrombocytopenia has slightly decreased, now below 50,000. No bleeding events.  Continue observation. Patient and wife know to contact office if he develops active bleeding or petechiae.    Orders Placed This Encounter  Procedures   CBC with Differential (Cancer Center Only)    Standing Status:   Future    Expected Date:   05/18/2025    Expiration Date:   08/16/2025   CMP (Cancer Center only)    Standing Status:   Future    Expected Date:   05/18/2025    Expiration Date:   08/16/2025   Immature Platelet Fraction    Standing Status:   Future    Expected Date:   05/18/2025    Expiration Date:   08/16/2025   Vitamin B12    Standing Status:   Future    Expected Date:   05/18/2025    Expiration Date:   08/16/2025   Kappa/lambda light chains    Standing Status:   Future    Expected Date:   05/18/2025    Expiration Date:   08/16/2025   Multiple Myeloma Panel (SPEP&IFE w/QIG)    Standing Status:   Future    Expected Date:   05/18/2025     Expiration Date:   08/16/2025   CBC with Differential (Cancer Center Only)    Standing Status:   Future    Expected Date:   02/15/2025    Expiration Date:   05/16/2025   Sample to Blood Bank    Standing Status:   Future    Expected Date:   02/15/2025    Expiration Date:   05/16/2025   ABO/Rh    Standing Status:   Future    Expected Date:   02/15/2025    Expiration Date:   05/16/2025   Repeat cbc in 3 months Follow up in 6 months.  All questions were answered. The patient knows to call the clinic with any problems, questions or concerns.  Call Babara, MD, PhD [facility] Hematology Oncology 11/17/2024   REASON FOR VISIT Follow up for treatment of thrombocytopenia,  low grade B cell lymphoma, splenomegaly  HISTORY OF PRESENTING ILLNESS:  Steven Knight is a  53 y.o.  male presents for follow up of thrombocytopenia, low grade B cell lymphoma. splenomegaly Patient has autism and bipolar disease.  Poor historian Off Depakote .   History is obtained from wife.  # had a course of 4 days of dexamethasone  and the platelet count normalized to 160.000 # 11/27/20 - 12/19/2020 weekly treatments of rituximab  x 4.  His Abilify  dose has been lowered recently, and started on lamotrigine for about 2 months.  INTERVAL HISTORY Steven Knight is  a 53 y.o. male who has above history reviewed by me today presents for follow-up of thrombocytopenia, low-grade B-cell lymphoproliferative disorder He was accompanied by his wife.  No new complaints. No active bleeding events.  Patient has good appetite.   Patient has gained some weight.   .Review of Systems  Unable to perform ROS: Psychiatric disorder  Constitutional:  Negative for fever, malaise/fatigue and weight loss.  Respiratory:  Negative for cough.   Cardiovascular:  Negative for palpitations and leg swelling.  Gastrointestinal:  Negative for nausea and vomiting.  Genitourinary:  Negative for urgency.  Skin:  Negative for rash.   Neurological:  Negative for dizziness.  Endo/Heme/Allergies:  Does not bruise/bleed easily.  Psychiatric/Behavioral:  The patient is not nervous/anxious.     MEDICAL HISTORY:  Past Medical History:  Diagnosis Date   Anxiety    Autism    Autism    Bipolar 1 disorder (HCC)    Cancer (HCC)    B-cell Lymphoma Dr Babara [facility]   Cancer [facility])    Depression     SURGICAL HISTORY: Past Surgical History:  Procedure Laterality Date   COLONOSCOPY WITH PROPOFOL  N/A 05/20/2023   Procedure: COLONOSCOPY WITH PROPOFOL ;  Surgeon: Jinny Carmine, MD;  Location: [facility] ENDOSCOPY;  Service: Endoscopy;  Laterality: N/A;   EYE SURGERY     No surgery SOCIAL HISTORY: Social History   Socioeconomic History   Marital status: Married    Spouse name: Juanita   Number of children: 0   Years of education: Not on file   Highest education level: Not on file  Occupational History   Occupation: disability  Tobacco Use   Smoking status: Never   Smokeless tobacco: Never  Vaping Use   Vaping status: Never Used  Substance and Sexual Activity   Alcohol use: Never   Drug use: Never   Sexual activity: Yes    Birth control/protection: None  Other Topics Concern   Not on file  Social History Narrative   09/07/24 2 step children/pbt   Social Drivers of Health   Tobacco Use: Low Risk (11/17/2024)   Patient History    Smoking Tobacco Use: Never    Smokeless Tobacco Use: Never    Passive Exposure: Not on file  Financial Resource Strain: Medium Risk (09/07/2024)   Overall Financial Resource Strain (CARDIA)    Difficulty of Paying Living Expenses: Somewhat hard  Food Insecurity: No Food Insecurity (09/07/2024)   Epic    Worried About Programme Researcher, Broadcasting/film/video in the Last Year: Never true    Ran Out of Food in the Last Year: Never true  Transportation Needs: No Transportation Needs (09/07/2024)   Epic    Lack of Transportation (Medical): No    Lack of Transportation (Non-Medical): No  Physical  Activity: Sufficiently Active (09/07/2024)   Exercise Vital Sign    Days of Exercise per Week: 7 days    Minutes of Exercise per Session: 30 min  Stress: No Stress Concern Present (09/07/2024)   [employer] of Occupational Health - Occupational Stress Questionnaire    Feeling of Stress: Not at all  Social Connections: Moderately Integrated (09/07/2024)   Social Connection and Isolation Panel    Frequency of Communication with Friends and Family: More than three times a week    Frequency of Social Gatherings with Friends and Family: Once a week    Attends Religious Services: More than 4 times per year    Active Member of Clubs or Organizations: No  Attends Banker Meetings: Never    Marital Status: Married  Catering Manager Violence: Not At Risk (09/07/2024)   Epic    Fear of Current or Ex-Partner: No    Emotionally Abused: No    Physically Abused: No    Sexually Abused: No  Depression (PHQ2-9): High Risk (09/26/2024)   Depression (PHQ2-9)    PHQ-2 Score: 14  Alcohol Screen: Low Risk (09/07/2024)   Alcohol Screen    Last Alcohol Screening Score (AUDIT): 0  Housing: Unknown (09/19/2024)   Received from [facility]   Epic    Unable to Pay for Housing in the Last Year: Not on file    Number of Times Moved in the Last Year: Not on file    At any time in the past 12 months, were you homeless or living in a shelter (including now)?: No  Utilities: Not At Risk (09/07/2024)   Epic    Threatened with loss of utilities: No  Health Literacy: Adequate Health Literacy (09/07/2024)   B1300 Health Literacy    Frequency of need for help with medical instructions: Never    FAMILY HISTORY: Family History  Problem Relation Age of Onset   Diabetes Mother    Dementia Paternal Grandfather     ALLERGIES:  has no known allergies.  MEDICATIONS:  Current Outpatient Medications  Medication Sig Dispense Refill   ARIPiprazole  (ABILIFY ) 10 MG tablet Take 5 mg by  mouth daily.     mirtazapine  (REMERON ) 30 MG tablet Take 30 mg by mouth at bedtime.     propranolol (INDERAL) 10 MG tablet Take by mouth.     No current facility-administered medications for this visit.     PHYSICAL EXAMINATION: ECOG PERFORMANCE STATUS: 1 - Symptomatic but completely ambulatory Vitals:   11/17/24 1425  BP: 115/75  Pulse: 81  Resp: 18  Temp: (!) 97.5 F (36.4 C)   Filed Weights   11/17/24 1425  Weight: 169 lb 8 oz (76.9 kg)    Physical Exam Constitutional:      General: He is not in acute distress. HENT:     Head: Normocephalic and atraumatic.  Eyes:     General: No scleral icterus.    Pupils: Pupils are equal, round, and reactive to light.  Cardiovascular:     Rate and Rhythm: Normal rate and regular rhythm.     Heart sounds: Normal heart sounds.  Pulmonary:     Effort: Pulmonary effort is normal. No respiratory distress.     Breath sounds: No wheezing.  Abdominal:     General: Bowel sounds are normal.     Palpations: Abdomen is soft.  Musculoskeletal:        General: No deformity. Normal range of motion.     Cervical back: Normal range of motion and neck supple.  Skin:    General: Skin is warm and dry.     Findings: No erythema or rash.  Neurological:     Mental Status: He is alert and oriented to person, place, and time.     Cranial Nerves: No cranial nerve deficit.     Coordination: Coordination normal.      LABORATORY DATA:  I have reviewed the data as listed Lab Results  Component Value Date   WBC 6.4 11/14/2024   HGB 13.2 11/14/2024   HCT 38.7 (L) 11/14/2024   MCV 87.6 11/14/2024   PLT 48 (L) 11/14/2024   Recent Labs    05/05/24 0755 11/14/24 0802  NA  139 142  K 3.9 4.2  CL 108 105  CO2 26 28  GLUCOSE 115* 78  BUN 24* 18  CREATININE 1.06 0.97  CALCIUM 8.5* 9.3  GFRNONAA >60 >60  PROT 6.6 6.5  ALBUMIN 4.2 4.3  AST 17 22  ALT 18 20  ALKPHOS 50 61  BILITOT 1.2 1.1   Iron/TIBC/Ferritin/ %Sat No results found for:  IRON, TIBC, FERRITIN, IRONPCTSAT    Normal LDH, negative hepatitis and HIV, normal B12 and folate level, normal spleen size. Negative flowcytometry. Patient has history of positive anti-TPO.  Patient was seen by rheumatologist last year.

## 2024-11-17 NOTE — Assessment & Plan Note (Signed)
#  Thrombocytopenia, due to peripheral destruction/sequestion or consumption.  primary ITP versus secondary ITP related to underlying B-cell lymphoproliferative disorder.  See below. Previous bone marrow biopsy indicates underlying B-cell lymphoproliferative disorder. + B-cell rearrangement, - MYD88, - t(11,18) Cytogenetics is normal.  Labs are reviewed and discussed with patient.   Thrombocytopenia has slightly decreased, now below 50,000. No bleeding events.  Continue observation. Patient and wife know to contact office if he develops active bleeding or petechiae.

## 2024-11-17 NOTE — Assessment & Plan Note (Addendum)
 IgM MGUS, monoclonal lymphocytosis,  Previously status post weekly rituximab  x 4   Patient is clinically doing well. No weight loss or night sweats Continue observation.

## 2025-02-15 ENCOUNTER — Inpatient Hospital Stay

## 2025-05-18 ENCOUNTER — Inpatient Hospital Stay

## 2025-05-25 ENCOUNTER — Inpatient Hospital Stay: Admitting: Oncology

## 2025-09-13 ENCOUNTER — Ambulatory Visit
